# Patient Record
Sex: Male | Born: 1965
Health system: Southern US, Community
[De-identification: ages and names within clinical notes are randomized; demographics above are authoritative.]

## PROBLEM LIST (undated history)

## (undated) DIAGNOSIS — J302 Other seasonal allergic rhinitis: Secondary | ICD-10-CM

## (undated) DIAGNOSIS — K219 Gastro-esophageal reflux disease without esophagitis: Secondary | ICD-10-CM

## (undated) DIAGNOSIS — C801 Malignant (primary) neoplasm, unspecified: Secondary | ICD-10-CM

## (undated) DIAGNOSIS — I4510 Unspecified right bundle-branch block: Secondary | ICD-10-CM

## (undated) DIAGNOSIS — I10 Essential (primary) hypertension: Secondary | ICD-10-CM

## (undated) HISTORY — DX: Essential (primary) hypertension: I10

## (undated) HISTORY — DX: Unspecified right bundle-branch block: I45.10

## (undated) HISTORY — DX: Gastro-esophageal reflux disease without esophagitis: K21.9

## (undated) HISTORY — PX: KNEE ARTHROSCOPY: SUR90

## (undated) HISTORY — DX: Other seasonal allergic rhinitis: J30.2

---

## 2005-03-15 ENCOUNTER — Ambulatory Visit: Payer: Self-pay | Admitting: Family Medicine

## 2005-03-22 ENCOUNTER — Ambulatory Visit: Payer: Self-pay | Admitting: Family Medicine

## 2005-08-17 ENCOUNTER — Ambulatory Visit: Payer: Self-pay | Admitting: Family Medicine

## 2005-08-17 ENCOUNTER — Encounter: Admission: RE | Admit: 2005-08-17 | Discharge: 2005-08-17 | Payer: Self-pay | Admitting: Family Medicine

## 2005-12-28 ENCOUNTER — Ambulatory Visit: Payer: Self-pay | Admitting: Family Medicine

## 2006-12-09 ENCOUNTER — Ambulatory Visit: Payer: Self-pay | Admitting: Family Medicine

## 2006-12-09 LAB — CONVERTED CEMR LAB
ALT: 43 units/L — ABNORMAL HIGH (ref 0–40)
AST: 31 units/L (ref 0–37)
Albumin: 4.2 g/dL (ref 3.5–5.2)
Alkaline Phosphatase: 61 units/L (ref 39–117)
BUN: 14 mg/dL (ref 6–23)
Basophils Absolute: 0.1 10*3/uL (ref 0.0–0.1)
Basophils Relative: 0.8 % (ref 0.0–1.0)
Bilirubin, Direct: 0.1 mg/dL (ref 0.0–0.3)
CO2: 29 meq/L (ref 19–32)
Calcium: 9.1 mg/dL (ref 8.4–10.5)
Chloride: 108 meq/L (ref 96–112)
Cholesterol: 194 mg/dL (ref 0–200)
Creatinine, Ser: 1.2 mg/dL (ref 0.4–1.5)
Eosinophils Absolute: 0.4 10*3/uL (ref 0.0–0.6)
Eosinophils Relative: 4.5 % (ref 0.0–5.0)
GFR calc Af Amer: 85 mL/min
GFR calc non Af Amer: 71 mL/min
Glucose, Bld: 97 mg/dL (ref 70–99)
HCT: 43.8 % (ref 39.0–52.0)
HDL: 41.3 mg/dL (ref >39.0)
Hemoglobin: 15.2 g/dL (ref 13.0–17.0)
LDL Cholesterol: 132 mg/dL — ABNORMAL HIGH (ref 0–99)
Lymphocytes Relative: 26.1 % (ref 12.0–46.0)
MCHC: 34.8 g/dL (ref 30.0–36.0)
MCV: 82.3 fL (ref 78.0–100.0)
Monocytes Absolute: 0.7 10*3/uL (ref 0.2–0.7)
Monocytes Relative: 8.5 % (ref 3.0–11.0)
Neutro Abs: 4.9 10*3/uL (ref 1.4–7.7)
Neutrophils Relative %: 60.1 % (ref 43.0–77.0)
Platelets: 329 10*3/uL (ref 150–400)
Potassium: 4.1 meq/L (ref 3.5–5.1)
RBC: 5.32 M/uL (ref 4.22–5.81)
RDW: 12 % (ref 11.5–14.6)
Sodium: 142 meq/L (ref 135–145)
TSH: 2.38 microintl units/mL (ref 0.35–5.50)
Total Bilirubin: 0.8 mg/dL (ref 0.3–1.2)
Total CHOL/HDL Ratio: 4.7
Total Protein: 7.6 g/dL (ref 6.0–8.3)
Triglycerides: 104 mg/dL (ref 0–149)
VLDL: 21 mg/dL (ref 0–40)
WBC: 8.3 10*3/uL (ref 4.5–10.5)

## 2006-12-16 ENCOUNTER — Ambulatory Visit: Payer: Self-pay | Admitting: Family Medicine

## 2006-12-16 LAB — CONVERTED CEMR LAB: PSA: 1.21 ng/mL (ref 0.10–4.00)

## 2008-08-06 ENCOUNTER — Telehealth: Payer: Self-pay | Admitting: Family Medicine

## 2008-09-02 ENCOUNTER — Ambulatory Visit: Payer: Self-pay | Admitting: Family Medicine

## 2008-09-02 LAB — CONVERTED CEMR LAB
Bilirubin Urine: NEGATIVE
Glucose, Urine, Semiquant: NEGATIVE
Ketones, urine, test strip: NEGATIVE
Nitrite: NEGATIVE
Protein, U semiquant: NEGATIVE
Specific Gravity, Urine: 1.02
Urobilinogen, UA: 0.2
WBC Urine, dipstick: NEGATIVE
pH: 6

## 2008-09-07 ENCOUNTER — Ambulatory Visit: Payer: Self-pay | Admitting: Family Medicine

## 2008-09-07 DIAGNOSIS — I10 Essential (primary) hypertension: Secondary | ICD-10-CM | POA: Insufficient documentation

## 2008-09-07 DIAGNOSIS — R7309 Other abnormal glucose: Secondary | ICD-10-CM | POA: Insufficient documentation

## 2008-09-07 DIAGNOSIS — E785 Hyperlipidemia, unspecified: Secondary | ICD-10-CM | POA: Insufficient documentation

## 2008-09-07 DIAGNOSIS — L408 Other psoriasis: Secondary | ICD-10-CM | POA: Insufficient documentation

## 2008-09-07 LAB — CONVERTED CEMR LAB
ALT: 48 units/L (ref 0–53)
AST: 32 units/L (ref 0–37)
Albumin: 4.1 g/dL (ref 3.5–5.2)
Alkaline Phosphatase: 72 units/L (ref 39–117)
BUN: 16 mg/dL (ref 6–23)
Basophils Absolute: 0 10*3/uL (ref 0.0–0.1)
Basophils Relative: 0.3 % (ref 0.0–3.0)
Bilirubin, Direct: 0.1 mg/dL (ref 0.0–0.3)
CO2: 30 meq/L (ref 19–32)
Calcium: 9.1 mg/dL (ref 8.4–10.5)
Chloride: 105 meq/L (ref 96–112)
Cholesterol: 212 mg/dL (ref 0–200)
Creatinine, Ser: 1.1 mg/dL (ref 0.4–1.5)
Direct LDL: 141.2 mg/dL
Eosinophils Absolute: 0.4 10*3/uL (ref 0.0–0.7)
Eosinophils Relative: 5.9 % — ABNORMAL HIGH (ref 0.0–5.0)
GFR calc Af Amer: 94 mL/min
GFR calc non Af Amer: 77 mL/min
Glucose, Bld: 115 mg/dL — ABNORMAL HIGH (ref 70–99)
HCT: 41.7 % (ref 39.0–52.0)
HDL: 41.5 mg/dL (ref >39.0)
Hemoglobin: 14.8 g/dL (ref 13.0–17.0)
Lymphocytes Relative: 29.1 % (ref 12.0–46.0)
MCHC: 35.4 g/dL (ref 30.0–36.0)
MCV: 83.6 fL (ref 78.0–100.0)
Monocytes Absolute: 0.6 10*3/uL (ref 0.1–1.0)
Monocytes Relative: 8.9 % (ref 3.0–12.0)
Neutro Abs: 3.8 10*3/uL (ref 1.4–7.7)
Neutrophils Relative %: 55.8 % (ref 43.0–77.0)
Platelets: 296 10*3/uL (ref 150–400)
Potassium: 4.2 meq/L (ref 3.5–5.1)
RBC: 4.99 M/uL (ref 4.22–5.81)
RDW: 12.6 % (ref 11.5–14.6)
Sodium: 142 meq/L (ref 135–145)
TSH: 3.4 microintl units/mL (ref 0.35–5.50)
Total Bilirubin: 1 mg/dL (ref 0.3–1.2)
Total CHOL/HDL Ratio: 5.1
Total Protein: 7.8 g/dL (ref 6.0–8.3)
Triglycerides: 81 mg/dL (ref 0–149)
VLDL: 16 mg/dL (ref 0–40)
WBC: 6.8 10*3/uL (ref 4.5–10.5)

## 2009-07-07 ENCOUNTER — Ambulatory Visit: Payer: Self-pay | Admitting: Family Medicine

## 2009-07-07 DIAGNOSIS — L259 Unspecified contact dermatitis, unspecified cause: Secondary | ICD-10-CM

## 2009-07-07 DIAGNOSIS — K602 Anal fissure, unspecified: Secondary | ICD-10-CM

## 2009-10-17 ENCOUNTER — Telehealth: Payer: Self-pay | Admitting: Family Medicine

## 2009-10-18 ENCOUNTER — Ambulatory Visit: Payer: Self-pay | Admitting: Family Medicine

## 2009-10-18 DIAGNOSIS — R109 Unspecified abdominal pain: Secondary | ICD-10-CM

## 2009-10-18 LAB — CONVERTED CEMR LAB
Glucose, Urine, Semiquant: NEGATIVE
Nitrite: NEGATIVE
Protein, U semiquant: 30
Specific Gravity, Urine: 1.01
Urobilinogen, UA: 2
WBC Urine, dipstick: NEGATIVE
pH: 6.5

## 2009-10-25 LAB — CONVERTED CEMR LAB
ALT: 54 units/L — ABNORMAL HIGH (ref 0–53)
AST: 46 units/L — ABNORMAL HIGH (ref 0–37)
Albumin: 3.5 g/dL (ref 3.5–5.2)
Alkaline Phosphatase: 87 units/L (ref 39–117)
Amylase: 69 units/L (ref 27–131)
BUN: 7 mg/dL (ref 6–23)
Basophils Absolute: 0 10*3/uL (ref 0.0–0.1)
Basophils Relative: 0.2 % (ref 0.0–3.0)
Bilirubin, Direct: 0.3 mg/dL (ref 0.0–0.3)
CO2: 28 meq/L (ref 19–32)
Calcium: 8.9 mg/dL (ref 8.4–10.5)
Chloride: 103 meq/L (ref 96–112)
Cholesterol: 153 mg/dL (ref 0–200)
Creatinine, Ser: 0.9 mg/dL (ref 0.4–1.5)
Eosinophils Absolute: 0.2 10*3/uL (ref 0.0–0.7)
Eosinophils Relative: 1.3 % (ref 0.0–5.0)
GFR calc non Af Amer: 96.63 mL/min (ref >60)
Glucose, Bld: 93 mg/dL (ref 70–99)
HCT: 41 % (ref 39.0–52.0)
HDL: 45.7 mg/dL (ref >39.00)
Hemoglobin: 14 g/dL (ref 13.0–17.0)
LDL Cholesterol: 94 mg/dL (ref 0–99)
Lymphocytes Relative: 14.6 % (ref 12.0–46.0)
Lymphs Abs: 2 10*3/uL (ref 0.7–4.0)
MCHC: 34.1 g/dL (ref 30.0–36.0)
MCV: 86.3 fL (ref 78.0–100.0)
Monocytes Absolute: 1.6 10*3/uL — ABNORMAL HIGH (ref 0.1–1.0)
Monocytes Relative: 11.5 % (ref 3.0–12.0)
Neutro Abs: 9.7 10*3/uL — ABNORMAL HIGH (ref 1.4–7.7)
Neutrophils Relative %: 72.4 % (ref 43.0–77.0)
Platelets: 321 10*3/uL (ref 150.0–400.0)
Potassium: 3.9 meq/L (ref 3.5–5.1)
RBC: 4.76 M/uL (ref 4.22–5.81)
RDW: 11.9 % (ref 11.5–14.6)
Sodium: 139 meq/L (ref 135–145)
TSH: 2.13 microintl units/mL (ref 0.35–5.50)
Total Bilirubin: 1.3 mg/dL — ABNORMAL HIGH (ref 0.3–1.2)
Total CHOL/HDL Ratio: 3
Total Protein: 7.7 g/dL (ref 6.0–8.3)
Triglycerides: 66 mg/dL (ref 0.0–149.0)
VLDL: 13.2 mg/dL (ref 0.0–40.0)
WBC: 13.5 10*3/uL — ABNORMAL HIGH (ref 4.5–10.5)

## 2010-03-30 ENCOUNTER — Ambulatory Visit: Payer: Self-pay | Admitting: Family Medicine

## 2010-03-30 LAB — CONVERTED CEMR LAB
Bilirubin Urine: NEGATIVE
Glucose, Urine, Semiquant: NEGATIVE
Ketones, urine, test strip: NEGATIVE
Nitrite: NEGATIVE
Specific Gravity, Urine: 1.025
Urobilinogen, UA: 0.2
WBC Urine, dipstick: NEGATIVE
pH: 6

## 2010-03-31 LAB — CONVERTED CEMR LAB
ALT: 27 units/L (ref 0–53)
AST: 25 units/L (ref 0–37)
Albumin: 4 g/dL (ref 3.5–5.2)
Alkaline Phosphatase: 60 units/L (ref 39–117)
BUN: 17 mg/dL (ref 6–23)
Basophils Absolute: 0.1 10*3/uL (ref 0.0–0.1)
Basophils Relative: 0.6 % (ref 0.0–3.0)
Bilirubin, Direct: 0.2 mg/dL (ref 0.0–0.3)
CO2: 23 meq/L (ref 19–32)
Calcium: 9 mg/dL (ref 8.4–10.5)
Chloride: 104 meq/L (ref 96–112)
Cholesterol: 201 mg/dL — ABNORMAL HIGH (ref 0–200)
Creatinine, Ser: 0.9 mg/dL (ref 0.4–1.5)
Direct LDL: 142.8 mg/dL
Eosinophils Absolute: 0.4 10*3/uL (ref 0.0–0.7)
Eosinophils Relative: 4.6 % (ref 0.0–5.0)
GFR calc non Af Amer: 100.29 mL/min (ref >60)
Glucose, Bld: 105 mg/dL — ABNORMAL HIGH (ref 70–99)
HCT: 43.1 % (ref 39.0–52.0)
HDL: 48.4 mg/dL (ref >39.00)
Hemoglobin: 14.8 g/dL (ref 13.0–17.0)
Lymphocytes Relative: 29.7 % (ref 12.0–46.0)
Lymphs Abs: 2.5 10*3/uL (ref 0.7–4.0)
MCHC: 34.3 g/dL (ref 30.0–36.0)
MCV: 85.8 fL (ref 78.0–100.0)
Monocytes Absolute: 0.8 10*3/uL (ref 0.1–1.0)
Monocytes Relative: 9.2 % (ref 3.0–12.0)
Neutro Abs: 4.7 10*3/uL (ref 1.4–7.7)
Neutrophils Relative %: 55.9 % (ref 43.0–77.0)
PSA: 1.32 ng/mL (ref 0.10–4.00)
Platelets: 316 10*3/uL (ref 150.0–400.0)
Potassium: 4.2 meq/L (ref 3.5–5.1)
RBC: 5.02 M/uL (ref 4.22–5.81)
RDW: 13 % (ref 11.5–14.6)
Sodium: 138 meq/L (ref 135–145)
TSH: 2.09 microintl units/mL (ref 0.35–5.50)
Total Bilirubin: 0.8 mg/dL (ref 0.3–1.2)
Total CHOL/HDL Ratio: 4
Total Protein: 7.1 g/dL (ref 6.0–8.3)
Triglycerides: 88 mg/dL (ref 0.0–149.0)
VLDL: 17.6 mg/dL (ref 0.0–40.0)
WBC: 8.3 10*3/uL (ref 4.5–10.5)

## 2010-04-06 ENCOUNTER — Ambulatory Visit: Payer: Self-pay | Admitting: Family Medicine

## 2010-04-06 DIAGNOSIS — L909 Atrophic disorder of skin, unspecified: Secondary | ICD-10-CM

## 2010-04-06 DIAGNOSIS — L919 Hypertrophic disorder of the skin, unspecified: Secondary | ICD-10-CM

## 2010-04-06 DIAGNOSIS — L989 Disorder of the skin and subcutaneous tissue, unspecified: Secondary | ICD-10-CM | POA: Insufficient documentation

## 2010-12-05 NOTE — Assessment & Plan Note (Signed)
Summary: CPX/CCM   Vital Signs:  Patient Profile:   45 Years Old Male Height:     68 inches Weight:      237 pounds BMI:     36.17 Temp:     98.0 degrees F oral Pulse rate:   78 / minute BP sitting:   132 / 82  (left arm) Cuff size:   large  Vitals Entered By: Alfred Levins, CMA (September 07, 2008 10:33 AM)                 Chief Complaint:  cpx.  History of Present Illness: 45 yr old male for cpx. Feels good. Says he watches what he eats, but it is apparent that he has a very poor diet. He walks on a treadmill 2 days a week.     Current Allergies (reviewed today): ! PENICILLIN  Past Medical History:    Reviewed history and no changes required:       Hypertension  Past Surgical History:    Reviewed history and no changes required:       Arthoscopy rt knee   Family History:    Reviewed history and no changes required:       Family History of CAD Male 1st degree relative <50       Family History Diabetes 1st degree relative       Family History High cholesterol       Family History Hypertension       Family History Lung cancer       Family History of Prostate CA 1st degree relative <50       Family History of Stroke M 1st degree relative <50  Social History:    Reviewed history and no changes required:       Married       Never Smoked       Alcohol use-yes       Drug use-no       Chews tobacco   Risk Factors:  Tobacco use:  never Drug use:  no Alcohol use:  yes   Review of Systems  The patient denies anorexia, fever, weight loss, weight gain, vision loss, decreased hearing, hoarseness, chest pain, syncope, dyspnea on exertion, peripheral edema, prolonged cough, headaches, hemoptysis, abdominal pain, melena, hematochezia, severe indigestion/heartburn, hematuria, incontinence, genital sores, muscle weakness, suspicious skin lesions, transient blindness, difficulty walking, depression, unusual weight change, abnormal bleeding, enlarged lymph nodes,  angioedema, breast masses, and testicular masses.     Physical Exam  General:     overweight-appearing.   Head:     Normocephalic and atraumatic without obvious abnormalities. No apparent alopecia or balding. Eyes:     No corneal or conjunctival inflammation noted. EOMI. Perrla. Funduscopic exam benign, without hemorrhages, exudates or papilledema. Vision grossly normal. Ears:     External ear exam shows no significant lesions or deformities.  Otoscopic examination reveals clear canals, tympanic membranes are intact bilaterally without bulging, retraction, inflammation or discharge. Hearing is grossly normal bilaterally. Nose:     External nasal examination shows no deformity or inflammation. Nasal mucosa are pink and moist without lesions or exudates. Mouth:     Oral mucosa and oropharynx without lesions or exudates.  Teeth in good repair. Neck:     No deformities, masses, or tenderness noted. Chest Wall:     No deformities, masses, tenderness or gynecomastia noted. Lungs:     Normal respiratory effort, chest expands symmetrically. Lungs are clear to auscultation, no crackles or  wheezes. Heart:     Normal rate and regular rhythm. S1 and S2 normal without gallop, murmur, click, rub or other extra sounds. Abdomen:     Bowel sounds positive,abdomen soft and non-tender without masses, organomegaly or hernias noted. Rectal:     No external abnormalities noted. Normal sphincter tone. No rectal masses or tenderness. Genitalia:     Testes bilaterally descended without nodularity, tenderness or masses. No scrotal masses or lesions. No penis lesions or urethral discharge. Prostate:     Prostate gland firm and smooth, no enlargement, nodularity, tenderness, mass, asymmetry or induration. Msk:     No deformity or scoliosis noted of thoracic or lumbar spine.   Pulses:     R and L carotid,radial,femoral,dorsalis pedis and posterior tibial pulses are full and equal bilaterally Extremities:      No clubbing, cyanosis, edema, or deformity noted with normal full range of motion of all joints.   Neurologic:     No cranial nerve deficits noted. Station and gait are normal. Plantar reflexes are down-going bilaterally. DTRs are symmetrical throughout. Sensory, motor and coordinative functions appear intact. Skin:     Intact without suspicious lesions or rashes Cervical Nodes:     No lymphadenopathy noted Axillary Nodes:     No palpable lymphadenopathy Inguinal Nodes:     No significant adenopathy Psych:     Cognition and judgment appear intact. Alert and cooperative with normal attention span and concentration. No apparent delusions, illusions, hallucinations    Impression & Recommendations:  Problem # 1:  WELL ADULT EXAM (ICD-V70.0)  Problem # 2:  HYPERLIPIDEMIA (ICD-272.4)  Orders: Nutrition Referral (Nutrition)   Problem # 3:  HYPERGLYCEMIA (ICD-790.29)  Problem # 4:  PSORIASIS (ICD-696.1)  Complete Medication List: 1)  Norvasc 5 Mg Tabs (Amlodipine besylate) .Marland Kitchen.. 1 by mouth once daily 2)  Fluticasone Propionate 0.005 % Oint (Fluticasone propionate) .... Apply two times a day as needed rash  Other Orders: Venipuncture (60454) TLB-PSA (Prostate Specific Antigen) (84153-PSA)   Patient Instructions: 1)  It is important that you exercise regularly at least 20 minutes 5 times a week. If you develop chest pain, have severe difficulty breathing, or feel very tired , stop exercising immediately and seek medical attention. 2)  You need to lose weight. Consider a lower calorie diet and regular exercise.  3)  Will refer him to Nutrition for counseling.  4)  Please schedule a follow-up appointment in 6 months.   Prescriptions: FLUTICASONE PROPIONATE 0.005 % OINT (FLUTICASONE PROPIONATE) apply two times a day as needed rash  #60 grams x 3   Entered and Authorized by:   Nelwyn Salisbury MD   Signed by:   Nelwyn Salisbury MD on 09/07/2008   Method used:   Print then Give to  Patient   RxID:   [id number] NORVASC 5 MG TABS (AMLODIPINE BESYLATE) 1 by mouth once daily  #90 x 3   Entered and Authorized by:   Nelwyn Salisbury MD   Signed by:   Nelwyn Salisbury MD on 09/07/2008   Method used:   Print then Give to Patient   RxID:   [id number]  ]

## 2010-12-05 NOTE — Progress Notes (Signed)
Summary: FYI  Phone Note Call from Patient Call back at Home Phone [phone]   Caller: Spouse Call For: fry Summary of Call: pt is having pain between the navel and the pubic bone, fever, n/v, belching.  It is in the center of abd.  It has been off and on since May but has been steady for the past 2 wks.  Pts wife may take him to the ER but if she doesn't then an appt has been scheduled for 9:30 toworrow Initial call taken by: Alfred Levins, CMA,  October 17, 2009 4:25 PM  Follow-up for Phone Call        agreed Follow-up by: Nelwyn Salisbury MD,  October 17, 2009 5:22 PM

## 2010-12-05 NOTE — Progress Notes (Signed)
Summary: REFILL  Phone Note Call from Patient Call back at [phone]   Caller: Patient[facility] CALL Call For: DR FRY Reason for Call: Refill Medication Summary of Call: PT HAS CPX ON 09/07/2008. PLEASE REFILL NORVASC 5MG  1QD UNTIL APPT. FACX RX TO [facility] AT [phone]. PLEASE LET PT KNOW WHEN DONE. CHECK HARD CHART FOR MED. LIST. THANKS. Initial call taken by: Warnell Forester,  August 06, 2008 9:05 AM  Follow-up for Phone Call        Phone Call Completed Follow-up by: Alfred Levins, CMA,  August 06, 2008 11:07 AM    New/Updated Medications: NORVASC 5 MG TABS (AMLODIPINE BESYLATE) 1 by mouth once daily needs ov   Prescriptions: NORVASC 5 MG TABS (AMLODIPINE BESYLATE) 1 by mouth once daily needs ov  #90 x 0   Entered by:   Alfred Levins, CMA   Authorized by:   Nelwyn Salisbury MD   Signed by:   Alfred Levins, CMA on 08/06/2008   Method used:   Electronically to        [facility]* (mail-order)             ,          Ph: [phone]       Fax: [phone]   RxID:   [id number]

## 2010-12-05 NOTE — Assessment & Plan Note (Signed)
Summary: f/v/cdw   Vital Signs:  Patient profile:   45 year old male Weight:      224.5 pounds Temp:     98.1 degrees F oral Pulse rate:   99 / minute BP sitting:   124 / 80  (left arm) Cuff size:   large  Vitals Entered By: Alfred Levins, CMA (October 18, 2009 9:35 AM) CC: sharp abd pain by the umbilical area, n/v on saturday   History of Present Illness: Here for 4 days of lower abdominal pains and low grade fever. His temp was 99.9 degrees at one point. The pain waxes and wanes, it is sharp and sometimes severe, it is located mostly above the pubis but can involve both lower quadrants as well. No back pain. Sometimes he has increased frequency of urination but no burning or blood seen. His BMs are softer than normal but not watery, he is having 5 to 6 BMs a day. No black or bloody stools. His appetite is down somewhat. He had nause and vomitting one day at the beginning but none lately. Motrin 800 mg helps the pain for a few hours, as does sitting in a hot bathtub. Drinking lots of fluids.   Current Medications (verified): 1)  Norvasc 5 Mg Tabs (Amlodipine Besylate) .Marland Kitchen.. 1 By Mouth Once Daily  Allergies (verified): 1)  ! Penicillin  Past History:  Past Medical History: Reviewed history from 09/07/2008 and no changes required. Hypertension  Past Surgical History: Reviewed history from 09/07/2008 and no changes required. Arthoscopy rt knee  Review of Systems  The patient denies anorexia, weight loss, weight gain, vision loss, decreased hearing, hoarseness, chest pain, syncope, dyspnea on exertion, peripheral edema, prolonged cough, headaches, hemoptysis, melena, hematochezia, severe indigestion/heartburn, hematuria, incontinence, genital sores, muscle weakness, suspicious skin lesions, transient blindness, difficulty walking, depression, unusual weight change, abnormal bleeding, enlarged lymph nodes, angioedema, breast masses, and testicular masses.    Physical  Exam  General:  in mild distress Lungs:  Normal respiratory effort, chest expands symmetrically. Lungs are clear to auscultation, no crackles or wheezes. Heart:  Normal rate and regular rhythm. S1 and S2 normal without gallop, murmur, click, rub or other extra sounds. Abdomen:  soft, normal bowel sounds, no distention, no masses, no guarding, no rigidity, no rebound tenderness, no abdominal hernia, no inguinal hernia, no hepatomegaly, and no splenomegaly.  Moderately tender in both lower quadrants and above the pubis. Rectal:  No external abnormalities noted. Normal sphincter tone. No rectal masses or tenderness. Heme negative Genitalia:  Testes bilaterally descended without nodularity, tenderness or masses. No scrotal masses or lesions. No penis lesions or urethral discharge. Prostate:  no gland enlargement and no nodules.  Slightly tender, not boggy Inguinal Nodes:  No significant adenopathy   Impression & Recommendations:  Problem # 1:  ABDOMINAL PAIN, UNSPECIFIED SITE (ICD-789.00)  Orders: UA Dipstick w/o Micro (manual) (56213) Venipuncture (08657) TLB-BMP (Basic Metabolic Panel-BMET) (80048-METABOL) TLB-CBC Platelet - w/Differential (85025-CBCD) TLB-Hepatic/Liver Function Pnl (80076-HEPATIC) TLB-TSH (Thyroid Stimulating Hormone) (84443-TSH) TLB-Amylase (82150-AMYL)  Complete Medication List: 1)  Norvasc 5 Mg Tabs (Amlodipine besylate) .Marland Kitchen.. 1 by mouth once daily 2)  Cipro 500 Mg Tabs (Ciprofloxacin hcl) .... Two times a day  Other Orders: TLB-Lipid Panel (80061-LIPID)  Patient Instructions: 1)  This is consistent with prostatitis. Given a course of Cipro. Get labs.  Prescriptions: CIPRO 500 MG TABS (CIPROFLOXACIN HCL) two times a day  #28 x 0   Entered and Authorized by:   Nelwyn Salisbury MD  Signed by:   Nelwyn Salisbury MD on 10/18/2009   Method used:   Electronically to        [facility]  [place] [address] (retail)       [address]       [place], [place]  [address]       Ph:  [phone] or [phone]       Fax: [phone]   RxID:   [id number]   Laboratory Results   Urine Tests  Date/Time Received: October 18, 2009 10:16 AM Date/Time Reported: October 18, 2009 10:16 AM  Routine Urinalysis   Color: red Appearance: Clear Glucose: negative   (Normal Range: Negative) Bilirubin: small   (Normal Range: Negative) Ketone: large (80)   (Normal Range: Negative) Spec. Gravity: 1.010   (Normal Range: 1.003-1.035) Blood: moderate   (Normal Range: Negative) pH: 6.5   (Normal Range: 5.0-8.0) Protein: 30   (Normal Range: Negative) Urobilinogen: 2.0   (Normal Range: 0-1) Nitrite: negative   (Normal Range: Negative) Leukocyte Esterace: negative   (Normal Range: Negative)    Comments: Alfred Levins, CMA  October 18, 2009 10:17 AM

## 2010-12-05 NOTE — Assessment & Plan Note (Signed)
Summary: constipation/njr   Vital Signs:  Patient profile:   45 year old male Weight:      227 pounds BMI:     34.64 Temp:     98.0 degrees F oral Pulse rate:   88 / minute BP sitting:   132 / 88  (left arm) Cuff size:   large  Vitals Entered By: Alfred Levins, CMA (July 07, 2009 11:21 AM) CC: bottom is sore   History of Present Illness: Here for 4 days of anal pain which gets worse when he has a BM. No bleeding. Also he has had itchy poison ivy rashes on the lower legs for a week.   Allergies: 1)  ! Penicillin  Past History:  Past Medical History: Reviewed history from 09/07/2008 and no changes required. Hypertension  Review of Systems  The patient denies anorexia, fever, weight loss, weight gain, vision loss, decreased hearing, hoarseness, chest pain, syncope, dyspnea on exertion, peripheral edema, prolonged cough, headaches, hemoptysis, abdominal pain, melena, hematochezia, severe indigestion/heartburn, hematuria, incontinence, genital sores, muscle weakness, suspicious skin lesions, transient blindness, difficulty walking, depression, unusual weight change, abnormal bleeding, enlarged lymph nodes, angioedema, breast masses, and testicular masses.    Physical Exam  General:  Well-developed,well-nourished,in no acute distress; alert,appropriate and cooperative throughout examination Rectal:  has a small fissure on the anal verge Skin:  scattered red papuovessicular patches around the ankles   Impression & Recommendations:  Problem # 1:  CONTACT DERMATITIS (ICD-692.9)  His updated medication list for this problem includes:    Fluticasone Propionate 0.005 % Oint (Fluticasone propionate) .Marland Kitchen... Apply two times a day as needed rash    Pramosone 1-2.5 % Oint (Pramoxine-hc) .Marland Kitchen... Apply three times a day as needed  Orders: Depo- Medrol 80mg  (J1040) Admin of Therapeutic Inj  intramuscular or subcutaneous (03474)  Problem # 2:  ANAL FISSURE (ICD-565.0)  Complete  Medication List: 1)  Norvasc 5 Mg Tabs (Amlodipine besylate) .Marland Kitchen.. 1 by mouth once daily 2)  Fluticasone Propionate 0.005 % Oint (Fluticasone propionate) .... Apply two times a day as needed rash 3)  Pramosone 1-2.5 % Oint (Pramoxine-hc) .... Apply three times a day as needed  Patient Instructions: 1)  Please schedule a follow-up appointment as needed . try Miralax to soften the stools Prescriptions: PRAMOSONE 1-2.5 % OINT (PRAMOXINE-HC) apply three times a day as needed  #30 grams x 2   Entered and Authorized by:   Nelwyn Salisbury MD   Signed by:   Nelwyn Salisbury MD on 07/07/2009   Method used:   [facility]        [facility]  [place] [address]       [address]       [place], [place]  [address]       Ph: [phone] or [phone]       Fax: [phone]   RxID:   [id number]    Medication Administration  Injection # 1:    Medication: Depo- Medrol 80mg     Diagnosis: CONTACT DERMATITIS (ICD-692.9)    Route: IM    Site: RUOQ gluteus    Exp Date: 11/12    Lot #: [id number]    Mfr: [employer]    Comments: 120mg     Patient tolerated injection without complications    Given by: Alfred Levins, CMA (July 07, 2009 12:09 PM)  Orders Added: 1)  Est. Patient Level IV [31517] 2)  Depo- Medrol 80mg  [J1040] 3)  Admin of Therapeutic Inj  intramuscular or subcutaneous [47829]

## 2010-12-05 NOTE — Letter (Signed)
Summary: [facility]   [facility]   Imported By: Maryln Gottron 09/10/2008 14:17:56  _____________________________________________________________________  External Attachment:    Type:   Image     Comment:   External Document

## 2010-12-05 NOTE — Assessment & Plan Note (Signed)
Summary: CPX/NJR---PT [facility] // RS   Vital Signs:  Patient profile:   45 year old male Height:      67 inches Weight:      223 pounds BMI:     35.05 BP sitting:   160 / 100  (left arm) Cuff size:   regular  Vitals Entered By: Raechel Ache, RN (April 06, 2010 9:55 AM) CC: CPX, labs done.   History of Present Illness: 45 yr old male for a cpx. he feels fine and has onoy one issue to bring up today. For several years he has had a small tag on his neck which rubs on his clothes and gets sore. He did not take his Norvasc yet today, and I think that is why his BP is up. Also he admits to eating a lot of fast foods in the past 6 months, and this is probably why his glucose and chol. levels are up as noted below.   Allergies: 1)  ! Penicillin  Past History:  Past Medical History: Reviewed history from 09/07/2008 and no changes required. Hypertension  Past Surgical History: Arthoscopy right knee  Family History: Reviewed history from 09/07/2008 and no changes required. Family History of CAD Male 1st degree relative <50 Family History Diabetes 1st degree relative Family History High cholesterol Family History Hypertension Family History Lung cancer prostate cancer in his father and brother Family History of Stroke M 1st degree relative <50  Social History: Reviewed history from 09/07/2008 and no changes required. Married Never Smoked Alcohol use-yes Drug use-no Chews tobacco  Review of Systems  The patient denies anorexia, fever, weight loss, weight gain, vision loss, decreased hearing, hoarseness, chest pain, syncope, dyspnea on exertion, peripheral edema, prolonged cough, headaches, hemoptysis, abdominal pain, melena, hematochezia, severe indigestion/heartburn, hematuria, incontinence, genital sores, muscle weakness, suspicious skin lesions, transient blindness, difficulty walking, depression, unusual weight change, abnormal bleeding, enlarged lymph nodes, angioedema, breast  masses, and testicular masses.    Physical Exam  General:  overweight-appearing.   Head:  Normocephalic and atraumatic without obvious abnormalities. No apparent alopecia or balding. Eyes:  No corneal or conjunctival inflammation noted. EOMI. Perrla. Funduscopic exam benign, without hemorrhages, exudates or papilledema. Vision grossly normal. Ears:  External ear exam shows no significant lesions or deformities.  Otoscopic examination reveals clear canals, tympanic membranes are intact bilaterally without bulging, retraction, inflammation or discharge. Hearing is grossly normal bilaterally. Nose:  External nasal examination shows no deformity or inflammation. Nasal mucosa are pink and moist without lesions or exudates. Mouth:  Oral mucosa and oropharynx without lesions or exudates.  Teeth in good repair. Neck:  No deformities, masses, or tenderness noted. Chest Wall:  No deformities, masses, tenderness or gynecomastia noted. Lungs:  Normal respiratory effort, chest expands symmetrically. Lungs are clear to auscultation, no crackles or wheezes. Heart:  Normal rate and regular rhythm. S1 and S2 normal without gallop, murmur, click, rub or other extra sounds. Abdomen:  Bowel sounds positive,abdomen soft and non-tender without masses, organomegaly or hernias noted. Rectal:  No external abnormalities noted. Normal sphincter tone. No rectal masses or tenderness. Genitalia:  Testes bilaterally descended without nodularity, tenderness or masses. No scrotal masses or lesions. No penis lesions or urethral discharge. Prostate:  Prostate gland firm and smooth, no enlargement, nodularity, tenderness, mass, asymmetry or induration. Msk:  No deformity or scoliosis noted of thoracic or lumbar spine.   Pulses:  R and L carotid,radial,femoral,dorsalis pedis and posterior tibial pulses are full and equal bilaterally Extremities:  No  clubbing, cyanosis, edema, or deformity noted with normal full range of motion of  all joints.   Neurologic:  No cranial nerve deficits noted. Station and gait are normal. Plantar reflexes are down-going bilaterally. DTRs are symmetrical throughout. Sensory, motor and coordinative functions appear intact. Skin:  Intact without suspicious lesions or rashes. He has a single skin tag on the left neck Cervical Nodes:  No lymphadenopathy noted Axillary Nodes:  No palpable lymphadenopathy Inguinal Nodes:  No significant adenopathy Psych:  Cognition and judgment appear intact. Alert and cooperative with normal attention span and concentration. No apparent delusions, illusions, hallucinations   Impression & Recommendations:  Problem # 1:  WELL ADULT EXAM (ICD-V70.0)  Problem # 2:  SKIN TAG (ICD-701.9)  Orders: Removal of Skin Tags up to 15 Lesions (11200)  Complete Medication List: 1)  Norvasc 5 Mg Tabs (Amlodipine besylate) .Marland Kitchen.. 1 by mouth once daily  Patient Instructions: 1)  Please schedule a follow-up appointment in 6 months .  2)  It is important that you exercise reguarly at least 20 minutes 5 times a week. If you develop chest pain, have severe difficulty breathing, or feel very tired, stop exercising immediately and seek medical attention.  3)  You need to lose weight. Consider a lower calorie diet and regular exercise.  Prescriptions: NORVASC 5 MG TABS (AMLODIPINE BESYLATE) 1 by mouth once daily  #90 x 3   Entered and Authorized by:   Nelwyn Salisbury MD   Signed by:   Nelwyn Salisbury MD on 04/06/2010   Method used:   Print then Give to Patient   RxID:   [id number]

## 2011-01-06 ENCOUNTER — Other Ambulatory Visit: Payer: Self-pay | Admitting: Family Medicine

## 2011-03-23 NOTE — Assessment & Plan Note (Signed)
[facility] OFFICE NOTE   Micheal, Dominguez                       MRN:          [id number]  DATE:12/16/2006                            DOB:          Dec 20, 1965    This is a 45 year old gentleman here for a complete physical  examination.  In general, he is doing well and feels fine.  He has been  watching his diet and exercising.  I believe he has lost about 10 pounds  over the past year.  He is requesting a prostate examination, however.  We knew that his father had a history of prostate cancer.  Apparently,  his brother has recently been diagnosed with prostate cancer as well.  For other details of his past medical history, family history, social  history, habits, Micheal Dominguez, I refer you to our introductory note with  him dated Mar 22, 2005.   ALLERGIES:  PENICILLIN.   CURRENT MEDICATIONS:  Norvasc 5 mg per day.   OBJECTIVE:  Height 5 feet 8 inches, weight 230, BP 108/92, pulse 76 and  regular.  GENERAL:  He is a little overweight.  SKIN:  Clear.  EYES:  Clear.  Sclerae and oropharynx clear.  NECK:  Supple without lymphadenopathy or masses.  LUNGS:  Clear.  CARDIAC:  Rate and rhythm are regular without gallops, murmurs, or rubs.  Distal pulses full.  ABDOMEN:  Soft with normal bowel sounds.  Nontender.  No masses.  GENITALIA:  Normal male.  He is circumcised.  RECTAL:  No masses or tenderness.  Prostate is within normal limits.  EXTREMITIES:  No cyanosis, clubbing, or edema.  NEUROLOGIC:  Grossly intact.   He was here for fasting laboratories on February 4.  These were all  within normal limits except for a slightly abnormal lipid panel, HDL was  good at 41, but LDL was a bit high at 132.  A PSA was not performed.   ASSESSMENT AND PLAN:  Problem #1:  Complete physical.  I encouraged him  to continue with his efforts of diet and exercise.  Problem #2:  Hypertension, probably stable.  Will continue his  current  medications.  Problem #3:  Family history of prostate cancer.  Will get a PSA drawn  today.     Tera Mater. Clent Ridges, MD  Electronically Signed    SAF/[facility]  DD: 12/16/2006  DT: 12/16/2006  Job #: [id number]

## 2011-05-30 ENCOUNTER — Other Ambulatory Visit: Payer: Self-pay

## 2011-06-05 ENCOUNTER — Encounter: Payer: Self-pay | Admitting: Family Medicine

## 2011-06-06 ENCOUNTER — Ambulatory Visit: Payer: BC Managed Care – PPO

## 2011-06-06 DIAGNOSIS — Z0000 Encounter for general adult medical examination without abnormal findings: Secondary | ICD-10-CM

## 2011-06-06 LAB — CBC WITH DIFFERENTIAL/PLATELET
Basophils Absolute: 0.1 10*3/uL (ref 0.0–0.1)
Basophils Relative: 0.8 % (ref 0.0–3.0)
Eosinophils Absolute: 0.4 10*3/uL (ref 0.0–0.7)
Eosinophils Relative: 4.1 % (ref 0.0–5.0)
HCT: 42 % (ref 39.0–52.0)
Hemoglobin: 14 g/dL (ref 13.0–17.0)
Lymphocytes Relative: 26.8 % (ref 12.0–46.0)
Lymphs Abs: 2.4 10*3/uL (ref 0.7–4.0)
MCHC: 33.3 g/dL (ref 30.0–36.0)
MCV: 87.4 fl (ref 78.0–100.0)
Monocytes Absolute: 0.7 10*3/uL (ref 0.1–1.0)
Monocytes Relative: 8.4 % (ref 3.0–12.0)
Neutro Abs: 5.3 10*3/uL (ref 1.4–7.7)
Neutrophils Relative %: 59.9 % (ref 43.0–77.0)
Platelets: 324 10*3/uL (ref 150.0–400.0)
RBC: 4.8 Mil/uL (ref 4.22–5.81)
RDW: 12.8 % (ref 11.5–14.6)
WBC: 8.9 10*3/uL (ref 4.5–10.5)

## 2011-06-06 LAB — LIPID PANEL
Cholesterol: 184 mg/dL (ref 0–200)
HDL: 52.4 mg/dL (ref >39.00)
LDL Cholesterol: 111 mg/dL — ABNORMAL HIGH (ref 0–99)
Total CHOL/HDL Ratio: 4
Triglycerides: 104 mg/dL (ref 0.0–149.0)
VLDL: 20.8 mg/dL (ref 0.0–40.0)

## 2011-06-06 LAB — POCT URINALYSIS DIPSTICK
Bilirubin, UA: NEGATIVE
Blood, UA: NEGATIVE
Glucose, UA: NEGATIVE
Ketones, UA: NEGATIVE
Leukocytes, UA: NEGATIVE
Nitrite, UA: NEGATIVE
Protein, UA: NEGATIVE
Spec Grav, UA: 1.025
Urobilinogen, UA: 0.2
pH, UA: 5.5

## 2011-06-06 LAB — BASIC METABOLIC PANEL
BUN: 18 mg/dL (ref 6–23)
CO2: 26 mEq/L (ref 19–32)
Calcium: 8.7 mg/dL (ref 8.4–10.5)
Chloride: 105 mEq/L (ref 96–112)
Creatinine, Ser: 0.8 mg/dL (ref 0.4–1.5)
GFR: 105.34 mL/min (ref >60.00)
Glucose, Bld: 103 mg/dL — ABNORMAL HIGH (ref 70–99)
Potassium: 4.6 mEq/L (ref 3.5–5.1)
Sodium: 141 mEq/L (ref 135–145)

## 2011-06-06 LAB — TSH: TSH: 1.81 u[IU]/mL (ref 0.35–5.50)

## 2011-06-07 LAB — HEPATIC FUNCTION PANEL
ALT: 25 U/L (ref 0–53)
AST: 19 U/L (ref 0–37)
Albumin: 4.4 g/dL (ref 3.5–5.2)
Alkaline Phosphatase: 48 U/L (ref 39–117)
Bilirubin, Direct: 0 mg/dL (ref 0.0–0.3)
Total Bilirubin: 0.9 mg/dL (ref 0.3–1.2)
Total Protein: 7.5 g/dL (ref 6.0–8.3)

## 2011-06-12 NOTE — Progress Notes (Signed)
Left voice message with normal results. 

## 2011-06-13 ENCOUNTER — Ambulatory Visit (INDEPENDENT_AMBULATORY_CARE_PROVIDER_SITE_OTHER): Payer: BC Managed Care – PPO | Admitting: Family Medicine

## 2011-06-13 ENCOUNTER — Encounter: Payer: Self-pay | Admitting: Family Medicine

## 2011-06-13 VITALS — BP 126/84 | HR 75 | Temp 98.1°F | Ht 68.5 in | Wt 228.0 lb

## 2011-06-13 DIAGNOSIS — Z0000 Encounter for general adult medical examination without abnormal findings: Secondary | ICD-10-CM

## 2011-06-13 MED ORDER — AMLODIPINE BESYLATE 5 MG PO TABS: 5.0000 mg | ORAL_TABLET | Freq: Every day | ORAL | Status: DC

## 2011-06-13 NOTE — Progress Notes (Signed)
Subjective:    Patient ID: Micheal Dominguez, male    DOB: 06/07/1966, 45 y.o.   MRN: [id number]  HPI 45 yr old male for a cpx. He feels fine and has no concerns.    Review of Systems  Constitutional: Negative.   HENT: Negative.   Eyes: Negative.   Respiratory: Negative.   Cardiovascular: Negative.   Gastrointestinal: Negative.   Genitourinary: Negative.   Musculoskeletal: Negative.   Skin: Negative.   Neurological: Negative.   Hematological: Negative.   Psychiatric/Behavioral: Negative.        Objective:   Physical Exam  Constitutional: He is oriented to person, place, and time. He appears well-developed and well-nourished. No distress.  HENT:  Head: Normocephalic and atraumatic.  Right Ear: External ear normal.  Left Ear: External ear normal.  Nose: Nose normal.  Mouth/Throat: Oropharynx is clear and moist. No oropharyngeal exudate.  Eyes: Conjunctivae and EOM are normal. Pupils are equal, round, and reactive to light. Right eye exhibits no discharge. Left eye exhibits no discharge. No scleral icterus.  Neck: Neck supple. No JVD present. No tracheal deviation present. No thyromegaly present.  Cardiovascular: Normal rate, regular rhythm, normal heart sounds and intact distal pulses.  Exam reveals no gallop and no friction rub.   No murmur heard. Pulmonary/Chest: Effort normal and breath sounds normal. No respiratory distress. He has no wheezes. He has no rales. He exhibits no tenderness.  Abdominal: Soft. Bowel sounds are normal. He exhibits no distension and no mass. There is no tenderness. There is no rebound and no guarding.  Genitourinary: Rectum normal, prostate normal and penis normal. Guaiac negative stool. No penile tenderness.  Musculoskeletal: Normal range of motion. He exhibits no edema and no tenderness.  Lymphadenopathy:    He has no cervical adenopathy.  Neurological: He is alert and oriented to person, place, and time. He has normal reflexes. No cranial nerve  deficit. He exhibits normal muscle tone. Coordination normal.  Skin: Skin is warm and dry. No rash noted. He is not diaphoretic. No erythema. No pallor.  Psychiatric: He has a normal mood and affect. His behavior is normal. Judgment and thought content normal.          Assessment & Plan:  Get exercise and lose some weight

## 2011-07-19 ENCOUNTER — Other Ambulatory Visit: Payer: Self-pay | Admitting: Family Medicine

## 2011-10-17 ENCOUNTER — Other Ambulatory Visit: Payer: Self-pay | Admitting: Family Medicine

## 2012-01-12 ENCOUNTER — Other Ambulatory Visit: Payer: Self-pay | Admitting: Family Medicine

## 2012-02-04 ENCOUNTER — Telehealth: Payer: Self-pay | Admitting: Family Medicine

## 2012-02-04 NOTE — Telephone Encounter (Signed)
Pt had switched pharmacy's and is requesting a script for amLODipine (NORVASC) 5 MG tablet sent into [facility].  Pt is almost out of medication and is requesting a 30 day refill be called into [facility]

## 2012-02-05 MED ORDER — AMLODIPINE BESYLATE 5 MG PO TABS: 5.0000 mg | ORAL_TABLET | Freq: Every day | ORAL | Status: DC

## 2012-02-05 NOTE — Telephone Encounter (Signed)
I sent a 90 day supply to [facility] & a 30 day supply to [facility] and spoke with pt.

## 2012-03-10 ENCOUNTER — Encounter (HOSPITAL_COMMUNITY): Payer: Self-pay | Admitting: *Deleted

## 2012-03-10 ENCOUNTER — Emergency Department (HOSPITAL_COMMUNITY): Payer: BC Managed Care – PPO

## 2012-03-10 ENCOUNTER — Emergency Department (HOSPITAL_COMMUNITY)
Admission: EM | Admit: 2012-03-10 | Discharge: 2012-03-11 | Disposition: A | Discharge status: Home / Self Care | Payer: BC Managed Care – PPO | Attending: Emergency Medicine | Admitting: Emergency Medicine

## 2012-03-10 DIAGNOSIS — M545 Low back pain, unspecified: Secondary | ICD-10-CM | POA: Insufficient documentation

## 2012-03-10 DIAGNOSIS — I10 Essential (primary) hypertension: Secondary | ICD-10-CM | POA: Insufficient documentation

## 2012-03-10 DIAGNOSIS — M5430 Sciatica, unspecified side: Secondary | ICD-10-CM

## 2012-03-10 MED ORDER — HYDROMORPHONE HCL PF 1 MG/ML IJ SOLN
1.0000 mg | Freq: Once | INTRAMUSCULAR | Status: AC
  Administered 2012-03-10: 1 mg via INTRAMUSCULAR
  Filled 2012-03-10: qty 1

## 2012-03-10 MED ORDER — DEXAMETHASONE SODIUM PHOSPHATE 4 MG/ML IJ SOLN
8.0000 mg | Freq: Once | INTRAMUSCULAR | Status: AC
  Administered 2012-03-10: 8 mg via INTRAMUSCULAR
  Filled 2012-03-10: qty 2

## 2012-03-10 MED ORDER — ONDANSETRON HCL 4 MG PO TABS
4.0000 mg | ORAL_TABLET | Freq: Once | ORAL | Status: AC
  Administered 2012-03-10: 4 mg via ORAL
  Filled 2012-03-10: qty 1

## 2012-03-10 MED ORDER — DIAZEPAM 5 MG PO TABS
5.0000 mg | ORAL_TABLET | Freq: Once | ORAL | Status: AC
  Administered 2012-03-10: 5 mg via ORAL
  Filled 2012-03-10: qty 1

## 2012-03-10 NOTE — ED Provider Notes (Signed)
History     CSN: [id number]  Arrival date & time 03/10/12  2223   First MD Initiated Contact with Patient 03/10/12 2302      Chief Complaint  Patient presents with  . Fall    (Consider location/radiation/quality/duration/timing/severity/associated sxs/prior treatment) HPI Comments: Patient states he's been having some problems with his lower back. For some time but has been able to take care of it with Motrin and with movement and exercise. In the last few, days. He's had increasing pain. This morning (May 6) his right leg" gave away." The patient sustained a fall, and he's been having pain and spasm of the right lower back extending to the right hip and into the right leg. Since that time. The patient was seen by his primary care physician office. The patient was treated with ibuprofen and Flexeril. The patient states that he is getting no relief from this problem. He denies any loss of bowel or bladder function. He states that he cannot find a comfortable, place to lay for more than a minute. He has not had any previous procedures or operations on his back.  The history is provided by the patient and the spouse.    Past Medical History  Diagnosis Date  . Hypertension     Past Surgical History  Procedure Date  . Knee arthroscopy     right    Family History  Problem Relation Age of Onset  . Cancer Father     prostate  . Cancer Brother     prostate  . Heart disease Other   . Diabetes Other   . Hyperlipidemia Other   . Hypertension Other   . Cancer Other     lung,prostate  . Stroke Other     History  Substance Use Topics  . Smoking status: Never Smoker   . Smokeless tobacco: Current User    Types: Chew  . Alcohol Use: 1.8 oz/week    3 Cans of beer per week      Review of Systems  Constitutional: Negative for activity change.       All ROS Neg except as noted in HPI  HENT: Negative for nosebleeds and neck pain.   Eyes: Negative for photophobia and discharge.    Respiratory: Negative for cough, shortness of breath and wheezing.   Cardiovascular: Negative for chest pain and palpitations.  Gastrointestinal: Negative for abdominal pain and blood in stool.  Genitourinary: Negative for dysuria, frequency and hematuria.  Musculoskeletal: Positive for back pain. Negative for arthralgias.  Skin: Negative.   Neurological: Negative for dizziness, seizures and speech difficulty.  Psychiatric/Behavioral: Negative for hallucinations and confusion.    Allergies  Penicillins  Home Medications   Current Outpatient Rx  Name Route Sig Dispense Refill  . AMLODIPINE BESYLATE 5 MG PO TABS Oral Take 1 tablet (5 mg total) by mouth daily. 90 tablet 0  . CYCLOBENZAPRINE HCL 5 MG PO TABS Oral Take 5 mg by mouth 3 (three) times daily as needed.    . IBUPROFEN 200 MG PO TABS Oral Take 200 mg by mouth every 6 (six) hours as needed. For pain    . IBUPROFEN 800 MG PO TABS Oral Take 800 mg by mouth every 6 (six) hours as needed. For pain      BP 159/96  Pulse 82  Temp(Src) 97.7 F (36.5 C) (Oral)  Ht 5\' 7"  (1.702 m)  Wt 235 lb (106.595 kg)  BMI 36.81 kg/m2  SpO2 97%  Physical Exam  Nursing  note and vitals reviewed. Constitutional: He is oriented to person, place, and time. He appears well-developed and well-nourished.  Non-toxic appearance.  HENT:  Head: Normocephalic.  Right Ear: Tympanic membrane and external ear normal.  Left Ear: Tympanic membrane and external ear normal.  Eyes: EOM and lids are normal. Pupils are equal, round, and reactive to light.  Neck: Normal range of motion. Neck supple. Carotid bruit is not present.  Cardiovascular: Normal rate, regular rhythm, normal heart sounds, intact distal pulses and normal pulses.   Pulmonary/Chest: Breath sounds normal. No respiratory distress.  Abdominal: Soft. Bowel sounds are normal. There is no tenderness. There is no guarding.  Musculoskeletal: Normal range of motion.       Right lower lumbar area pain  with standing. Pain from back to the right hip and extending into the right thigh with right straight leg raise. Distal pulses are symmetrical. Sensory is symmetrical.  Lymphadenopathy:       Head (right side): No submandibular adenopathy present.       Head (left side): No submandibular adenopathy present.    He has no cervical adenopathy.  Neurological: He is alert and oriented to person, place, and time. He has normal strength. No cranial nerve deficit or sensory deficit. He exhibits normal muscle tone. Gait abnormal. Coordination normal.  Skin: Skin is warm and dry.  Psychiatric: He has a normal mood and affect. His speech is normal.    ED Course  Procedures (including critical care time)  Labs Reviewed - No data to display No results found.   No diagnosis found.    MDM  I have reviewed nursing notes, vital signs, and all appropriate lab and imaging results for this patient. The x-rays of the lumbar spine are consistent with degenerative disc disease, and loss of height of L5, S1. These findings discussed with the patient and patient's wife. Patient strongly advised to see Dr. Luiz Blare for additional evaluation and possible MRI. Prescription for Decadron, Percocet, given to the patient. Patient instructed to continue his Flexeril. Patient also advised to use a heating pad to the back.       Kathie Dike, [place] 03/11/12 [phone]

## 2012-03-10 NOTE — ED Notes (Signed)
This am rt leg "gave way" and pt fell.  Pain /spasms rt hip and leg. Seen a MD office and given meds. But cont to hurt.

## 2012-03-11 MED ORDER — OXYCODONE-ACETAMINOPHEN 5-325 MG PO TABS: 1.0000 | ORAL_TABLET | Freq: Four times a day (QID) | ORAL | Status: AC | PRN

## 2012-03-11 MED ORDER — DEXAMETHASONE 6 MG PO TABS: ORAL_TABLET | ORAL | Status: AC

## 2012-03-11 MED ORDER — OXYCODONE-ACETAMINOPHEN 5-325 MG PO TABS
1.0000 | ORAL_TABLET | Freq: Once | ORAL | Status: AC
  Administered 2012-03-11: 1 via ORAL
  Filled 2012-03-11: qty 1

## 2012-03-11 NOTE — Discharge Instructions (Signed)
Sciatica PLEASE [place] A HEATING PAD TO  THE LOWER BACK. CONTINUE YOUR FLEXERIL. ADD DECADRON AND PERCOCET. PERCOCET MAY CAUSE DROWSINESS AND CONSTIPATION. USE WITH CAUTION.    PLEASE CALL DR GRAVES FOR OFFICE APPOINTMENT OR SEE YOUR MD FOR POSSIBLE MRI  CONCERNING YOUR BACK                                                                      Sciatica is a weakness and/or changes in sensation (tingling, jolts, hot and cold, numbness) along the path the sciatic nerve travels. Irritation or damage to lumbar nerve roots is often also referred to as lumbar radiculopathy.  Lumbar radiculopathy (Sciatica) is the most common form of this problem. Radiculopathy can occur in any of the nerves coming out of the spinal cord. The problems caused depend on which nerves are involved. The sciatic nerve is the large nerve supplying the branches of nerves going from the hip to the toes. It often causes a numbness or weakness in the skin and/or muscles that the sciatic nerve serves. It also may cause symptoms (problems) of pain, burning, tingling, or electric shock-like feelings in the path of this nerve. This usually comes from injury to the fibers that make up the sciatic nerve. Some of these symptoms are low back pain and/or unpleasant feelings in the following areas:  From the mid-buttock down the back of the leg to the back of the knee.   And/or the outside of the calf and top of the foot.   And/or behind the inner ankle to the sole of the foot.  CAUSES   Herniated or slipped disc. Discs are the little cushions between the bones in the back.   Pressure by the piriformis muscle in the buttock on the sciatic nerve (Piriformis Syndrome).   Misalignment of the bones in the lower back and buttocks (Sacroiliac Joint Derangement).   Narrowing of the spinal canal that puts pressure on or pinches the fibers that make up the sciatic nerve.   A slipped vertebra that is out of line with those above or beneath it.    Abnormality of the nervous system itself so that nerve fibers do not transmit signals properly, especially to feet and calves (neuropathy).   Tumor (this is rare).  Your caregiver can usually determine the cause of your sciatica and begin the treatment most likely to help you. TREATMENT  Taking over-the-counter painkillers, physical therapy, rest, exercise, spinal manipulation, and injections of anesthetics and/or steroids may be used. Surgery, acupuncture, and Yoga can also be effective. Mind over matter techniques, mental imagery, and changing factors such as your bed, chair, desk height, posture, and activities are other treatments that may be helpful. You and your caregiver can help determine what is best for you. With proper diagnosis, the cause of most sciatica can be identified and removed. Communication and cooperation between your caregiver and you is essential. If you are not successful immediately, do not be discouraged. With time, a proper treatment can be found that will make you comfortable. HOME CARE INSTRUCTIONS   If the pain is coming from a problem in the back, applying ice to that area for 15 to 20 minutes, 3 to 4 times per day while awake, may be  helpful. Put the ice in a plastic bag. Place a towel between the bag of ice and your skin.   You may exercise or perform your usual activities if these do not aggravate your pain, or as suggested by your caregiver.   Only take over-the-counter or prescription medicines for pain, discomfort, or fever as directed by your caregiver.   If your caregiver has given you a follow-up appointment, it is very important to keep that appointment. Not keeping the appointment could result in a chronic or permanent injury, pain, and disability. If there is any problem keeping the appointment, you must call back to this facility for assistance.  SEEK IMMEDIATE MEDICAL CARE IF:   You experience loss of control of bowel or bladder.   You have  increasing weakness in the trunk, buttocks, or legs.   There is numbness in any areas from the hip down to the toes.   You have difficulty walking or keeping your balance.   You have any of the above, with fever or forceful vomiting.  Document Released: 10/16/2001 Document Revised: 10/11/2011 Document Reviewed: 06/04/2008 [facility] Patient Information 2012 [place], [place].

## 2012-03-11 NOTE — ED Provider Notes (Signed)
Medical screening examination/treatment/procedure(s) were performed by non-physician practitioner and as supervising physician I was immediately available for consultation/collaboration.  Sunnie Nielsen, MD 03/11/12 [phone]

## 2012-03-11 NOTE — ED Notes (Signed)
Patient states muscle spasms have decreased.

## 2012-06-05 ENCOUNTER — Telehealth: Payer: Self-pay | Admitting: Family Medicine

## 2012-06-05 NOTE — Telephone Encounter (Signed)
Patient called stating that he would like to have his amlodipine called into [facility] in [place]. Patient is scheduled for his cpx 07/11/12. Please assist.

## 2012-06-06 MED ORDER — AMLODIPINE BESYLATE 5 MG PO TABS: 5.0000 mg | ORAL_TABLET | Freq: Every day | ORAL | Status: DC

## 2012-06-06 NOTE — Telephone Encounter (Signed)
I sent script [facility]. 

## 2012-06-23 ENCOUNTER — Telehealth: Payer: Self-pay | Admitting: Family Medicine

## 2012-06-23 NOTE — Telephone Encounter (Signed)
Pt called and checked with the [facility] in [place] re: refill of Amlodipine 5mg , but was told by pharmacist that nothing has been called in. Pls call in asap today.

## 2012-06-23 NOTE — Telephone Encounter (Signed)
I phoned in script again and left message for pt.

## 2012-07-04 ENCOUNTER — Other Ambulatory Visit: Payer: BC Managed Care – PPO

## 2012-07-11 ENCOUNTER — Encounter: Payer: BC Managed Care – PPO | Admitting: Family Medicine

## 2012-07-18 ENCOUNTER — Other Ambulatory Visit (INDEPENDENT_AMBULATORY_CARE_PROVIDER_SITE_OTHER): Payer: BC Managed Care – PPO

## 2012-07-18 ENCOUNTER — Telehealth: Payer: Self-pay | Admitting: Family Medicine

## 2012-07-18 DIAGNOSIS — Z0000 Encounter for general adult medical examination without abnormal findings: Secondary | ICD-10-CM

## 2012-07-18 LAB — CBC WITH DIFFERENTIAL/PLATELET
Basophils Absolute: 0 10*3/uL (ref 0.0–0.1)
Basophils Relative: 0.5 % (ref 0.0–3.0)
Eosinophils Absolute: 0.3 10*3/uL (ref 0.0–0.7)
Eosinophils Relative: 3.8 % (ref 0.0–5.0)
HCT: 43.6 % (ref 39.0–52.0)
Hemoglobin: 14.6 g/dL (ref 13.0–17.0)
Lymphocytes Relative: 25.5 % (ref 12.0–46.0)
Lymphs Abs: 2.3 10*3/uL (ref 0.7–4.0)
MCHC: 33.6 g/dL (ref 30.0–36.0)
MCV: 87.6 fl (ref 78.0–100.0)
Monocytes Absolute: 0.8 10*3/uL (ref 0.1–1.0)
Monocytes Relative: 9.1 % (ref 3.0–12.0)
Neutro Abs: 5.6 10*3/uL (ref 1.4–7.7)
Neutrophils Relative %: 61.1 % (ref 43.0–77.0)
Platelets: 300 10*3/uL (ref 150.0–400.0)
RBC: 4.98 Mil/uL (ref 4.22–5.81)
RDW: 12.5 % (ref 11.5–14.6)
WBC: 9.2 10*3/uL (ref 4.5–10.5)

## 2012-07-18 LAB — BASIC METABOLIC PANEL
BUN: 17 mg/dL (ref 6–23)
CO2: 23 mEq/L (ref 19–32)
Calcium: 8.8 mg/dL (ref 8.4–10.5)
Chloride: 106 mEq/L (ref 96–112)
Creatinine, Ser: 0.8 mg/dL (ref 0.4–1.5)
GFR: 103.41 mL/min (ref >60.00)
Glucose, Bld: 104 mg/dL — ABNORMAL HIGH (ref 70–99)
Potassium: 3.9 mEq/L (ref 3.5–5.1)
Sodium: 136 mEq/L (ref 135–145)

## 2012-07-18 LAB — LIPID PANEL
Cholesterol: 200 mg/dL (ref 0–200)
HDL: 45 mg/dL (ref >39.00)
LDL Cholesterol: 138 mg/dL — ABNORMAL HIGH (ref 0–99)
Total CHOL/HDL Ratio: 4
Triglycerides: 86 mg/dL (ref 0.0–149.0)
VLDL: 17.2 mg/dL (ref 0.0–40.0)

## 2012-07-18 LAB — POCT URINALYSIS DIPSTICK
Bilirubin, UA: NEGATIVE
Blood, UA: NEGATIVE
Glucose, UA: NEGATIVE
Ketones, UA: NEGATIVE
Leukocytes, UA: NEGATIVE
Nitrite, UA: NEGATIVE
Protein, UA: NEGATIVE
Spec Grav, UA: 1.02
Urobilinogen, UA: 0.2
pH, UA: 5.5

## 2012-07-18 LAB — HEPATIC FUNCTION PANEL
ALT: 32 U/L (ref 0–53)
AST: 26 U/L (ref 0–37)
Albumin: 4.1 g/dL (ref 3.5–5.2)
Alkaline Phosphatase: 51 U/L (ref 39–117)
Bilirubin, Direct: 0 mg/dL (ref 0.0–0.3)
Total Bilirubin: 0.9 mg/dL (ref 0.3–1.2)
Total Protein: 7.6 g/dL (ref 6.0–8.3)

## 2012-07-18 LAB — TSH: TSH: 1.96 u[IU]/mL (ref 0.35–5.50)

## 2012-07-18 MED ORDER — AMLODIPINE BESYLATE 5 MG PO TABS: 5.0000 mg | ORAL_TABLET | Freq: Every day | ORAL | Status: DC

## 2012-07-18 NOTE — Telephone Encounter (Signed)
Patient is requesting a refill on Norvasc.  Pleas send to [facility] in [place].

## 2012-07-18 NOTE — Telephone Encounter (Signed)
Rx sent to pharmacy   

## 2012-07-22 NOTE — Progress Notes (Signed)
Quick Note:  I left voice message with results. ______ 

## 2012-08-06 ENCOUNTER — Encounter: Payer: Self-pay | Admitting: Family Medicine

## 2012-08-06 ENCOUNTER — Ambulatory Visit (INDEPENDENT_AMBULATORY_CARE_PROVIDER_SITE_OTHER): Payer: BC Managed Care – PPO | Admitting: Family Medicine

## 2012-08-06 VITALS — BP 126/78 | HR 89 | Temp 98.5°F | Ht 68.25 in | Wt 228.0 lb

## 2012-08-06 DIAGNOSIS — Z0000 Encounter for general adult medical examination without abnormal findings: Secondary | ICD-10-CM

## 2012-08-06 MED ORDER — AMLODIPINE BESYLATE 5 MG PO TABS: 5.0000 mg | ORAL_TABLET | Freq: Every day | ORAL | Status: DC

## 2012-08-06 NOTE — Progress Notes (Signed)
Subjective:    Patient ID: Micheal Dominguez, male    DOB: 17-Jul-1966, 46 y.o.   MRN: [id number]  HPI 46 yr old male for a cpx. He feels fine and has no concerns.    Review of Systems  Constitutional: Negative.   HENT: Negative.   Eyes: Negative.   Respiratory: Negative.   Cardiovascular: Negative.   Gastrointestinal: Negative.   Genitourinary: Negative.   Musculoskeletal: Negative.   Skin: Negative.   Neurological: Negative.   Hematological: Negative.   Psychiatric/Behavioral: Negative.        Objective:   Physical Exam  Constitutional: He is oriented to person, place, and time. He appears well-developed and well-nourished. No distress.  HENT:  Head: Normocephalic and atraumatic.  Right Ear: External ear normal.  Left Ear: External ear normal.  Nose: Nose normal.  Mouth/Throat: Oropharynx is clear and moist. No oropharyngeal exudate.  Eyes: Conjunctivae normal and EOM are normal. Pupils are equal, round, and reactive to light. Right eye exhibits no discharge. Left eye exhibits no discharge. No scleral icterus.  Neck: Neck supple. No JVD present. No tracheal deviation present. No thyromegaly present.  Cardiovascular: Normal rate, regular rhythm, normal heart sounds and intact distal pulses.  Exam reveals no gallop and no friction rub.   No murmur heard. Pulmonary/Chest: Effort normal and breath sounds normal. No respiratory distress. He has no wheezes. He has no rales. He exhibits no tenderness.  Abdominal: Soft. Bowel sounds are normal. He exhibits no distension and no mass. There is no tenderness. There is no rebound and no guarding.  Genitourinary: Rectum normal, prostate normal and penis normal. Guaiac negative stool. No penile tenderness.  Musculoskeletal: Normal range of motion. He exhibits no edema and no tenderness.  Lymphadenopathy:    He has no cervical adenopathy.  Neurological: He is alert and oriented to person, place, and time. He has normal reflexes. No cranial  nerve deficit. He exhibits normal muscle tone. Coordination normal.  Skin: Skin is warm and dry. No rash noted. He is not diaphoretic. No erythema. No pallor.  Psychiatric: He has a normal mood and affect. His behavior is normal. Judgment and thought content normal.          Assessment & Plan:  Well exam. We discussed his need to lose some weight and to reduce the fats and carbs in his diet.

## 2013-06-16 ENCOUNTER — Ambulatory Visit (INDEPENDENT_AMBULATORY_CARE_PROVIDER_SITE_OTHER): Payer: BC Managed Care – PPO | Admitting: Family Medicine

## 2013-06-16 ENCOUNTER — Encounter: Payer: Self-pay | Admitting: Family Medicine

## 2013-06-16 VITALS — BP 130/84 | HR 89 | Temp 98.4°F | Wt 239.0 lb

## 2013-06-16 DIAGNOSIS — J329 Chronic sinusitis, unspecified: Secondary | ICD-10-CM

## 2013-06-16 DIAGNOSIS — J309 Allergic rhinitis, unspecified: Secondary | ICD-10-CM

## 2013-06-16 MED ORDER — FLUTICASONE PROPIONATE 50 MCG/ACT NA SUSP: 2.0000 | Freq: Every day | NASAL | Status: DC

## 2013-06-16 MED ORDER — AZITHROMYCIN 250 MG PO TABS: ORAL_TABLET | ORAL | Status: DC

## 2013-06-16 NOTE — Progress Notes (Signed)
Chief Complaint  Patient presents with  . Cough    right inner ear pain,     HPI:  Micheal Dominguez, a 47 yo M pt of Dr. Clent Ridges, is here for an acute visit for ear pain: -started a few weeks ago -symptoms: drainage, cough, R ear feels full, R sinus pain and pressure, lots of sneezing -denies: fevers, sore throat -has tried: nothing -sick contacts: none known  ROS: See pertinent positives and negatives per HPI.  Past Medical History  Diagnosis Date  . Hypertension     Family History  Problem Relation Age of Onset  . Cancer Father     prostate  . Cancer Brother     prostate  . Heart disease Other   . Diabetes Other   . Hyperlipidemia Other   . Hypertension Other   . Cancer Other     lung,prostate  . Stroke Other     History   Social History  . Marital Status: Married    Spouse Name: N/A    Number of Children: N/A  . Years of Education: N/A   Social History Main Topics  . Smoking status: Never Smoker   . Smokeless tobacco: Current User    Types: Chew  . Alcohol Use: 1.8 oz/week    3 Cans of beer per week  . Drug Use: No  . Sexually Active: None   Other Topics Concern  . None   Social History Narrative  . None    Current outpatient prescriptions:amLODipine (NORVASC) 5 MG tablet, Take 1 tablet (5 mg total) by mouth daily., Disp: 90 tablet, Rfl: 3;  azithromycin (ZITHROMAX) 250 MG tablet, 2 tabs on first day, then one tab daily PO for 4 days, Disp: 6 tablet, Rfl: 0;  fluticasone (FLONASE) 50 MCG/ACT nasal spray, Place 2 sprays into the nose daily., Disp: 16 g, Rfl: 1  EXAM:  Filed Vitals:   06/16/13 0914  BP: 130/84  Pulse: 89  Temp: 98.4 F (36.9 C)    Body mass index is 36.06 kg/(m^2).  GENERAL: vitals reviewed and listed above, alert, oriented, appears well hydrated and in no acute distress  HEENT: atraumatic, conjunttiva clear, no obvious abnormalities on inspection of external nose and ears, normal appearance of ear canals and TMs, white nasal  congestion R>L, pale boggy turbinatesmild post oropharyngeal erythema with PND and cobblestoning, no tonsillar edema or exudate, max sinus TTP  NECK: no obvious masses on inspection  LUNGS: clear to auscultation bilaterally, no wheezes, rales or rhonchi, good air movement  CV: HRRR, no peripheral edema  MS: moves all extremities without noticeable abnormality  PSYCH: pleasant and cooperative, no obvious depression or anxiety  ASSESSMENT AND PLAN:  Discussed the following assessment and plan:  AR (allergic rhinitis) - Plan: fluticasone (FLONASE) 50 MCG/ACT nasal spray  Sinusitis - Plan: azithromycin (ZITHROMAX) 250 MG tablet  -I think he has some underlying AR and possibly has developed a bacterial sinusitis Recommendations per orders and instructions, risks and use of medications and return precautions discussed. -Patient advised to return or notify a doctor immediately if symptoms worsen or persist or new concerns arise.  Patient Instructions  -start flonase 2 sprays each nostril daily for 1 month, then can back down to 1 spray each nostril  -Claritin, allegra or zyrtec daily  --As we discussed, we have prescribed a new medication (azithromycin) for you at this appointment. We discussed the common and serious potential adverse effects of this medication and you can review these and  more with the pharmacist when you pick up your medication.  Please follow the instructions for use carefully and notify us immediately if you have any problems taking this medication.  -follow up in 1 month if needed         Chao Blazejewski, [facility] R.

## 2013-06-16 NOTE — Patient Instructions (Signed)
-  start flonase 2 sprays each nostril daily for 1 month, then can back down to 1 spray each nostril  -Claritin, allegra or zyrtec daily  --As we discussed, we have prescribed a new medication (azithromycin) for you at this appointment. We discussed the common and serious potential adverse effects of this medication and you can review these and more with the pharmacist when you pick up your medication.  Please follow the instructions for use carefully and notify us immediately if you have any problems taking this medication.  -follow up in 1 month if needed

## 2013-09-14 ENCOUNTER — Other Ambulatory Visit: Payer: Self-pay | Admitting: Family Medicine

## 2013-09-14 NOTE — Telephone Encounter (Signed)
Pt has been without bp med since 09-09-13

## 2013-12-15 ENCOUNTER — Other Ambulatory Visit: Payer: Self-pay | Admitting: Family Medicine

## 2014-01-07 ENCOUNTER — Telehealth: Payer: Self-pay | Admitting: Family Medicine

## 2014-01-07 NOTE — Telephone Encounter (Signed)
Pt would like to add psa test to his cpe labs on 3/20. Pt states cancer runs in his family and he turned 54 this year.

## 2014-01-08 NOTE — Telephone Encounter (Signed)
This is automatic since he is over 50

## 2014-01-08 NOTE — Telephone Encounter (Signed)
I left a voice message with the below information.

## 2014-01-22 ENCOUNTER — Other Ambulatory Visit (INDEPENDENT_AMBULATORY_CARE_PROVIDER_SITE_OTHER): Payer: BC Managed Care – PPO

## 2014-01-22 DIAGNOSIS — Z0000 Encounter for general adult medical examination without abnormal findings: Secondary | ICD-10-CM

## 2014-01-22 LAB — BASIC METABOLIC PANEL
BUN: 13 mg/dL (ref 6–23)
CO2: 28 mEq/L (ref 19–32)
Calcium: 9.7 mg/dL (ref 8.4–10.5)
Chloride: 105 mEq/L (ref 96–112)
Creatinine, Ser: 1 mg/dL (ref 0.4–1.5)
GFR: 87.04 mL/min (ref >60.00)
Glucose, Bld: 119 mg/dL — ABNORMAL HIGH (ref 70–99)
Potassium: 5.3 mEq/L — ABNORMAL HIGH (ref 3.5–5.1)
Sodium: 140 mEq/L (ref 135–145)

## 2014-01-22 LAB — HEPATIC FUNCTION PANEL
ALT: 40 U/L (ref 0–53)
AST: 29 U/L (ref 0–37)
Albumin: 4.4 g/dL (ref 3.5–5.2)
Alkaline Phosphatase: 62 U/L (ref 39–117)
Bilirubin, Direct: 0.1 mg/dL (ref 0.0–0.3)
Total Bilirubin: 0.5 mg/dL (ref 0.3–1.2)
Total Protein: 7.4 g/dL (ref 6.0–8.3)

## 2014-01-22 LAB — CBC WITH DIFFERENTIAL/PLATELET
Basophils Absolute: 0 10*3/uL (ref 0.0–0.1)
Basophils Relative: 0.6 % (ref 0.0–3.0)
Eosinophils Absolute: 0.4 10*3/uL (ref 0.0–0.7)
Eosinophils Relative: 4.8 % (ref 0.0–5.0)
HCT: 42.7 % (ref 39.0–52.0)
Hemoglobin: 14.4 g/dL (ref 13.0–17.0)
Lymphocytes Relative: 31.3 % (ref 12.0–46.0)
Lymphs Abs: 2.6 10*3/uL (ref 0.7–4.0)
MCHC: 33.8 g/dL (ref 30.0–36.0)
MCV: 86.5 fl (ref 78.0–100.0)
Monocytes Absolute: 0.7 10*3/uL (ref 0.1–1.0)
Monocytes Relative: 8.5 % (ref 3.0–12.0)
Neutro Abs: 4.6 10*3/uL (ref 1.4–7.7)
Neutrophils Relative %: 54.8 % (ref 43.0–77.0)
Platelets: 330 10*3/uL (ref 150.0–400.0)
RBC: 4.93 Mil/uL (ref 4.22–5.81)
RDW: 13.1 % (ref 11.5–14.6)
WBC: 8.4 10*3/uL (ref 4.5–10.5)

## 2014-01-22 LAB — POCT URINALYSIS DIPSTICK
Bilirubin, UA: NEGATIVE
Glucose, UA: NEGATIVE
Ketones, UA: NEGATIVE
Leukocytes, UA: NEGATIVE
Nitrite, UA: NEGATIVE
Protein, UA: NEGATIVE
Spec Grav, UA: 1.02
Urobilinogen, UA: 0.2
pH, UA: 6

## 2014-01-22 LAB — TSH: TSH: 2.96 u[IU]/mL (ref 0.35–5.50)

## 2014-01-22 LAB — LIPID PANEL
Cholesterol: 206 mg/dL — ABNORMAL HIGH (ref 0–200)
HDL: 52.6 mg/dL (ref >39.00)
LDL Cholesterol: 140 mg/dL — ABNORMAL HIGH (ref 0–99)
Total CHOL/HDL Ratio: 4
Triglycerides: 65 mg/dL (ref 0.0–149.0)
VLDL: 13 mg/dL (ref 0.0–40.0)

## 2014-01-22 LAB — PSA: PSA: 1.93 ng/mL (ref 0.10–4.00)

## 2014-01-28 ENCOUNTER — Encounter: Payer: BC Managed Care – PPO | Admitting: Family Medicine

## 2014-02-01 ENCOUNTER — Ambulatory Visit (INDEPENDENT_AMBULATORY_CARE_PROVIDER_SITE_OTHER): Payer: BC Managed Care – PPO | Admitting: Family Medicine

## 2014-02-01 ENCOUNTER — Encounter: Payer: Self-pay | Admitting: Family Medicine

## 2014-02-01 VITALS — BP 140/96 | HR 87 | Temp 99.5°F | Ht 67.75 in | Wt 232.0 lb

## 2014-02-01 DIAGNOSIS — J309 Allergic rhinitis, unspecified: Secondary | ICD-10-CM

## 2014-02-01 DIAGNOSIS — I4510 Unspecified right bundle-branch block: Secondary | ICD-10-CM

## 2014-02-01 DIAGNOSIS — Z0000 Encounter for general adult medical examination without abnormal findings: Secondary | ICD-10-CM

## 2014-02-01 MED ORDER — FLUTICASONE PROPIONATE 50 MCG/ACT NA SUSP: 2.0000 | Freq: Every day | NASAL | Status: DC

## 2014-02-01 MED ORDER — AMLODIPINE BESYLATE 10 MG PO TABS: 10.0000 mg | ORAL_TABLET | Freq: Every day | ORAL | Status: DC

## 2014-02-01 NOTE — Progress Notes (Signed)
Subjective:    Patient ID: Micheal Dominguez, male    DOB: 1966-02-19, 48 y.o.   MRN: [id number]  HPI 48 yr old male for a cpx. He feels well. He has put on 4 lbs of weight from last year.    Review of Systems  Constitutional: Negative.   HENT: Negative.   Eyes: Negative.   Respiratory: Negative.   Cardiovascular: Negative.   Gastrointestinal: Negative.   Genitourinary: Negative.   Musculoskeletal: Negative.   Skin: Negative.   Neurological: Negative.   Psychiatric/Behavioral: Negative.        Objective:   Physical Exam  Constitutional: He is oriented to person, place, and time. He appears well-developed and well-nourished. No distress.  HENT:  Head: Normocephalic and atraumatic.  Right Ear: External ear normal.  Left Ear: External ear normal.  Nose: Nose normal.  Mouth/Throat: Oropharynx is clear and moist. No oropharyngeal exudate.  Eyes: Conjunctivae and EOM are normal. Pupils are equal, round, and reactive to light. Right eye exhibits no discharge. Left eye exhibits no discharge. No scleral icterus.  Neck: Neck supple. No JVD present. No tracheal deviation present. No thyromegaly present.  Cardiovascular: Normal rate, regular rhythm, normal heart sounds and intact distal pulses.  Exam reveals no gallop and no friction rub.   No murmur heard. EKG shows RBBB (no comparisons available)   Pulmonary/Chest: Effort normal and breath sounds normal. No respiratory distress. He has no wheezes. He has no rales. He exhibits no tenderness.  Abdominal: Soft. Bowel sounds are normal. He exhibits no distension and no mass. There is no tenderness. There is no rebound and no guarding.  Genitourinary: Rectum normal, prostate normal and penis normal. Guaiac negative stool. No penile tenderness.  Musculoskeletal: Normal range of motion. He exhibits no edema and no tenderness.  Lymphadenopathy:    He has no cervical adenopathy.  Neurological: He is alert and oriented to person, place, and  time. He has normal reflexes. No cranial nerve deficit. He exhibits normal muscle tone. Coordination normal.  Skin: Skin is warm and dry. No rash noted. He is not diaphoretic. No erythema. No pallor.  Psychiatric: He has a normal mood and affect. His behavior is normal. Judgment and thought content normal.          Assessment & Plan:  Well exam. He needs to lose 20-30 lbs and we spoke about his diet and exercise. His BP has gone up, his LDL is mildly elevated, and his glucose is high. We will increase the Amlodipine to 10 mg daily. Get an ECHO. Set up a colonoscopy.

## 2014-02-01 NOTE — Progress Notes (Signed)
Pre visit review using our clinic review tool, if applicable. No additional management support is needed unless otherwise documented below in the visit note. 

## 2014-02-02 ENCOUNTER — Telehealth: Payer: Self-pay | Admitting: Family Medicine

## 2014-02-02 NOTE — Telephone Encounter (Signed)
Relevant patient education mailed to patient.  

## 2014-02-23 ENCOUNTER — Ambulatory Visit (HOSPITAL_COMMUNITY): Payer: BC Managed Care – PPO | Attending: Family Medicine | Admitting: Cardiology

## 2014-02-23 DIAGNOSIS — J309 Allergic rhinitis, unspecified: Secondary | ICD-10-CM

## 2014-02-23 DIAGNOSIS — Z0000 Encounter for general adult medical examination without abnormal findings: Secondary | ICD-10-CM

## 2014-02-23 DIAGNOSIS — I4510 Unspecified right bundle-branch block: Secondary | ICD-10-CM

## 2014-02-23 DIAGNOSIS — I10 Essential (primary) hypertension: Secondary | ICD-10-CM | POA: Insufficient documentation

## 2014-02-23 DIAGNOSIS — Z8249 Family history of ischemic heart disease and other diseases of the circulatory system: Secondary | ICD-10-CM | POA: Insufficient documentation

## 2014-02-23 NOTE — Progress Notes (Signed)
Echo performed. 

## 2014-02-25 ENCOUNTER — Other Ambulatory Visit: Payer: Self-pay | Admitting: Family Medicine

## 2014-03-04 ENCOUNTER — Encounter: Payer: Self-pay | Admitting: Family Medicine

## 2014-09-17 ENCOUNTER — Telehealth: Payer: Self-pay | Admitting: Family Medicine

## 2014-09-17 DIAGNOSIS — Z0000 Encounter for general adult medical examination without abnormal findings: Secondary | ICD-10-CM

## 2014-09-17 NOTE — Telephone Encounter (Signed)
Pt said he was here in March for a cpe and was told he would be scheduled for colonoscopy. He is calling to say that he has never been contacted and would like to set up for a colonoscopy.

## 2014-09-17 NOTE — Telephone Encounter (Signed)
I put in another referral

## 2014-09-17 NOTE — Telephone Encounter (Signed)
I spoke with pt  

## 2014-10-25 ENCOUNTER — Encounter: Payer: Self-pay | Admitting: Family Medicine

## 2014-10-25 ENCOUNTER — Ambulatory Visit (INDEPENDENT_AMBULATORY_CARE_PROVIDER_SITE_OTHER): Payer: BC Managed Care – PPO | Admitting: Family Medicine

## 2014-10-25 VITALS — BP 136/90 | HR 67 | Temp 98.4°F | Wt 222.2 lb

## 2014-10-25 DIAGNOSIS — E291 Testicular hypofunction: Secondary | ICD-10-CM

## 2014-10-25 DIAGNOSIS — J0100 Acute maxillary sinusitis, unspecified: Secondary | ICD-10-CM

## 2014-10-25 MED ORDER — AZITHROMYCIN 250 MG PO TABS: ORAL_TABLET | ORAL | Status: DC

## 2014-10-25 MED ORDER — ALBUTEROL SULFATE HFA 108 (90 BASE) MCG/ACT IN AERS: 2.0000 | INHALATION_SPRAY | RESPIRATORY_TRACT | Status: DC | PRN

## 2014-10-25 MED ORDER — HYDROCODONE-HOMATROPINE 5-1.5 MG/5ML PO SYRP: 5.0000 mL | ORAL_SOLUTION | ORAL | Status: DC | PRN

## 2014-10-25 NOTE — Progress Notes (Signed)
Pre visit review using our clinic review tool, if applicable. No additional management support is needed unless otherwise documented below in the visit note. 

## 2014-10-25 NOTE — Progress Notes (Signed)
Subjective:    Patient ID: Micheal Dominguez, male    DOB: 1966-04-17, 48 y.o.   MRN: [id number]  HPI Here for one week of sinus pressure, PND, and a dry cough. On Mucinex and Nyquil.    Review of Systems  Constitutional: Negative.   HENT: Positive for congestion, postnasal drip and sinus pressure.   Eyes: Negative.   Respiratory: Positive for cough.        Objective:   Physical Exam  Constitutional: He appears well-developed and well-nourished.  HENT:  Right Ear: External ear normal.  Left Ear: External ear normal.  Nose: Nose normal.  Mouth/Throat: Oropharynx is clear and moist.  Eyes: Conjunctivae are normal.  Pulmonary/Chest: Effort normal and breath sounds normal. No respiratory distress. He has no wheezes. He has no rales.  Voice is hoarse   Lymphadenopathy:    He has no cervical adenopathy.          Assessment & Plan:  Drink fluids and add Mucinex

## 2015-03-08 ENCOUNTER — Other Ambulatory Visit: Payer: Self-pay | Admitting: Family Medicine

## 2015-03-08 ENCOUNTER — Telehealth: Payer: Self-pay | Admitting: Family Medicine

## 2015-03-08 NOTE — Telephone Encounter (Signed)
Pt request refill amLODipine (NORVASC) 10 MG tablet Pt made cpe appt for  5/26, however will be out of meds this week Can you refill 30 days? Also pt states dr wanted to do psa and testosterone at next cpx labs. Pt has lab appt on  Friday. OK to add these labs?

## 2015-03-08 NOTE — Telephone Encounter (Signed)
Call in #30 of Norvasc. Also add PSA and testosterone to the cpx labs please

## 2015-03-08 NOTE — Telephone Encounter (Signed)
Pt has CPE scheduled for May 2016, will send in refill.

## 2015-03-09 ENCOUNTER — Other Ambulatory Visit: Payer: Self-pay | Admitting: Family Medicine

## 2015-03-09 DIAGNOSIS — E291 Testicular hypofunction: Secondary | ICD-10-CM

## 2015-03-09 DIAGNOSIS — Z0000 Encounter for general adult medical examination without abnormal findings: Secondary | ICD-10-CM

## 2015-03-09 MED ORDER — AMLODIPINE BESYLATE 10 MG PO TABS: 10.0000 mg | ORAL_TABLET | Freq: Every day | ORAL | Status: DC

## 2015-03-09 NOTE — Telephone Encounter (Signed)
I sent script e-scribe and added labs in computer.

## 2015-03-11 ENCOUNTER — Other Ambulatory Visit (INDEPENDENT_AMBULATORY_CARE_PROVIDER_SITE_OTHER): Payer: BLUE CROSS/BLUE SHIELD

## 2015-03-11 DIAGNOSIS — E291 Testicular hypofunction: Secondary | ICD-10-CM | POA: Diagnosis not present

## 2015-03-11 DIAGNOSIS — Z0000 Encounter for general adult medical examination without abnormal findings: Secondary | ICD-10-CM | POA: Diagnosis not present

## 2015-03-11 LAB — BASIC METABOLIC PANEL
BUN: 15 mg/dL (ref 6–23)
CO2: 28 mEq/L (ref 19–32)
Calcium: 9.4 mg/dL (ref 8.4–10.5)
Chloride: 102 mEq/L (ref 96–112)
Creatinine, Ser: 0.89 mg/dL (ref 0.40–1.50)
GFR: 95.7 mL/min (ref >60.00)
Glucose, Bld: 105 mg/dL — ABNORMAL HIGH (ref 70–99)
Potassium: 4.4 mEq/L (ref 3.5–5.1)
Sodium: 136 mEq/L (ref 135–145)

## 2015-03-11 LAB — CBC WITH DIFFERENTIAL/PLATELET
Basophils Absolute: 0.1 10*3/uL (ref 0.0–0.1)
Basophils Relative: 0.6 % (ref 0.0–3.0)
Eosinophils Absolute: 0.4 10*3/uL (ref 0.0–0.7)
Eosinophils Relative: 4.7 % (ref 0.0–5.0)
HCT: 45.5 % (ref 39.0–52.0)
Hemoglobin: 15.6 g/dL (ref 13.0–17.0)
Lymphocytes Relative: 29.5 % (ref 12.0–46.0)
Lymphs Abs: 2.6 10*3/uL (ref 0.7–4.0)
MCHC: 34.3 g/dL (ref 30.0–36.0)
MCV: 85.3 fl (ref 78.0–100.0)
Monocytes Absolute: 0.9 10*3/uL (ref 0.1–1.0)
Monocytes Relative: 9.7 % (ref 3.0–12.0)
Neutro Abs: 5 10*3/uL (ref 1.4–7.7)
Neutrophils Relative %: 55.5 % (ref 43.0–77.0)
Platelets: 337 10*3/uL (ref 150.0–400.0)
RBC: 5.32 Mil/uL (ref 4.22–5.81)
RDW: 13.2 % (ref 11.5–15.5)
WBC: 8.9 10*3/uL (ref 4.0–10.5)

## 2015-03-11 LAB — HEPATIC FUNCTION PANEL
ALT: 30 U/L (ref 0–53)
AST: 22 U/L (ref 0–37)
Albumin: 4.2 g/dL (ref 3.5–5.2)
Alkaline Phosphatase: 67 U/L (ref 39–117)
Bilirubin, Direct: 0.1 mg/dL (ref 0.0–0.3)
Total Bilirubin: 0.6 mg/dL (ref 0.2–1.2)
Total Protein: 7.5 g/dL (ref 6.0–8.3)

## 2015-03-11 LAB — LIPID PANEL
Cholesterol: 202 mg/dL — ABNORMAL HIGH (ref 0–200)
HDL: 56.9 mg/dL (ref >39.00)
LDL Cholesterol: 126 mg/dL — ABNORMAL HIGH (ref 0–99)
NonHDL: 145.1
Total CHOL/HDL Ratio: 4
Triglycerides: 98 mg/dL (ref 0.0–149.0)
VLDL: 19.6 mg/dL (ref 0.0–40.0)

## 2015-03-11 LAB — POCT URINALYSIS DIPSTICK
Bilirubin, UA: NEGATIVE
Blood, UA: NEGATIVE
Glucose, UA: NEGATIVE
Ketones, UA: NEGATIVE
Leukocytes, UA: NEGATIVE
Nitrite, UA: NEGATIVE
Protein, UA: NEGATIVE
Spec Grav, UA: 1.01
Urobilinogen, UA: 0.2
pH, UA: 6

## 2015-03-11 LAB — PSA: PSA: 2.36 ng/mL (ref 0.10–4.00)

## 2015-03-11 LAB — TSH: TSH: 2.16 u[IU]/mL (ref 0.35–4.50)

## 2015-03-11 LAB — TESTOSTERONE: Testosterone: 337.66 ng/dL (ref 300.00–890.00)

## 2015-03-31 ENCOUNTER — Ambulatory Visit (INDEPENDENT_AMBULATORY_CARE_PROVIDER_SITE_OTHER): Payer: BLUE CROSS/BLUE SHIELD | Admitting: Family Medicine

## 2015-03-31 ENCOUNTER — Encounter: Payer: Self-pay | Admitting: Family Medicine

## 2015-03-31 VITALS — BP 139/85 | HR 74 | Temp 98.6°F | Ht 67.75 in | Wt 233.0 lb

## 2015-03-31 DIAGNOSIS — Z0000 Encounter for general adult medical examination without abnormal findings: Secondary | ICD-10-CM | POA: Diagnosis not present

## 2015-03-31 DIAGNOSIS — J301 Allergic rhinitis due to pollen: Secondary | ICD-10-CM | POA: Diagnosis not present

## 2015-03-31 MED ORDER — ALBUTEROL SULFATE HFA 108 (90 BASE) MCG/ACT IN AERS: 2.0000 | INHALATION_SPRAY | RESPIRATORY_TRACT | Status: DC | PRN

## 2015-03-31 MED ORDER — FLUTICASONE PROPIONATE 50 MCG/ACT NA SUSP: 2.0000 | Freq: Every day | NASAL | Status: DC

## 2015-03-31 MED ORDER — AMLODIPINE BESYLATE 10 MG PO TABS: 10.0000 mg | ORAL_TABLET | Freq: Every day | ORAL | Status: DC

## 2015-03-31 NOTE — Progress Notes (Signed)
Subjective:    Patient ID: Micheal Dominguez, male    DOB: May 05, 1966, 49 y.o.   MRN: [id number]  HPI 49 yr old male for a cpx. He feels well. His BP is stable.    Review of Systems  Constitutional: Negative.   HENT: Negative.   Eyes: Negative.   Respiratory: Negative.   Cardiovascular: Negative.   Gastrointestinal: Negative.   Genitourinary: Negative.   Musculoskeletal: Negative.   Skin: Negative.   Neurological: Negative.   Psychiatric/Behavioral: Negative.        Objective:   Physical Exam  Constitutional: He is oriented to person, place, and time. He appears well-developed and well-nourished. No distress.  HENT:  Head: Normocephalic and atraumatic.  Right Ear: External ear normal.  Left Ear: External ear normal.  Nose: Nose normal.  Mouth/Throat: Oropharynx is clear and moist. No oropharyngeal exudate.  Eyes: Conjunctivae and EOM are normal. Pupils are equal, round, and reactive to light. Right eye exhibits no discharge. Left eye exhibits no discharge. No scleral icterus.  Neck: Neck supple. No JVD present. No tracheal deviation present. No thyromegaly present.  Cardiovascular: Normal rate, regular rhythm, normal heart sounds and intact distal pulses.  Exam reveals no gallop and no friction rub.   No murmur heard. EKG shows stable RBBB  Pulmonary/Chest: Effort normal and breath sounds normal. No respiratory distress. He has no wheezes. He has no rales. He exhibits no tenderness.  Abdominal: Soft. Bowel sounds are normal. He exhibits no distension and no mass. There is no tenderness. There is no rebound and no guarding.  Genitourinary: Rectum normal, prostate normal and penis normal. Guaiac negative stool. No penile tenderness.  Musculoskeletal: Normal range of motion. He exhibits no edema or tenderness.  Lymphadenopathy:    He has no cervical adenopathy.  Neurological: He is alert and oriented to person, place, and time. He has normal reflexes. No cranial nerve deficit.  He exhibits normal muscle tone. Coordination normal.  Skin: Skin is warm and dry. No rash noted. He is not diaphoretic. No erythema. No pallor.  Psychiatric: He has a normal mood and affect. His behavior is normal. Judgment and thought content normal.          Assessment & Plan:  Well exam. Refilled meds. He needs to lose 30 lbs and he agrees.

## 2015-03-31 NOTE — Progress Notes (Signed)
Pre visit review using our clinic review tool, if applicable. No additional management support is needed unless otherwise documented below in the visit note. 

## 2015-09-01 ENCOUNTER — Ambulatory Visit (INDEPENDENT_AMBULATORY_CARE_PROVIDER_SITE_OTHER): Payer: BLUE CROSS/BLUE SHIELD | Admitting: *Deleted

## 2015-09-01 DIAGNOSIS — Z23 Encounter for immunization: Secondary | ICD-10-CM | POA: Diagnosis not present

## 2016-03-28 ENCOUNTER — Telehealth: Payer: Self-pay | Admitting: Family Medicine

## 2016-03-28 NOTE — Telephone Encounter (Signed)
Pt would like to see if he should be getting a PSA due to [facility] if, so would like to have it during cpx 04/10/16.

## 2016-03-28 NOTE — Telephone Encounter (Signed)
I left a voice message with below information. 

## 2016-03-28 NOTE — Telephone Encounter (Signed)
He will get a PSA automatically since he is over the age of 56

## 2016-03-30 ENCOUNTER — Other Ambulatory Visit (INDEPENDENT_AMBULATORY_CARE_PROVIDER_SITE_OTHER): Payer: BLUE CROSS/BLUE SHIELD

## 2016-03-30 DIAGNOSIS — Z0000 Encounter for general adult medical examination without abnormal findings: Secondary | ICD-10-CM

## 2016-03-30 LAB — POC URINALSYSI DIPSTICK (AUTOMATED)
Bilirubin, UA: NEGATIVE
Glucose, UA: NEGATIVE
Ketones, UA: NEGATIVE
Leukocytes, UA: NEGATIVE
Nitrite, UA: NEGATIVE
Protein, UA: NEGATIVE
Spec Grav, UA: 1.02
Urobilinogen, UA: 0.2
pH, UA: 5.5

## 2016-03-30 LAB — LIPID PANEL
Cholesterol: 195 mg/dL (ref 0–200)
HDL: 46.4 mg/dL (ref >39.00)
LDL Cholesterol: 133 mg/dL — ABNORMAL HIGH (ref 0–99)
NonHDL: 149
Total CHOL/HDL Ratio: 4
Triglycerides: 81 mg/dL (ref 0.0–149.0)
VLDL: 16.2 mg/dL (ref 0.0–40.0)

## 2016-03-30 LAB — CBC WITH DIFFERENTIAL/PLATELET
Basophils Absolute: 0.1 10*3/uL (ref 0.0–0.1)
Basophils Relative: 0.9 % (ref 0.0–3.0)
Eosinophils Absolute: 0.5 10*3/uL (ref 0.0–0.7)
Eosinophils Relative: 5.5 % — ABNORMAL HIGH (ref 0.0–5.0)
HCT: 43.6 % (ref 39.0–52.0)
Hemoglobin: 14.9 g/dL (ref 13.0–17.0)
Lymphocytes Relative: 26.5 % (ref 12.0–46.0)
Lymphs Abs: 2.5 10*3/uL (ref 0.7–4.0)
MCHC: 34.1 g/dL (ref 30.0–36.0)
MCV: 85.4 fl (ref 78.0–100.0)
Monocytes Absolute: 0.9 10*3/uL (ref 0.1–1.0)
Monocytes Relative: 9.6 % (ref 3.0–12.0)
Neutro Abs: 5.5 10*3/uL (ref 1.4–7.7)
Neutrophils Relative %: 57.5 % (ref 43.0–77.0)
Platelets: 355 10*3/uL (ref 150.0–400.0)
RBC: 5.11 Mil/uL (ref 4.22–5.81)
RDW: 12.9 % (ref 11.5–15.5)
WBC: 9.5 10*3/uL (ref 4.0–10.5)

## 2016-03-30 LAB — TSH: TSH: 1.95 u[IU]/mL (ref 0.35–4.50)

## 2016-03-30 LAB — HEPATIC FUNCTION PANEL
ALT: 43 U/L (ref 0–53)
AST: 26 U/L (ref 0–37)
Albumin: 4.4 g/dL (ref 3.5–5.2)
Alkaline Phosphatase: 62 U/L (ref 39–117)
Bilirubin, Direct: 0.2 mg/dL (ref 0.0–0.3)
Total Bilirubin: 0.7 mg/dL (ref 0.2–1.2)
Total Protein: 7.2 g/dL (ref 6.0–8.3)

## 2016-03-30 LAB — BASIC METABOLIC PANEL
BUN: 17 mg/dL (ref 6–23)
CO2: 27 mEq/L (ref 19–32)
Calcium: 9.3 mg/dL (ref 8.4–10.5)
Chloride: 105 mEq/L (ref 96–112)
Creatinine, Ser: 0.95 mg/dL (ref 0.40–1.50)
GFR: 88.39 mL/min (ref >60.00)
Glucose, Bld: 111 mg/dL — ABNORMAL HIGH (ref 70–99)
Potassium: 4.8 mEq/L (ref 3.5–5.1)
Sodium: 139 mEq/L (ref 135–145)

## 2016-03-30 LAB — PSA: PSA: 1.56 ng/mL (ref 0.10–4.00)

## 2016-04-10 ENCOUNTER — Other Ambulatory Visit: Payer: BLUE CROSS/BLUE SHIELD

## 2016-04-16 ENCOUNTER — Encounter: Payer: BLUE CROSS/BLUE SHIELD | Admitting: Family Medicine

## 2016-04-18 ENCOUNTER — Ambulatory Visit (INDEPENDENT_AMBULATORY_CARE_PROVIDER_SITE_OTHER): Payer: BLUE CROSS/BLUE SHIELD | Admitting: Family Medicine

## 2016-04-18 ENCOUNTER — Encounter: Payer: Self-pay | Admitting: Family Medicine

## 2016-04-18 VITALS — BP 138/88 | HR 71 | Temp 98.0°F | Ht 68.0 in | Wt 238.1 lb

## 2016-04-18 DIAGNOSIS — Z0000 Encounter for general adult medical examination without abnormal findings: Secondary | ICD-10-CM

## 2016-04-18 MED ORDER — TRIAMCINOLONE ACETONIDE 0.1 % EX CREA: 1.0000 "application " | TOPICAL_CREAM | Freq: Two times a day (BID) | CUTANEOUS | Status: DC

## 2016-04-18 MED ORDER — AMLODIPINE BESYLATE 10 MG PO TABS: 10.0000 mg | ORAL_TABLET | Freq: Every day | ORAL | Status: DC

## 2016-04-18 NOTE — Progress Notes (Signed)
Pre visit review using our clinic review tool, if applicable. No additional management support is needed unless otherwise documented below in the visit note. 

## 2016-04-18 NOTE — Progress Notes (Signed)
Subjective:    Patient ID: POLK PHONG, male    DOB: 25-Aug-1966, 50 y.o.   MRN: [id number]  HPI 50 yr old male for a well exam. He feels fine. He admits to not eating healthily and he has several beers every day.    Review of Systems  Constitutional: Negative.   HENT: Negative.   Eyes: Negative.   Respiratory: Negative.   Cardiovascular: Negative.   Gastrointestinal: Negative.   Genitourinary: Negative.   Musculoskeletal: Negative.   Skin: Negative.   Neurological: Negative.   Psychiatric/Behavioral: Negative.        Objective:   Physical Exam  Constitutional: He is oriented to person, place, and time. He appears well-developed and well-nourished. No distress.  HENT:  Head: Normocephalic and atraumatic.  Right Ear: External ear normal.  Left Ear: External ear normal.  Nose: Nose normal.  Mouth/Throat: Oropharynx is clear and moist. No oropharyngeal exudate.  Eyes: Conjunctivae and EOM are normal. Pupils are equal, round, and reactive to light. Right eye exhibits no discharge. Left eye exhibits no discharge. No scleral icterus.  Neck: Neck supple. No JVD present. No tracheal deviation present. No thyromegaly present.  Cardiovascular: Normal rate, regular rhythm, normal heart sounds and intact distal pulses.  Exam reveals no gallop and no friction rub.   No murmur heard. EKG shows stable RBBB   Pulmonary/Chest: Effort normal and breath sounds normal. No respiratory distress. He has no wheezes. He has no rales. He exhibits no tenderness.  Abdominal: Soft. Bowel sounds are normal. He exhibits no distension and no mass. There is no tenderness. There is no rebound and no guarding.  Genitourinary: Rectum normal, prostate normal and penis normal. Guaiac negative stool. No penile tenderness.  Musculoskeletal: Normal range of motion. He exhibits no edema or tenderness.  Lymphadenopathy:    He has no cervical adenopathy.  Neurological: He is alert and oriented to person, place,  and time. He has normal reflexes. No cranial nerve deficit. He exhibits normal muscle tone. Coordination normal.  Skin: Skin is warm and dry. No rash noted. He is not diaphoretic. No erythema. No pallor.  Psychiatric: He has a normal mood and affect. His behavior is normal. Judgment and thought content normal.          Assessment & Plan:  Well exam. We discussed diet and exercise. He will reduce his beer intake.  Nelwyn Salisbury, MD

## 2016-04-25 ENCOUNTER — Encounter: Payer: Self-pay | Admitting: Internal Medicine

## 2016-05-25 ENCOUNTER — Encounter: Payer: Self-pay | Admitting: Internal Medicine

## 2016-05-25 ENCOUNTER — Ambulatory Visit (AMBULATORY_SURGERY_CENTER): Payer: Self-pay | Admitting: *Deleted

## 2016-05-25 VITALS — Ht 68.0 in | Wt 232.4 lb

## 2016-05-25 DIAGNOSIS — Z1211 Encounter for screening for malignant neoplasm of colon: Secondary | ICD-10-CM

## 2016-05-25 MED ORDER — SUPREP BOWEL PREP KIT 17.5-3.13-1.6 GM/177ML PO SOLN: 1.0000 | Freq: Once | ORAL | Status: DC

## 2016-05-25 NOTE — Progress Notes (Signed)
Patient denies any allergies to egg or soy products. Patient denies complications with anesthesia/sedation.  Patient denies oxygen use at home and denies diet medications. Emmi instructions for colonoscopy explained but patient denied.  Pamphlet given.  

## 2016-06-08 ENCOUNTER — Encounter: Payer: Self-pay | Admitting: Internal Medicine

## 2016-06-08 ENCOUNTER — Ambulatory Visit (AMBULATORY_SURGERY_CENTER): Payer: BLUE CROSS/BLUE SHIELD | Admitting: Internal Medicine

## 2016-06-08 VITALS — BP 125/72 | HR 66 | Temp 98.9°F | Resp 13 | Ht 68.0 in | Wt 232.0 lb

## 2016-06-08 DIAGNOSIS — Z1211 Encounter for screening for malignant neoplasm of colon: Secondary | ICD-10-CM

## 2016-06-08 HISTORY — PX: COLONOSCOPY: SHX174

## 2016-06-08 MED ORDER — SODIUM CHLORIDE 0.9 % IV SOLN: 500.0000 mL | INTRAVENOUS | Status: DC

## 2016-06-08 NOTE — Progress Notes (Signed)
To recovery, report to [place], RN, VSS 

## 2016-06-08 NOTE — Op Note (Signed)
[facility] Patient Name: Micheal Dominguez Procedure Date: 06/08/2016 12:44 PM MRN: [id number] Endoscopist: Wilhemina Bonito. Marina Goodell , MD Age: 50 Referring MD:  Date of Birth: 1966-05-08 Gender: Male Account #: [id number] Procedure:                Colonoscopy Indications:              Screening for colorectal malignant neoplasm Medicines:                Monitored Anesthesia Care Procedure:                Pre-Anesthesia Assessment:                           - Prior to the procedure, a History and Physical                            was performed, and patient medications and                            allergies were reviewed. The patient's tolerance of                            previous anesthesia was also reviewed. The risks                            and benefits of the procedure and the sedation                            options and risks were discussed with the patient.                            All questions were answered, and informed consent                            was obtained. Prior Anticoagulants: The patient has                            taken no previous anticoagulant or antiplatelet                            agents. ASA Grade Assessment: II - A patient with                            mild systemic disease. After reviewing the risks                            and benefits, the patient was deemed in                            satisfactory condition to undergo the procedure.                           After obtaining informed consent, the colonoscope  was passed under direct vision. Throughout the                            procedure, the patient's blood pressure, pulse, and                            oxygen saturations were monitored continuously. The                            Model CF-HQ190L [id number]) scope was introduced                            through the anus and advanced to the the cecum,                            identified by  appendiceal orifice and ileocecal                            valve. The ileocecal valve, appendiceal orifice,                            and rectum were photographed. The quality of the                            bowel preparation was excellent. The colonoscopy                            was performed without difficulty. The patient                            tolerated the procedure well. The bowel preparation                            used was SUPREP. Scope In: 1:07:52 PM Scope Out: 1:18:19 PM Scope Withdrawal Time: 0 hours 9 minutes 23 seconds  Total Procedure Duration: 0 hours 10 minutes 27 seconds  Findings:                 Multiple medium-mouthed diverticula were found in                            the entire colon.                           Internal hemorrhoids were found during                            retroflexion. The hemorrhoids were small.                           The exam was otherwise without abnormality on                            direct and retroflexion views. Complications:            No immediate complications. Estimated blood  loss:                            None. Estimated Blood Loss:     Estimated blood loss: none. Impression:               - Diverticulosis in the entire examined colon.                           - Internal hemorrhoids.                           - The examination was otherwise normal on direct                            and retroflexion views.                           - No specimens collected. Recommendation:           - Repeat colonoscopy in 10 years for screening                            purposes.                           - Patient has a contact number available for                            emergencies. The signs and symptoms of potential                            delayed complications were discussed with the                            patient. Return to normal activities tomorrow.                            Written discharge  instructions were provided to the                            patient.                           - Resume previous diet.                           - Continue present medications. Wilhemina Bonito. Marina Goodell, MD 06/08/2016 1:24:35 PM This report has been signed electronically.

## 2016-06-08 NOTE — Patient Instructions (Signed)
YOU HAD AN ENDOSCOPIC PROCEDURE TODAY AT [facility]:   Refer to the procedure report that was given to you for any specific questions about what was found during the examination.  If the procedure report does not answer your questions, please call your gastroenterologist to clarify.  If you requested that your care partner not be given the details of your procedure findings, then the procedure report has been included in a sealed envelope for you to review at your convenience later.  YOU SHOULD EXPECT: Some feelings of bloating in the abdomen. Passage of more gas than usual.  Walking can help get rid of the air that was put into your GI tract during the procedure and reduce the bloating. If you had a lower endoscopy (such as a colonoscopy or flexible sigmoidoscopy) you may notice spotting of blood in your stool or on the toilet paper. If you underwent a bowel prep for your procedure, you may not have a normal bowel movement for a few days.  Please Note:  You might notice some irritation and congestion in your nose or some drainage.  This is from the oxygen used during your procedure.  There is no need for concern and it should clear up in a day or so.  SYMPTOMS TO REPORT IMMEDIATELY:   Following lower endoscopy (colonoscopy or flexible sigmoidoscopy):  Excessive amounts of blood in the stool  Significant tenderness or worsening of abdominal pains  Swelling of the abdomen that is new, acute  Fever of 100F or higher    For urgent or emergent issues, a gastroenterologist can be reached at any hour by calling [phone].   DIET: Your first meal following the procedure should be a small meal and then it is ok to progress to your normal diet. Heavy or fried foods are harder to digest and may make you feel nauseous or bloated.  Likewise, meals heavy in dairy and vegetables can increase bloating.  Drink plenty of fluids but you should avoid alcoholic beverages for 24  hours.  ACTIVITY:  You should plan to take it easy for the rest of today and you should NOT DRIVE or use heavy machinery until tomorrow (because of the sedation medicines used during the test).    FOLLOW UP: Our staff will call the number listed on your records the next business day following your procedure to check on you and address any questions or concerns that you may have regarding the information given to you following your procedure. If we do not reach you, we will leave a message.  However, if you are feeling well and you are not experiencing any problems, there is no need to return our call.  We will assume that you have returned to your regular daily activities without incident.  If any biopsies were taken you will be contacted by phone or by letter within the next 1-3 weeks.  Please call us at [phone] if you have not heard about the biopsies in 3 weeks.    SIGNATURES/CONFIDENTIALITY: You and/or your care partner have signed paperwork which will be entered into your electronic medical record.  These signatures attest to the fact that that the information above on your After Visit Summary has been reviewed and is understood.  Full responsibility of the confidentiality of this discharge information lies with you and/or your care-partner.   Information on diverticulosis ,hemorrhoids ,and high fiber diet given to you today

## 2016-06-11 ENCOUNTER — Telehealth: Payer: Self-pay

## 2016-06-11 NOTE — Telephone Encounter (Signed)
  Follow up Call-  Call back number 06/08/2016  Post procedure Call Back phone  # [phone]  Permission to leave phone message Yes  Some recent data might be hidden     Patient questions:  Do you have a fever, pain , or abdominal swelling? No. Pain Score  0 *  Have you tolerated food without any problems? Yes.    Have you been able to return to your normal activities? Yes.    Do you have any questions about your discharge instructions: Diet   No. Medications  No. Follow up visit  No.  Do you have questions or concerns about your Care? No.  Actions: * If pain score is 4 or above: No action needed, pain <4.

## 2016-08-30 ENCOUNTER — Ambulatory Visit (INDEPENDENT_AMBULATORY_CARE_PROVIDER_SITE_OTHER): Payer: BLUE CROSS/BLUE SHIELD | Admitting: *Deleted

## 2016-08-30 DIAGNOSIS — Z23 Encounter for immunization: Secondary | ICD-10-CM | POA: Diagnosis not present

## 2017-03-04 DIAGNOSIS — M9902 Segmental and somatic dysfunction of thoracic region: Secondary | ICD-10-CM | POA: Diagnosis not present

## 2017-03-04 DIAGNOSIS — M9903 Segmental and somatic dysfunction of lumbar region: Secondary | ICD-10-CM | POA: Diagnosis not present

## 2017-03-04 DIAGNOSIS — M5430 Sciatica, unspecified side: Secondary | ICD-10-CM | POA: Diagnosis not present

## 2017-03-04 DIAGNOSIS — M9904 Segmental and somatic dysfunction of sacral region: Secondary | ICD-10-CM | POA: Diagnosis not present

## 2017-03-05 DIAGNOSIS — M9903 Segmental and somatic dysfunction of lumbar region: Secondary | ICD-10-CM | POA: Diagnosis not present

## 2017-03-05 DIAGNOSIS — M9902 Segmental and somatic dysfunction of thoracic region: Secondary | ICD-10-CM | POA: Diagnosis not present

## 2017-03-05 DIAGNOSIS — M5430 Sciatica, unspecified side: Secondary | ICD-10-CM | POA: Diagnosis not present

## 2017-03-05 DIAGNOSIS — M9901 Segmental and somatic dysfunction of cervical region: Secondary | ICD-10-CM | POA: Diagnosis not present

## 2017-03-06 DIAGNOSIS — M9901 Segmental and somatic dysfunction of cervical region: Secondary | ICD-10-CM | POA: Diagnosis not present

## 2017-03-06 DIAGNOSIS — M9902 Segmental and somatic dysfunction of thoracic region: Secondary | ICD-10-CM | POA: Diagnosis not present

## 2017-03-06 DIAGNOSIS — M5430 Sciatica, unspecified side: Secondary | ICD-10-CM | POA: Diagnosis not present

## 2017-03-06 DIAGNOSIS — M9903 Segmental and somatic dysfunction of lumbar region: Secondary | ICD-10-CM | POA: Diagnosis not present

## 2017-03-11 DIAGNOSIS — M9901 Segmental and somatic dysfunction of cervical region: Secondary | ICD-10-CM | POA: Diagnosis not present

## 2017-03-11 DIAGNOSIS — M9903 Segmental and somatic dysfunction of lumbar region: Secondary | ICD-10-CM | POA: Diagnosis not present

## 2017-03-11 DIAGNOSIS — M9902 Segmental and somatic dysfunction of thoracic region: Secondary | ICD-10-CM | POA: Diagnosis not present

## 2017-03-11 DIAGNOSIS — M5430 Sciatica, unspecified side: Secondary | ICD-10-CM | POA: Diagnosis not present

## 2017-03-12 DIAGNOSIS — M9902 Segmental and somatic dysfunction of thoracic region: Secondary | ICD-10-CM | POA: Diagnosis not present

## 2017-03-12 DIAGNOSIS — M9901 Segmental and somatic dysfunction of cervical region: Secondary | ICD-10-CM | POA: Diagnosis not present

## 2017-03-12 DIAGNOSIS — M5430 Sciatica, unspecified side: Secondary | ICD-10-CM | POA: Diagnosis not present

## 2017-03-12 DIAGNOSIS — M9903 Segmental and somatic dysfunction of lumbar region: Secondary | ICD-10-CM | POA: Diagnosis not present

## 2017-03-13 DIAGNOSIS — M9903 Segmental and somatic dysfunction of lumbar region: Secondary | ICD-10-CM | POA: Diagnosis not present

## 2017-03-13 DIAGNOSIS — M5430 Sciatica, unspecified side: Secondary | ICD-10-CM | POA: Diagnosis not present

## 2017-03-13 DIAGNOSIS — M9902 Segmental and somatic dysfunction of thoracic region: Secondary | ICD-10-CM | POA: Diagnosis not present

## 2017-03-13 DIAGNOSIS — M9901 Segmental and somatic dysfunction of cervical region: Secondary | ICD-10-CM | POA: Diagnosis not present

## 2017-07-21 ENCOUNTER — Other Ambulatory Visit: Payer: Self-pay | Admitting: Family Medicine

## 2017-07-22 ENCOUNTER — Telehealth: Payer: Self-pay | Admitting: Family Medicine

## 2017-07-22 NOTE — Telephone Encounter (Signed)
Pt request refill  amLODipine (NORVASC) 10 MG tablet  Pt has made CPE for 10/09 but has been out of this med for 3 days.  Would like a refill until he can be seen.  [place], [place] [address]

## 2017-07-24 NOTE — Telephone Encounter (Signed)
Script was sent in on 07/22/2017 to [place].

## 2017-08-13 ENCOUNTER — Encounter: Payer: Self-pay | Admitting: Family Medicine

## 2017-08-13 ENCOUNTER — Ambulatory Visit (INDEPENDENT_AMBULATORY_CARE_PROVIDER_SITE_OTHER): Payer: BLUE CROSS/BLUE SHIELD | Admitting: Family Medicine

## 2017-08-13 VITALS — BP 128/88 | Temp 98.4°F | Ht 68.0 in | Wt 243.0 lb

## 2017-08-13 DIAGNOSIS — Z0000 Encounter for general adult medical examination without abnormal findings: Secondary | ICD-10-CM

## 2017-08-13 DIAGNOSIS — Z125 Encounter for screening for malignant neoplasm of prostate: Secondary | ICD-10-CM

## 2017-08-13 LAB — CBC WITH DIFFERENTIAL/PLATELET
Basophils Absolute: 0.1 10*3/uL (ref 0.0–0.1)
Basophils Relative: 1.3 % (ref 0.0–3.0)
Eosinophils Absolute: 0.4 10*3/uL (ref 0.0–0.7)
Eosinophils Relative: 5.7 % — ABNORMAL HIGH (ref 0.0–5.0)
HCT: 43.4 % (ref 39.0–52.0)
Hemoglobin: 14.7 g/dL (ref 13.0–17.0)
Lymphocytes Relative: 23.6 % (ref 12.0–46.0)
Lymphs Abs: 1.9 10*3/uL (ref 0.7–4.0)
MCHC: 34 g/dL (ref 30.0–36.0)
MCV: 87 fl (ref 78.0–100.0)
Monocytes Absolute: 0.8 10*3/uL (ref 0.1–1.0)
Monocytes Relative: 9.6 % (ref 3.0–12.0)
Neutro Abs: 4.7 10*3/uL (ref 1.4–7.7)
Neutrophils Relative %: 59.8 % (ref 43.0–77.0)
Platelets: 355 10*3/uL (ref 150.0–400.0)
RBC: 4.99 Mil/uL (ref 4.22–5.81)
RDW: 12.5 % (ref 11.5–15.5)
WBC: 7.9 10*3/uL (ref 4.0–10.5)

## 2017-08-13 LAB — POC URINALSYSI DIPSTICK (AUTOMATED)
Bilirubin, UA: NEGATIVE
Glucose, UA: NEGATIVE
Ketones, UA: NEGATIVE
Leukocytes, UA: NEGATIVE
Nitrite, UA: NEGATIVE
Protein, UA: NEGATIVE
Spec Grav, UA: 1.025 (ref 1.010–1.025)
Urobilinogen, UA: 0.2 E.U./dL
pH, UA: 6 (ref 5.0–8.0)

## 2017-08-13 LAB — HEPATIC FUNCTION PANEL
ALT: 28 U/L (ref 0–53)
AST: 17 U/L (ref 0–37)
Albumin: 4.4 g/dL (ref 3.5–5.2)
Alkaline Phosphatase: 63 U/L (ref 39–117)
Bilirubin, Direct: 0.1 mg/dL (ref 0.0–0.3)
Total Bilirubin: 0.7 mg/dL (ref 0.2–1.2)
Total Protein: 7.2 g/dL (ref 6.0–8.3)

## 2017-08-13 LAB — TSH: TSH: 2.19 u[IU]/mL (ref 0.35–4.50)

## 2017-08-13 LAB — BASIC METABOLIC PANEL
BUN: 16 mg/dL (ref 6–23)
CO2: 28 mEq/L (ref 19–32)
Calcium: 9.4 mg/dL (ref 8.4–10.5)
Chloride: 101 mEq/L (ref 96–112)
Creatinine, Ser: 0.96 mg/dL (ref 0.40–1.50)
GFR: 86.87 mL/min (ref >60.00)
Glucose, Bld: 119 mg/dL — ABNORMAL HIGH (ref 70–99)
Potassium: 4.8 mEq/L (ref 3.5–5.1)
Sodium: 136 mEq/L (ref 135–145)

## 2017-08-13 LAB — LIPID PANEL
Cholesterol: 206 mg/dL — ABNORMAL HIGH (ref 0–200)
HDL: 52.9 mg/dL (ref >39.00)
LDL Cholesterol: 135 mg/dL — ABNORMAL HIGH (ref 0–99)
NonHDL: 153.56
Total CHOL/HDL Ratio: 4
Triglycerides: 92 mg/dL (ref 0.0–149.0)
VLDL: 18.4 mg/dL (ref 0.0–40.0)

## 2017-08-13 LAB — PSA: PSA: 1.96 ng/mL (ref 0.10–4.00)

## 2017-08-13 MED ORDER — FLUTICASONE PROPIONATE 50 MCG/ACT NA SUSP: 2.0000 | Freq: Every day | NASAL | 3 refills | Status: DC

## 2017-08-13 MED ORDER — ALBUTEROL SULFATE HFA 108 (90 BASE) MCG/ACT IN AERS: 2.0000 | INHALATION_SPRAY | RESPIRATORY_TRACT | 5 refills | Status: DC | PRN

## 2017-08-13 MED ORDER — TRIAMCINOLONE ACETONIDE 0.1 % EX CREA: 1.0000 "application " | TOPICAL_CREAM | Freq: Two times a day (BID) | CUTANEOUS | 2 refills | Status: DC

## 2017-08-13 MED ORDER — AMLODIPINE BESYLATE 10 MG PO TABS: 10.0000 mg | ORAL_TABLET | Freq: Every day | ORAL | 3 refills | Status: DC

## 2017-08-13 NOTE — Patient Instructions (Signed)
WE NOW OFFER   [facility] FAST TRACK!!!  SAME DAY Appointments for ACUTE CARE  Such as: Sprains, Injuries, cuts, abrasions, rashes, muscle pain, joint pain, back pain Colds, flu, sore throats, headache, allergies, cough, fever  Ear pain, sinus and eye infections Abdominal pain, nausea, vomiting, diarrhea, upset stomach Animal/insect bites  3 Easy Ways to Schedule: Walk-In Scheduling Call in scheduling Mychart Sign-up: [email or web address]         

## 2017-08-13 NOTE — Progress Notes (Signed)
Subjective:    Patient ID: Micheal Dominguez, male    DOB: 1966/05/10, 51 y.o.   MRN: [id number]  HPI Here for a well exam. He feels great. His BP is stable.    Review of Systems  Constitutional: Negative.   HENT: Negative.   Eyes: Negative.   Respiratory: Negative.   Cardiovascular: Negative.   Gastrointestinal: Negative.   Genitourinary: Negative.   Musculoskeletal: Negative.   Skin: Negative.   Neurological: Negative.   Psychiatric/Behavioral: Negative.        Objective:   Physical Exam  Constitutional: He is oriented to person, place, and time. He appears well-developed and well-nourished. No distress.  HENT:  Head: Normocephalic and atraumatic.  Right Ear: External ear normal.  Left Ear: External ear normal.  Nose: Nose normal.  Mouth/Throat: Oropharynx is clear and moist. No oropharyngeal exudate.  Eyes: Pupils are equal, round, and reactive to light. Conjunctivae and EOM are normal. Right eye exhibits no discharge. Left eye exhibits no discharge. No scleral icterus.  Neck: Neck supple. No JVD present. No tracheal deviation present. No thyromegaly present.  Cardiovascular: Normal rate, regular rhythm, normal heart sounds and intact distal pulses.  Exam reveals no gallop and no friction rub.   No murmur heard. Pulmonary/Chest: Effort normal and breath sounds normal. No respiratory distress. He has no wheezes. He has no rales. He exhibits no tenderness.  Abdominal: Soft. Bowel sounds are normal. He exhibits no distension and no mass. There is no tenderness. There is no rebound and no guarding.  Genitourinary: Rectum normal, prostate normal and penis normal. Rectal exam shows guaiac negative stool. No penile tenderness.  Musculoskeletal: Normal range of motion. He exhibits no edema or tenderness.  Lymphadenopathy:    He has no cervical adenopathy.  Neurological: He is alert and oriented to person, place, and time. He has normal reflexes. No cranial nerve deficit. He  exhibits normal muscle tone. Coordination normal.  Skin: Skin is warm and dry. No rash noted. He is not diaphoretic. No erythema. No pallor.  Psychiatric: He has a normal mood and affect. His behavior is normal. Judgment and thought content normal.          Assessment & Plan:  Well exam. We discussed diet and exercise. Get fasting labs.  Gershon Crane, MD

## 2017-08-15 ENCOUNTER — Telehealth: Payer: Self-pay | Admitting: Family Medicine

## 2017-08-15 MED ORDER — HYDROCODONE-HOMATROPINE 5-1.5 MG/5ML PO SYRP: 5.0000 mL | ORAL_SOLUTION | ORAL | 0 refills | Status: DC | PRN

## 2017-08-15 NOTE — Telephone Encounter (Signed)
Done

## 2017-08-15 NOTE — Telephone Encounter (Signed)
Script is ready for pick up here at front office and I spoke with pt.  

## 2017-08-15 NOTE — Telephone Encounter (Signed)
Pt requesting a script for cough syrup, was just recently seen and forgot to ask for it, he has a dry hacking cough.

## 2017-08-22 DIAGNOSIS — Z23 Encounter for immunization: Secondary | ICD-10-CM | POA: Diagnosis not present

## 2017-09-17 ENCOUNTER — Telehealth: Payer: Self-pay | Admitting: Family Medicine

## 2017-09-17 MED ORDER — HYDROCODONE-HOMATROPINE 5-1.5 MG/5ML PO SYRP: 5.0000 mL | ORAL_SOLUTION | ORAL | 0 refills | Status: DC | PRN

## 2017-09-17 NOTE — Telephone Encounter (Signed)
Script was printed and given to mom.

## 2017-09-17 NOTE — Telephone Encounter (Signed)
Patient walked in wanting to know if his Rx for cough syrup has been written.   Please Advise

## 2017-09-17 NOTE — Telephone Encounter (Signed)
Request for hydromet, takes for allergies.

## 2017-09-17 NOTE — Telephone Encounter (Signed)
Done. The rx is given to his mother, Aric Jost to take to him

## 2018-01-27 DIAGNOSIS — M9901 Segmental and somatic dysfunction of cervical region: Secondary | ICD-10-CM | POA: Diagnosis not present

## 2018-01-27 DIAGNOSIS — M9903 Segmental and somatic dysfunction of lumbar region: Secondary | ICD-10-CM | POA: Diagnosis not present

## 2018-01-27 DIAGNOSIS — M9902 Segmental and somatic dysfunction of thoracic region: Secondary | ICD-10-CM | POA: Diagnosis not present

## 2018-01-27 DIAGNOSIS — M5412 Radiculopathy, cervical region: Secondary | ICD-10-CM | POA: Diagnosis not present

## 2018-01-28 ENCOUNTER — Ambulatory Visit: Payer: BLUE CROSS/BLUE SHIELD | Admitting: Family Medicine

## 2018-01-28 ENCOUNTER — Encounter: Payer: Self-pay | Admitting: Family Medicine

## 2018-01-28 VITALS — BP 148/92 | HR 93 | Temp 98.3°F | Wt 244.0 lb

## 2018-01-28 DIAGNOSIS — R0789 Other chest pain: Secondary | ICD-10-CM

## 2018-01-28 DIAGNOSIS — I10 Essential (primary) hypertension: Secondary | ICD-10-CM

## 2018-01-28 MED ORDER — METOPROLOL SUCCINATE ER 50 MG PO TB24: 50.0000 mg | ORAL_TABLET | Freq: Every day | ORAL | 3 refills | Status: DC

## 2018-01-28 NOTE — Progress Notes (Signed)
Subjective:    Patient ID: Micheal Dominguez, male    DOB: [birth year], 52 y.o.   MRN: [id number]  HPI Here for symptoms that have bothered him for several weeks. Over the past 2 months he has noticed that his BP has been creeping up. His weight is stable. He has also noticed some mild chest pains and SOB on exertion, mild headaches, and mild tingling in the arms. He says his pulses seem to be stronger than usual. He has a cell phone watch that can record a single lead EKG and he brings in a print out of this. This shows sinus rhythm with occasional PACs and PVCs. He has had RBBB for some time.    Review of Systems  Constitutional: Negative.   Respiratory: Positive for shortness of breath. Negative for cough, chest tightness and wheezing.   Cardiovascular: Positive for chest pain. Negative for palpitations and leg swelling.  Neurological: Positive for headaches. Negative for dizziness, syncope, speech difficulty and light-headedness.       Objective:   Physical Exam  Constitutional: He is oriented to person, place, and time. He appears well-developed and well-nourished.  Neck: No thyromegaly present.  Cardiovascular: Normal rate, regular rhythm, normal heart sounds and intact distal pulses.  Occasional ectopy   Pulmonary/Chest: Effort normal and breath sounds normal. No respiratory distress. He has no wheezes. He has no rales. He exhibits no tenderness.  Lymphadenopathy:    He has no cervical adenopathy.  Neurological: He is alert and oriented to person, place, and time.          Assessment & Plan:  His HTN has worsened a bit, and I think this has caused most of his recent symptoms. We will add Metoprolol succinate 50 mg daily to the Amlodipine. We will set up a perfusion stress test soon. Recheck in 2 weeks.  Gershon Crane, MD

## 2018-01-29 DIAGNOSIS — M9903 Segmental and somatic dysfunction of lumbar region: Secondary | ICD-10-CM | POA: Diagnosis not present

## 2018-01-29 DIAGNOSIS — M9902 Segmental and somatic dysfunction of thoracic region: Secondary | ICD-10-CM | POA: Diagnosis not present

## 2018-01-29 DIAGNOSIS — M5412 Radiculopathy, cervical region: Secondary | ICD-10-CM | POA: Diagnosis not present

## 2018-01-29 DIAGNOSIS — M9901 Segmental and somatic dysfunction of cervical region: Secondary | ICD-10-CM | POA: Diagnosis not present

## 2018-01-30 DIAGNOSIS — M9902 Segmental and somatic dysfunction of thoracic region: Secondary | ICD-10-CM | POA: Diagnosis not present

## 2018-01-30 DIAGNOSIS — M9903 Segmental and somatic dysfunction of lumbar region: Secondary | ICD-10-CM | POA: Diagnosis not present

## 2018-01-30 DIAGNOSIS — M5412 Radiculopathy, cervical region: Secondary | ICD-10-CM | POA: Diagnosis not present

## 2018-01-30 DIAGNOSIS — M9901 Segmental and somatic dysfunction of cervical region: Secondary | ICD-10-CM | POA: Diagnosis not present

## 2018-02-03 DIAGNOSIS — M9901 Segmental and somatic dysfunction of cervical region: Secondary | ICD-10-CM | POA: Diagnosis not present

## 2018-02-03 DIAGNOSIS — M5412 Radiculopathy, cervical region: Secondary | ICD-10-CM | POA: Diagnosis not present

## 2018-02-03 DIAGNOSIS — M9902 Segmental and somatic dysfunction of thoracic region: Secondary | ICD-10-CM | POA: Diagnosis not present

## 2018-02-03 DIAGNOSIS — M9903 Segmental and somatic dysfunction of lumbar region: Secondary | ICD-10-CM | POA: Diagnosis not present

## 2018-02-05 DIAGNOSIS — M9903 Segmental and somatic dysfunction of lumbar region: Secondary | ICD-10-CM | POA: Diagnosis not present

## 2018-02-05 DIAGNOSIS — M9902 Segmental and somatic dysfunction of thoracic region: Secondary | ICD-10-CM | POA: Diagnosis not present

## 2018-02-05 DIAGNOSIS — M5412 Radiculopathy, cervical region: Secondary | ICD-10-CM | POA: Diagnosis not present

## 2018-02-05 DIAGNOSIS — M9901 Segmental and somatic dysfunction of cervical region: Secondary | ICD-10-CM | POA: Diagnosis not present

## 2018-02-10 DIAGNOSIS — M5412 Radiculopathy, cervical region: Secondary | ICD-10-CM | POA: Diagnosis not present

## 2018-02-10 DIAGNOSIS — M9901 Segmental and somatic dysfunction of cervical region: Secondary | ICD-10-CM | POA: Diagnosis not present

## 2018-02-10 DIAGNOSIS — M9903 Segmental and somatic dysfunction of lumbar region: Secondary | ICD-10-CM | POA: Diagnosis not present

## 2018-02-10 DIAGNOSIS — M9902 Segmental and somatic dysfunction of thoracic region: Secondary | ICD-10-CM | POA: Diagnosis not present

## 2018-02-12 DIAGNOSIS — M9903 Segmental and somatic dysfunction of lumbar region: Secondary | ICD-10-CM | POA: Diagnosis not present

## 2018-02-12 DIAGNOSIS — M9902 Segmental and somatic dysfunction of thoracic region: Secondary | ICD-10-CM | POA: Diagnosis not present

## 2018-02-12 DIAGNOSIS — M9901 Segmental and somatic dysfunction of cervical region: Secondary | ICD-10-CM | POA: Diagnosis not present

## 2018-02-12 DIAGNOSIS — M5412 Radiculopathy, cervical region: Secondary | ICD-10-CM | POA: Diagnosis not present

## 2018-02-13 DIAGNOSIS — M9902 Segmental and somatic dysfunction of thoracic region: Secondary | ICD-10-CM | POA: Diagnosis not present

## 2018-02-13 DIAGNOSIS — M5412 Radiculopathy, cervical region: Secondary | ICD-10-CM | POA: Diagnosis not present

## 2018-02-13 DIAGNOSIS — M9901 Segmental and somatic dysfunction of cervical region: Secondary | ICD-10-CM | POA: Diagnosis not present

## 2018-02-13 DIAGNOSIS — M9903 Segmental and somatic dysfunction of lumbar region: Secondary | ICD-10-CM | POA: Diagnosis not present

## 2018-02-17 DIAGNOSIS — M9903 Segmental and somatic dysfunction of lumbar region: Secondary | ICD-10-CM | POA: Diagnosis not present

## 2018-02-17 DIAGNOSIS — M9901 Segmental and somatic dysfunction of cervical region: Secondary | ICD-10-CM | POA: Diagnosis not present

## 2018-02-17 DIAGNOSIS — M5412 Radiculopathy, cervical region: Secondary | ICD-10-CM | POA: Diagnosis not present

## 2018-02-17 DIAGNOSIS — M9902 Segmental and somatic dysfunction of thoracic region: Secondary | ICD-10-CM | POA: Diagnosis not present

## 2018-02-24 DIAGNOSIS — M9902 Segmental and somatic dysfunction of thoracic region: Secondary | ICD-10-CM | POA: Diagnosis not present

## 2018-02-24 DIAGNOSIS — M5412 Radiculopathy, cervical region: Secondary | ICD-10-CM | POA: Diagnosis not present

## 2018-02-24 DIAGNOSIS — M9901 Segmental and somatic dysfunction of cervical region: Secondary | ICD-10-CM | POA: Diagnosis not present

## 2018-02-24 DIAGNOSIS — M9903 Segmental and somatic dysfunction of lumbar region: Secondary | ICD-10-CM | POA: Diagnosis not present

## 2018-03-05 DIAGNOSIS — M5412 Radiculopathy, cervical region: Secondary | ICD-10-CM | POA: Diagnosis not present

## 2018-03-05 DIAGNOSIS — M9903 Segmental and somatic dysfunction of lumbar region: Secondary | ICD-10-CM | POA: Diagnosis not present

## 2018-03-05 DIAGNOSIS — M9901 Segmental and somatic dysfunction of cervical region: Secondary | ICD-10-CM | POA: Diagnosis not present

## 2018-03-05 DIAGNOSIS — M9902 Segmental and somatic dysfunction of thoracic region: Secondary | ICD-10-CM | POA: Diagnosis not present

## 2018-05-07 DIAGNOSIS — M9902 Segmental and somatic dysfunction of thoracic region: Secondary | ICD-10-CM | POA: Diagnosis not present

## 2018-05-07 DIAGNOSIS — M5412 Radiculopathy, cervical region: Secondary | ICD-10-CM | POA: Diagnosis not present

## 2018-05-07 DIAGNOSIS — M9901 Segmental and somatic dysfunction of cervical region: Secondary | ICD-10-CM | POA: Diagnosis not present

## 2018-05-07 DIAGNOSIS — M9903 Segmental and somatic dysfunction of lumbar region: Secondary | ICD-10-CM | POA: Diagnosis not present

## 2018-07-04 ENCOUNTER — Other Ambulatory Visit: Payer: Self-pay | Admitting: Family Medicine

## 2018-07-04 NOTE — Telephone Encounter (Signed)
Last OV 01/28/2017   Last refilled 01/28/2018 disp 30 with 3 refills   Pt is due for an OV for more refills

## 2018-07-08 NOTE — Telephone Encounter (Signed)
Called pt and left a VM to call back to schedule for their appointment.

## 2018-08-04 ENCOUNTER — Ambulatory Visit: Payer: BLUE CROSS/BLUE SHIELD | Admitting: Family Medicine

## 2018-08-04 ENCOUNTER — Encounter: Payer: Self-pay | Admitting: Family Medicine

## 2018-08-04 VITALS — BP 154/96 | HR 64 | Temp 98.5°F | Wt 244.4 lb

## 2018-08-04 DIAGNOSIS — G4730 Sleep apnea, unspecified: Secondary | ICD-10-CM

## 2018-08-04 DIAGNOSIS — I10 Essential (primary) hypertension: Secondary | ICD-10-CM | POA: Diagnosis not present

## 2018-08-04 DIAGNOSIS — R0602 Shortness of breath: Secondary | ICD-10-CM

## 2018-08-04 MED ORDER — AMLODIPINE BESYLATE 10 MG PO TABS: 10.0000 mg | ORAL_TABLET | Freq: Every day | ORAL | 3 refills | Status: DC

## 2018-08-04 MED ORDER — METOPROLOL SUCCINATE ER 50 MG PO TB24: 50.0000 mg | ORAL_TABLET | Freq: Every day | ORAL | 3 refills | Status: DC

## 2018-08-04 NOTE — Progress Notes (Signed)
Subjective:    Patient ID: Micheal Dominguez, male    DOB: 1966/01/17, 52 y.o.   MRN: [id number]  HPI Here to follow up on BP and for other concerns. When we last saw him in March we started him on Metoprolol in addition to his Amlodipine. He took this for 3 months and did well. He says his BP was stable. However he got busy on his job and he did not get the Metoprolol refilled. He also comnplains of feeling SOB when he lies in bed at night. No chest pain. His wife says he snores a lot. He feels tired and sleepy all through the day. His sister is treated for sleep apnea. We had ordered a cardiac stress test last spring but he never set this up.   Review of Systems  Constitutional: Positive for fatigue.  Respiratory: Positive for shortness of breath.   Cardiovascular: Negative.   Gastrointestinal: Negative.   Genitourinary: Negative.   Neurological: Negative.        Objective:   Physical Exam  Constitutional: He is oriented to person, place, and time. He appears well-developed and well-nourished.  Obese   Cardiovascular: Normal rate, regular rhythm, normal heart sounds and intact distal pulses.  Pulmonary/Chest: Effort normal and breath sounds normal. No stridor. No respiratory distress. He has no wheezes. He has no rales.  Neurological: He is alert and oriented to person, place, and time.          Assessment & Plan:  His HTN is not well controlled. We will start him back on Metoprolol succinate 50 mg daily. Set up a myocardial perfusion study. He likely has sleep apnea so we will refer him to Pulmonary to evaluate this. He will return next month for a well exam , and we will re-evaluate this issues then.  Gershon Crane, MD

## 2018-08-11 ENCOUNTER — Telehealth (HOSPITAL_COMMUNITY): Payer: Self-pay | Admitting: *Deleted

## 2018-08-11 NOTE — Telephone Encounter (Signed)
Patient given detailed instructions per Myocardial Perfusion Study Information Sheet for the test on 08/12/18. Patient notified to arrive 15 minutes early and that it is imperative to arrive on time for appointment to keep from having the test rescheduled.  If you need to cancel or reschedule your appointment, please call the office within 24 hours of your appointment. . Patient verbalized understanding. Kirstie Peri

## 2018-08-12 ENCOUNTER — Ambulatory Visit (HOSPITAL_COMMUNITY): Payer: BLUE CROSS/BLUE SHIELD | Attending: Cardiovascular Disease

## 2018-08-12 DIAGNOSIS — R0602 Shortness of breath: Secondary | ICD-10-CM | POA: Insufficient documentation

## 2018-08-12 LAB — MYOCARDIAL PERFUSION IMAGING
Estimated workload: 11.7 METS
Exercise duration (min): 10 min
Exercise duration (sec): 0 s
LV dias vol: 111 mL (ref 62–150)
LV sys vol: 50 mL
MPHR: 168 {beats}/min
Peak HR: 169 {beats}/min
Percent HR: 101 %
Rest HR: 58 {beats}/min
SDS: 1
SRS: 0
SSS: 1
TID: 0.99

## 2018-08-12 MED ORDER — TECHNETIUM TC 99M TETROFOSMIN IV KIT
31.4000 | PACK | Freq: Once | INTRAVENOUS | Status: AC | PRN
  Administered 2018-08-12: 31.4 via INTRAVENOUS
  Filled 2018-08-12: qty 32

## 2018-08-12 MED ORDER — TECHNETIUM TC 99M TETROFOSMIN IV KIT
10.8000 | PACK | Freq: Once | INTRAVENOUS | Status: AC | PRN
  Administered 2018-08-12: 10.8 via INTRAVENOUS
  Filled 2018-08-12: qty 11

## 2018-08-19 DIAGNOSIS — K1329 Other disturbances of oral epithelium, including tongue: Secondary | ICD-10-CM | POA: Diagnosis not present

## 2018-08-20 DIAGNOSIS — C069 Malignant neoplasm of mouth, unspecified: Secondary | ICD-10-CM | POA: Diagnosis not present

## 2018-08-27 DIAGNOSIS — Z23 Encounter for immunization: Secondary | ICD-10-CM | POA: Diagnosis not present

## 2018-08-28 DIAGNOSIS — Z87891 Personal history of nicotine dependence: Secondary | ICD-10-CM | POA: Diagnosis not present

## 2018-08-28 DIAGNOSIS — C069 Malignant neoplasm of mouth, unspecified: Secondary | ICD-10-CM | POA: Diagnosis not present

## 2018-08-28 DIAGNOSIS — K1321 Leukoplakia of oral mucosa, including tongue: Secondary | ICD-10-CM | POA: Diagnosis not present

## 2018-08-29 ENCOUNTER — Other Ambulatory Visit: Payer: Self-pay | Admitting: Otolaryngology

## 2018-08-29 ENCOUNTER — Encounter: Payer: BLUE CROSS/BLUE SHIELD | Admitting: Family Medicine

## 2018-08-29 DIAGNOSIS — C069 Malignant neoplasm of mouth, unspecified: Secondary | ICD-10-CM

## 2018-09-01 ENCOUNTER — Ambulatory Visit (INDEPENDENT_AMBULATORY_CARE_PROVIDER_SITE_OTHER): Payer: BLUE CROSS/BLUE SHIELD | Admitting: Family Medicine

## 2018-09-01 ENCOUNTER — Encounter: Payer: Self-pay | Admitting: Family Medicine

## 2018-09-01 ENCOUNTER — Inpatient Hospital Stay
Admission: RE | Admit: 2018-09-01 | Discharge: 2018-09-01 | Disposition: A | Discharge status: Home / Self Care | Payer: BLUE CROSS/BLUE SHIELD | Source: Ambulatory Visit | Attending: Otolaryngology | Admitting: Otolaryngology

## 2018-09-01 ENCOUNTER — Ambulatory Visit
Admission: RE | Admit: 2018-09-01 | Discharge: 2018-09-01 | Disposition: A | Discharge status: Home / Self Care | Payer: BLUE CROSS/BLUE SHIELD | Source: Ambulatory Visit | Attending: Otolaryngology | Admitting: Otolaryngology

## 2018-09-01 VITALS — BP 126/82 | HR 55 | Temp 97.9°F | Ht 68.0 in | Wt 242.4 lb

## 2018-09-01 DIAGNOSIS — Z0000 Encounter for general adult medical examination without abnormal findings: Secondary | ICD-10-CM | POA: Diagnosis not present

## 2018-09-01 DIAGNOSIS — C069 Malignant neoplasm of mouth, unspecified: Secondary | ICD-10-CM

## 2018-09-01 LAB — CBC WITH DIFFERENTIAL/PLATELET
Basophils Absolute: 0.1 10*3/uL (ref 0.0–0.1)
Basophils Relative: 0.7 % (ref 0.0–3.0)
Eosinophils Absolute: 0.5 10*3/uL (ref 0.0–0.7)
Eosinophils Relative: 5.1 % — ABNORMAL HIGH (ref 0.0–5.0)
HCT: 43.7 % (ref 39.0–52.0)
Hemoglobin: 14.8 g/dL (ref 13.0–17.0)
Lymphocytes Relative: 23.7 % (ref 12.0–46.0)
Lymphs Abs: 2.3 10*3/uL (ref 0.7–4.0)
MCHC: 33.9 g/dL (ref 30.0–36.0)
MCV: 86.9 fl (ref 78.0–100.0)
Monocytes Absolute: 1 10*3/uL (ref 0.1–1.0)
Monocytes Relative: 10.2 % (ref 3.0–12.0)
Neutro Abs: 5.8 10*3/uL (ref 1.4–7.7)
Neutrophils Relative %: 60.3 % (ref 43.0–77.0)
Platelets: 342 10*3/uL (ref 150.0–400.0)
RBC: 5.02 Mil/uL (ref 4.22–5.81)
RDW: 12.5 % (ref 11.5–15.5)
WBC: 9.6 10*3/uL (ref 4.0–10.5)

## 2018-09-01 LAB — BASIC METABOLIC PANEL
BUN: 13 mg/dL (ref 6–23)
CO2: 29 mEq/L (ref 19–32)
Calcium: 9.5 mg/dL (ref 8.4–10.5)
Chloride: 101 mEq/L (ref 96–112)
Creatinine, Ser: 0.88 mg/dL (ref 0.40–1.50)
GFR: 95.67 mL/min (ref >60.00)
Glucose, Bld: 116 mg/dL — ABNORMAL HIGH (ref 70–99)
Potassium: 4.7 mEq/L (ref 3.5–5.1)
Sodium: 138 mEq/L (ref 135–145)

## 2018-09-01 LAB — LIPID PANEL
Cholesterol: 208 mg/dL — ABNORMAL HIGH (ref 0–200)
HDL: 59.5 mg/dL (ref >39.00)
LDL Cholesterol: 128 mg/dL — ABNORMAL HIGH (ref 0–99)
NonHDL: 148.94
Total CHOL/HDL Ratio: 4
Triglycerides: 107 mg/dL (ref 0.0–149.0)
VLDL: 21.4 mg/dL (ref 0.0–40.0)

## 2018-09-01 LAB — POC URINALSYSI DIPSTICK (AUTOMATED)
Bilirubin, UA: NEGATIVE
Blood, UA: NEGATIVE
Glucose, UA: NEGATIVE
Ketones, UA: NEGATIVE
Leukocytes, UA: NEGATIVE
Nitrite, UA: NEGATIVE
Protein, UA: NEGATIVE
Spec Grav, UA: 1.02 (ref 1.010–1.025)
Urobilinogen, UA: 0.2 E.U./dL
pH, UA: 6.5 (ref 5.0–8.0)

## 2018-09-01 LAB — HEPATIC FUNCTION PANEL
ALT: 24 U/L (ref 0–53)
AST: 16 U/L (ref 0–37)
Albumin: 4.5 g/dL (ref 3.5–5.2)
Alkaline Phosphatase: 70 U/L (ref 39–117)
Bilirubin, Direct: 0.2 mg/dL (ref 0.0–0.3)
Total Bilirubin: 0.9 mg/dL (ref 0.2–1.2)
Total Protein: 7.5 g/dL (ref 6.0–8.3)

## 2018-09-01 LAB — PSA: PSA: 2.58 ng/mL (ref 0.10–4.00)

## 2018-09-01 LAB — TSH: TSH: 1.91 u[IU]/mL (ref 0.35–4.50)

## 2018-09-01 MED ORDER — ONDANSETRON HCL 4 MG/2ML IJ SOLN
4.0000 mg | Freq: Once | INTRAMUSCULAR | Status: AC
  Administered 2018-09-01: 4 mg via INTRAVENOUS

## 2018-09-01 MED ORDER — IOPAMIDOL (ISOVUE-300) INJECTION 61%
75.0000 mL | Freq: Once | INTRAVENOUS | Status: AC | PRN
  Administered 2018-09-01: 75 mL via INTRAVENOUS

## 2018-09-01 NOTE — Progress Notes (Signed)
During CT of neck, pt became very nauseous, vomiting multiple times. Per Dr. Jola Baptist (at bedside) verbal order, 4mg  of Zofran given IV. Pt tolerated well and was feeling less nauseous.

## 2018-09-01 NOTE — Progress Notes (Signed)
Subjective:    Patient ID: Micheal Dominguez, male    DOB: April 16, 1966, 52 y.o.   MRN: [id number]  HPI Here for a wel exam. He feels fine. 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 This revealed squamous cell cancer and he was sent to ENT. He saw Dr. Suzanna Obey last week and he has ordered a CT for staging. He will need surgery for this of course. He did chew tobacco for 30 years prior to quitting a few weeks ago.    Review of Systems  Constitutional: Negative.   HENT: Negative.   Eyes: Negative.   Respiratory: Negative.   Cardiovascular: Negative.   Gastrointestinal: Negative.   Genitourinary: Negative.   Musculoskeletal: Negative.   Skin: Negative.   Neurological: Negative.   Psychiatric/Behavioral: Negative.        Objective:   Physical Exam  Constitutional: He is oriented to person, place, and time. He appears well-developed and well-nourished. No distress.  HENT:  Head: Normocephalic and atraumatic.  Right Ear: External ear normal.  Left Ear: External ear normal.  Nose: Nose normal.  Mouth/Throat: No oropharyngeal exudate.  He has leukoplakia on both buccal surfaces   Eyes: Pupils are equal, round, and reactive to light. Conjunctivae and EOM are normal. Right eye exhibits no discharge. Left eye exhibits no discharge. No scleral icterus.  Neck: Neck supple. No JVD present. No tracheal deviation present. No thyromegaly present.  Cardiovascular: Normal rate, regular rhythm, normal heart sounds and intact distal pulses. Exam reveals no gallop and no friction rub.  No murmur heard. Pulmonary/Chest: Effort normal and breath sounds normal. No respiratory distress. He has no wheezes. He has no rales. He exhibits no tenderness.  Abdominal: Soft. Bowel sounds are normal. He exhibits no distension and no mass. There is no tenderness. There is no rebound and no guarding.  Genitourinary: Rectum normal, prostate  normal and penis normal. Rectal exam shows guaiac negative stool. No penile tenderness.  Musculoskeletal: Normal range of motion. He exhibits no edema or tenderness.  Lymphadenopathy:    He has no cervical adenopathy.  Neurological: He is alert and oriented to person, place, and time. He has normal reflexes. He displays normal reflexes. No cranial nerve deficit. He exhibits normal muscle tone. Coordination normal.  Skin: Skin is warm and dry. No rash noted. He is not diaphoretic. No erythema. No pallor.  Psychiatric: He has a normal mood and affect. His behavior is normal. Judgment and thought content normal.          Assessment & Plan:  Well exam. We discussed diet and exercise. Get fasting labs.  Gershon Crane, MD

## 2018-09-02 DIAGNOSIS — K1329 Other disturbances of oral epithelium, including tongue: Secondary | ICD-10-CM | POA: Diagnosis not present

## 2018-09-04 ENCOUNTER — Telehealth: Payer: Self-pay | Admitting: *Deleted

## 2018-09-04 NOTE — Telephone Encounter (Signed)
Copied from [place] [id number]. Topic: General - Other >> Sep 04, 2018  4:26 PM Janace Aris A wrote: Reason for CRM: Pt called in to speak with lee, says she called him in regards to his lab results. He would like a call back regarding  this.   Please advise  Pt says he don't mind a e-mail regarding this also. [email or web address]

## 2018-09-05 DIAGNOSIS — M25562 Pain in left knee: Secondary | ICD-10-CM | POA: Diagnosis not present

## 2018-09-05 NOTE — Telephone Encounter (Signed)
[id number] x 2  

## 2018-09-10 DIAGNOSIS — C760 Malignant neoplasm of head, face and neck: Secondary | ICD-10-CM | POA: Diagnosis not present

## 2018-09-10 DIAGNOSIS — R911 Solitary pulmonary nodule: Secondary | ICD-10-CM | POA: Diagnosis not present

## 2018-09-10 DIAGNOSIS — C060 Malignant neoplasm of cheek mucosa: Secondary | ICD-10-CM | POA: Diagnosis not present

## 2018-09-10 DIAGNOSIS — C069 Malignant neoplasm of mouth, unspecified: Secondary | ICD-10-CM | POA: Diagnosis not present

## 2018-09-10 DIAGNOSIS — K1379 Other lesions of oral mucosa: Secondary | ICD-10-CM | POA: Diagnosis not present

## 2018-09-10 DIAGNOSIS — C0689 Malignant neoplasm of overlapping sites of other parts of mouth: Secondary | ICD-10-CM | POA: Diagnosis not present

## 2018-09-10 DIAGNOSIS — Z87891 Personal history of nicotine dependence: Secondary | ICD-10-CM | POA: Diagnosis not present

## 2018-09-11 DIAGNOSIS — C060 Malignant neoplasm of cheek mucosa: Secondary | ICD-10-CM | POA: Insufficient documentation

## 2018-09-17 DIAGNOSIS — M25562 Pain in left knee: Secondary | ICD-10-CM | POA: Diagnosis not present

## 2018-09-22 DIAGNOSIS — I4510 Unspecified right bundle-branch block: Secondary | ICD-10-CM | POA: Insufficient documentation

## 2018-09-22 DIAGNOSIS — M25562 Pain in left knee: Secondary | ICD-10-CM | POA: Diagnosis not present

## 2018-09-22 DIAGNOSIS — K148 Other diseases of tongue: Secondary | ICD-10-CM | POA: Diagnosis not present

## 2018-09-22 DIAGNOSIS — K118 Other diseases of salivary glands: Secondary | ICD-10-CM | POA: Diagnosis not present

## 2018-09-22 DIAGNOSIS — C060 Malignant neoplasm of cheek mucosa: Secondary | ICD-10-CM | POA: Diagnosis not present

## 2018-09-22 DIAGNOSIS — Z87891 Personal history of nicotine dependence: Secondary | ICD-10-CM | POA: Diagnosis not present

## 2018-09-22 DIAGNOSIS — E669 Obesity, unspecified: Secondary | ICD-10-CM | POA: Insufficient documentation

## 2018-09-22 DIAGNOSIS — R9431 Abnormal electrocardiogram [ECG] [EKG]: Secondary | ICD-10-CM | POA: Diagnosis not present

## 2018-09-22 DIAGNOSIS — Z8249 Family history of ischemic heart disease and other diseases of the circulatory system: Secondary | ICD-10-CM | POA: Diagnosis not present

## 2018-09-22 DIAGNOSIS — C039 Malignant neoplasm of gum, unspecified: Secondary | ICD-10-CM | POA: Diagnosis not present

## 2018-09-22 DIAGNOSIS — C411 Malignant neoplasm of mandible: Secondary | ICD-10-CM | POA: Diagnosis not present

## 2018-09-22 DIAGNOSIS — Z6838 Body mass index (BMI) 38.0-38.9, adult: Secondary | ICD-10-CM | POA: Diagnosis not present

## 2018-09-22 DIAGNOSIS — Z880 Allergy status to penicillin: Secondary | ICD-10-CM | POA: Diagnosis not present

## 2018-09-22 DIAGNOSIS — C069 Malignant neoplasm of mouth, unspecified: Secondary | ICD-10-CM | POA: Diagnosis not present

## 2018-09-22 DIAGNOSIS — C030 Malignant neoplasm of upper gum: Secondary | ICD-10-CM | POA: Diagnosis not present

## 2018-09-22 DIAGNOSIS — Z7982 Long term (current) use of aspirin: Secondary | ICD-10-CM | POA: Diagnosis not present

## 2018-09-22 DIAGNOSIS — K1239 Other oral mucositis (ulcerative): Secondary | ICD-10-CM | POA: Diagnosis not present

## 2018-09-22 DIAGNOSIS — Z4659 Encounter for fitting and adjustment of other gastrointestinal appliance and device: Secondary | ICD-10-CM | POA: Diagnosis not present

## 2018-09-22 DIAGNOSIS — K121 Other forms of stomatitis: Secondary | ICD-10-CM | POA: Diagnosis not present

## 2018-09-22 DIAGNOSIS — J309 Allergic rhinitis, unspecified: Secondary | ICD-10-CM | POA: Diagnosis not present

## 2018-09-22 DIAGNOSIS — K1321 Leukoplakia of oral mucosa, including tongue: Secondary | ICD-10-CM | POA: Diagnosis not present

## 2018-09-22 DIAGNOSIS — K1329 Other disturbances of oral epithelium, including tongue: Secondary | ICD-10-CM | POA: Diagnosis not present

## 2018-09-22 DIAGNOSIS — I10 Essential (primary) hypertension: Secondary | ICD-10-CM | POA: Diagnosis not present

## 2018-09-22 DIAGNOSIS — Z91041 Radiographic dye allergy status: Secondary | ICD-10-CM | POA: Diagnosis not present

## 2018-09-23 DIAGNOSIS — M25562 Pain in left knee: Secondary | ICD-10-CM | POA: Diagnosis not present

## 2018-09-25 DIAGNOSIS — C039 Malignant neoplasm of gum, unspecified: Secondary | ICD-10-CM | POA: Insufficient documentation

## 2018-09-25 DIAGNOSIS — C411 Malignant neoplasm of mandible: Secondary | ICD-10-CM | POA: Diagnosis not present

## 2018-09-25 DIAGNOSIS — C069 Malignant neoplasm of mouth, unspecified: Secondary | ICD-10-CM | POA: Diagnosis not present

## 2018-09-25 DIAGNOSIS — C060 Malignant neoplasm of cheek mucosa: Secondary | ICD-10-CM | POA: Diagnosis not present

## 2018-09-25 DIAGNOSIS — K148 Other diseases of tongue: Secondary | ICD-10-CM | POA: Diagnosis not present

## 2018-09-25 DIAGNOSIS — K1329 Other disturbances of oral epithelium, including tongue: Secondary | ICD-10-CM | POA: Diagnosis not present

## 2018-09-25 DIAGNOSIS — K1321 Leukoplakia of oral mucosa, including tongue: Secondary | ICD-10-CM | POA: Diagnosis not present

## 2018-09-25 DIAGNOSIS — K1239 Other oral mucositis (ulcerative): Secondary | ICD-10-CM | POA: Diagnosis not present

## 2018-09-25 DIAGNOSIS — K118 Other diseases of salivary glands: Secondary | ICD-10-CM | POA: Diagnosis not present

## 2018-09-25 DIAGNOSIS — Z4659 Encounter for fitting and adjustment of other gastrointestinal appliance and device: Secondary | ICD-10-CM | POA: Diagnosis not present

## 2018-09-25 HISTORY — PX: MANDIBLE SURGERY: SHX707

## 2018-09-29 NOTE — Telephone Encounter (Signed)
Have been unable to contact the pt.  Have called again and lmom to call back

## 2018-10-18 DIAGNOSIS — M25561 Pain in right knee: Secondary | ICD-10-CM | POA: Diagnosis not present

## 2018-10-18 DIAGNOSIS — M25562 Pain in left knee: Secondary | ICD-10-CM | POA: Diagnosis not present

## 2018-12-05 DIAGNOSIS — M25562 Pain in left knee: Secondary | ICD-10-CM | POA: Diagnosis not present

## 2019-01-02 DIAGNOSIS — M170 Bilateral primary osteoarthritis of knee: Secondary | ICD-10-CM | POA: Diagnosis not present

## 2019-01-02 DIAGNOSIS — M25562 Pain in left knee: Secondary | ICD-10-CM | POA: Diagnosis not present

## 2019-01-20 ENCOUNTER — Encounter (HOSPITAL_COMMUNITY): Payer: Self-pay | Admitting: *Deleted

## 2019-01-20 ENCOUNTER — Other Ambulatory Visit: Payer: Self-pay

## 2019-01-21 ENCOUNTER — Encounter (HOSPITAL_COMMUNITY): Payer: Self-pay | Admitting: General Practice

## 2019-01-21 ENCOUNTER — Encounter (HOSPITAL_COMMUNITY)
Admission: RE | Disposition: A | Discharge status: Home / Self Care | Payer: Self-pay | Source: Home / Self Care | Attending: Orthopedic Surgery

## 2019-01-21 ENCOUNTER — Ambulatory Visit (HOSPITAL_COMMUNITY): Payer: BLUE CROSS/BLUE SHIELD

## 2019-01-21 ENCOUNTER — Ambulatory Visit (HOSPITAL_COMMUNITY)
Admission: RE | Admit: 2019-01-21 | Discharge: 2019-01-21 | Disposition: A | Discharge status: Home / Self Care | Payer: BLUE CROSS/BLUE SHIELD | Attending: Orthopedic Surgery | Admitting: Orthopedic Surgery

## 2019-01-21 ENCOUNTER — Ambulatory Visit (HOSPITAL_COMMUNITY): Payer: BLUE CROSS/BLUE SHIELD | Admitting: Physician Assistant

## 2019-01-21 ENCOUNTER — Telehealth (HOSPITAL_COMMUNITY): Payer: Self-pay | Admitting: *Deleted

## 2019-01-21 DIAGNOSIS — Z87891 Personal history of nicotine dependence: Secondary | ICD-10-CM | POA: Diagnosis not present

## 2019-01-21 DIAGNOSIS — Z79899 Other long term (current) drug therapy: Secondary | ICD-10-CM | POA: Diagnosis not present

## 2019-01-21 DIAGNOSIS — Z7951 Long term (current) use of inhaled steroids: Secondary | ICD-10-CM | POA: Insufficient documentation

## 2019-01-21 DIAGNOSIS — Z7982 Long term (current) use of aspirin: Secondary | ICD-10-CM | POA: Insufficient documentation

## 2019-01-21 DIAGNOSIS — I10 Essential (primary) hypertension: Secondary | ICD-10-CM | POA: Diagnosis not present

## 2019-01-21 DIAGNOSIS — M94262 Chondromalacia, left knee: Secondary | ICD-10-CM | POA: Insufficient documentation

## 2019-01-21 DIAGNOSIS — X58XXXA Exposure to other specified factors, initial encounter: Secondary | ICD-10-CM | POA: Insufficient documentation

## 2019-01-21 DIAGNOSIS — S83242A Other tear of medial meniscus, current injury, left knee, initial encounter: Secondary | ICD-10-CM | POA: Diagnosis not present

## 2019-01-21 DIAGNOSIS — Z01818 Encounter for other preprocedural examination: Secondary | ICD-10-CM

## 2019-01-21 HISTORY — DX: Malignant (primary) neoplasm, unspecified: C80.1

## 2019-01-21 HISTORY — PX: KNEE ARTHROSCOPY WITH MEDIAL MENISECTOMY: SHX5651

## 2019-01-21 LAB — CBC WITH DIFFERENTIAL/PLATELET
Abs Immature Granulocytes: 0.06 10*3/uL (ref 0.00–0.07)
Basophils Absolute: 0.1 10*3/uL (ref 0.0–0.1)
Basophils Relative: 1 %
Eosinophils Absolute: 0.4 10*3/uL (ref 0.0–0.5)
Eosinophils Relative: 4 %
HCT: 46 % (ref 39.0–52.0)
Hemoglobin: 14.6 g/dL (ref 13.0–17.0)
Immature Granulocytes: 1 %
Lymphocytes Relative: 24 %
Lymphs Abs: 2.6 10*3/uL (ref 0.7–4.0)
MCH: 28 pg (ref 26.0–34.0)
MCHC: 31.7 g/dL (ref 30.0–36.0)
MCV: 88.1 fL (ref 80.0–100.0)
Monocytes Absolute: 1.1 10*3/uL — ABNORMAL HIGH (ref 0.1–1.0)
Monocytes Relative: 11 %
Neutro Abs: 6.5 10*3/uL (ref 1.7–7.7)
Neutrophils Relative %: 59 %
Platelets: 325 10*3/uL (ref 150–400)
RBC: 5.22 MIL/uL (ref 4.22–5.81)
RDW: 13.5 % (ref 11.5–15.5)
WBC: 10.8 10*3/uL — ABNORMAL HIGH (ref 4.0–10.5)
nRBC: 0 % (ref 0.0–0.2)

## 2019-01-21 LAB — COMPREHENSIVE METABOLIC PANEL
ALT: 35 U/L (ref 0–44)
AST: 28 U/L (ref 15–41)
Albumin: 4.6 g/dL (ref 3.5–5.0)
Alkaline Phosphatase: 65 U/L (ref 38–126)
Anion gap: 8 (ref 5–15)
BUN: 17 mg/dL (ref 6–20)
CO2: 21 mmol/L — ABNORMAL LOW (ref 22–32)
Calcium: 8.8 mg/dL — ABNORMAL LOW (ref 8.9–10.3)
Chloride: 108 mmol/L (ref 98–111)
Creatinine, Ser: 0.82 mg/dL (ref 0.61–1.24)
GFR calc Af Amer: >60 mL/min (ref >60)
GFR calc non Af Amer: >60 mL/min (ref >60)
Glucose, Bld: 126 mg/dL — ABNORMAL HIGH (ref 70–99)
Potassium: 4.2 mmol/L (ref 3.5–5.1)
Sodium: 137 mmol/L (ref 135–145)
Total Bilirubin: 1.2 mg/dL (ref 0.3–1.2)
Total Protein: 8.5 g/dL — ABNORMAL HIGH (ref 6.5–8.1)

## 2019-01-21 LAB — PROTIME-INR
INR: 0.9 (ref 0.8–1.2)
Prothrombin Time: 12.1 seconds (ref 11.4–15.2)

## 2019-01-21 SURGERY — ARTHROSCOPY, KNEE, WITH MEDIAL MENISCECTOMY
Anesthesia: General | Laterality: Left

## 2019-01-21 MED ORDER — PROPOFOL 10 MG/ML IV BOLUS
INTRAVENOUS | Status: DC | PRN
  Administered 2019-01-21: 200 mg via INTRAVENOUS

## 2019-01-21 MED ORDER — CHLORHEXIDINE GLUCONATE 4 % EX LIQD: 60.0000 mL | Freq: Once | CUTANEOUS | Status: DC

## 2019-01-21 MED ORDER — HYDROMORPHONE HCL 1 MG/ML IJ SOLN
INTRAMUSCULAR | Status: AC
  Administered 2019-01-21: 0.5 mg via INTRAVENOUS
  Filled 2019-01-21: qty 1

## 2019-01-21 MED ORDER — MIDAZOLAM HCL 2 MG/2ML IJ SOLN
INTRAMUSCULAR | Status: AC
  Filled 2019-01-21: qty 2

## 2019-01-21 MED ORDER — BACITRACIN ZINC 500 UNIT/GM EX OINT
TOPICAL_OINTMENT | CUTANEOUS | Status: AC
  Filled 2019-01-21: qty 28.35

## 2019-01-21 MED ORDER — MEPERIDINE HCL 50 MG/ML IJ SOLN: 6.2500 mg | INTRAMUSCULAR | Status: DC | PRN

## 2019-01-21 MED ORDER — HYDROMORPHONE HCL 1 MG/ML IJ SOLN
INTRAMUSCULAR | Status: AC
  Filled 2019-01-21: qty 1

## 2019-01-21 MED ORDER — HYDROCODONE-ACETAMINOPHEN 5-325 MG PO TABS: 1.0000 | ORAL_TABLET | Freq: Four times a day (QID) | ORAL | 0 refills | Status: AC | PRN

## 2019-01-21 MED ORDER — PROPOFOL 10 MG/ML IV BOLUS
INTRAVENOUS | Status: AC
  Filled 2019-01-21: qty 20

## 2019-01-21 MED ORDER — MIDAZOLAM HCL 5 MG/5ML IJ SOLN
INTRAMUSCULAR | Status: DC | PRN
  Administered 2019-01-21: 2 mg via INTRAVENOUS

## 2019-01-21 MED ORDER — OXYCODONE HCL 5 MG/5ML PO SOLN: 5.0000 mg | Freq: Once | ORAL | Status: AC | PRN

## 2019-01-21 MED ORDER — FENTANYL CITRATE (PF) 100 MCG/2ML IJ SOLN
INTRAMUSCULAR | Status: DC | PRN
  Administered 2019-01-21 (×3): 50 ug via INTRAVENOUS

## 2019-01-21 MED ORDER — OXYCODONE HCL 5 MG PO TABS
ORAL_TABLET | ORAL | Status: AC
  Filled 2019-01-21: qty 1

## 2019-01-21 MED ORDER — PROMETHAZINE HCL 25 MG/ML IJ SOLN: 6.2500 mg | INTRAMUSCULAR | Status: DC | PRN

## 2019-01-21 MED ORDER — LIDOCAINE 2% (20 MG/ML) 5 ML SYRINGE
INTRAMUSCULAR | Status: DC | PRN
  Administered 2019-01-21: 80 mg via INTRAVENOUS

## 2019-01-21 MED ORDER — LACTATED RINGERS IV SOLN
INTRAVENOUS | Status: DC
  Administered 2019-01-21: 10:00:00 via INTRAVENOUS

## 2019-01-21 MED ORDER — BUPIVACAINE-EPINEPHRINE (PF) 0.25% -1:200000 IJ SOLN
INTRAMUSCULAR | Status: AC
  Filled 2019-01-21: qty 30

## 2019-01-21 MED ORDER — LACTATED RINGERS IR SOLN
Status: DC | PRN
  Administered 2019-01-21 (×2): 6000 mL

## 2019-01-21 MED ORDER — VANCOMYCIN HCL 10 G IV SOLR
1500.0000 mg | INTRAVENOUS | Status: AC
  Administered 2019-01-21: 1500 mg via INTRAVENOUS
  Filled 2019-01-21: qty 1500

## 2019-01-21 MED ORDER — FENTANYL CITRATE (PF) 100 MCG/2ML IJ SOLN
INTRAMUSCULAR | Status: AC
  Filled 2019-01-21: qty 2

## 2019-01-21 MED ORDER — LIDOCAINE 2% (20 MG/ML) 5 ML SYRINGE
INTRAMUSCULAR | Status: AC
  Filled 2019-01-21: qty 5

## 2019-01-21 MED ORDER — OXYCODONE HCL 5 MG PO TABS
5.0000 mg | ORAL_TABLET | Freq: Once | ORAL | Status: AC | PRN
  Administered 2019-01-21: 5 mg via ORAL

## 2019-01-21 MED ORDER — ASPIRIN EC 325 MG PO TBEC: 325.0000 mg | DELAYED_RELEASE_TABLET | Freq: Two times a day (BID) | ORAL | 0 refills | Status: DC

## 2019-01-21 MED ORDER — HYDROMORPHONE HCL 1 MG/ML IJ SOLN
0.2500 mg | INTRAMUSCULAR | Status: DC | PRN
  Administered 2019-01-21 (×2): 0.5 mg via INTRAVENOUS

## 2019-01-21 MED ORDER — METHOCARBAMOL 500 MG PO TABS: 500.0000 mg | ORAL_TABLET | Freq: Four times a day (QID) | ORAL | 0 refills | Status: DC

## 2019-01-21 MED ORDER — BUPIVACAINE-EPINEPHRINE 0.25% -1:200000 IJ SOLN
INTRAMUSCULAR | Status: DC | PRN
  Administered 2019-01-21: 20 mL

## 2019-01-21 MED ORDER — METHOCARBAMOL 500 MG IVPB - SIMPLE MED
INTRAVENOUS | Status: AC
  Administered 2019-01-21: 500 mg
  Filled 2019-01-21: qty 50

## 2019-01-21 SURGICAL SUPPLY — 34 items
APL PRP STRL LF DISP 70% ISPRP (MISCELLANEOUS) ×1
BANDAGE ACE 4X5 VEL STRL LF (GAUZE/BANDAGES/DRESSINGS) ×6 IMPLANT
BANDAGE ACE 6X5 VEL STRL LF (GAUZE/BANDAGES/DRESSINGS) ×2 IMPLANT
BLADE SURG SZ11 CARB STEEL (BLADE) IMPLANT
BNDG COHESIVE 6X5 TAN STRL LF (GAUZE/BANDAGES/DRESSINGS) ×2 IMPLANT
BNDG GAUZE ELAST 4 BULKY (GAUZE/BANDAGES/DRESSINGS) ×2 IMPLANT
CHLORAPREP W/TINT 26 (MISCELLANEOUS) ×2 IMPLANT
CLOTH BEACON ORANGE TIMEOUT ST (SAFETY) ×2 IMPLANT
COVER SURGICAL LIGHT HANDLE (MISCELLANEOUS) ×2 IMPLANT
COVER WAND RF STERILE (DRAPES) IMPLANT
DRAPE ARTHROSCOPY W/POUCH 114 (DRAPES) ×2 IMPLANT
DRAPE SHEET LG 3/4 BI-LAMINATE (DRAPES) ×2 IMPLANT
DRSG ADAPTIC 3X8 NADH LF (GAUZE/BANDAGES/DRESSINGS) ×2 IMPLANT
DRSG PAD ABDOMINAL 8X10 ST (GAUZE/BANDAGES/DRESSINGS) ×6 IMPLANT
ELECT REM PT RETURN 15FT ADLT (MISCELLANEOUS) ×2 IMPLANT
GAUZE SPONGE 4X4 12PLY STRL (GAUZE/BANDAGES/DRESSINGS) ×2 IMPLANT
GAUZE SPONGE 4X4 16PLY XRAY LF (GAUZE/BANDAGES/DRESSINGS) ×2 IMPLANT
GLOVE BIOGEL PI IND STRL 8.5 (GLOVE) ×1 IMPLANT
GLOVE BIOGEL PI INDICATOR 8.5 (GLOVE) ×1
GLOVE ECLIPSE 8.0 STRL XLNG CF (GLOVE) ×2 IMPLANT
GOWN STRL REUS W/TWL XL LVL3 (GOWN DISPOSABLE) ×2 IMPLANT
KIT TURNOVER KIT A (KITS) IMPLANT
MANIFOLD NEPTUNE II (INSTRUMENTS) ×2 IMPLANT
PACK ARTHROSCOPY WL (CUSTOM PROCEDURE TRAY) ×2 IMPLANT
PACK ICE MAXI GEL EZY WRAP (MISCELLANEOUS) ×2 IMPLANT
PAD MASON LEG HOLDER (PIN) ×2 IMPLANT
PENCIL ELECTRO RS 15 CORD (MISCELLANEOUS) IMPLANT
PORT APPOLLO RF 90DEGREE MULTI (SURGICAL WAND) ×2 IMPLANT
SUT ETHILON 3 0 PS 1 (SUTURE) ×2 IMPLANT
TOWEL OR 17X26 10 PK STRL BLUE (TOWEL DISPOSABLE) ×2 IMPLANT
TUBING ARTHRO INFLOW-ONLY STRL (TUBING) ×2 IMPLANT
TUBING ARTHROSCOPY IRRIG 16FT (MISCELLANEOUS) ×2 IMPLANT
TUBING CONNECTING 10 (TUBING) ×2 IMPLANT
WRAP KNEE MAXI GEL POST OP (GAUZE/BANDAGES/DRESSINGS) ×2 IMPLANT

## 2019-01-21 NOTE — Op Note (Signed)
NAME: Micheal Dominguez, RADNEY MEDICAL RECORD [id number] ACCOUNT [id number] DATE OF BIRTH:September 26, 1966 FACILITY: [facility] LOCATION: [facility] PHYSICIAN:Hendrix Console Fransico Setters, MD  OPERATIVE REPORT  DATE OF PROCEDURE:  01/21/2019  SURGEON:  [name], MD  ASSISTANT:  OR nurse.  PREOPERATIVE DIAGNOSES: 1.  Severe chondromalacia of the left knee. 2.  Tear of the posterior horn of the medial meniscus, left knee.  POSTOPERATIVE DIAGNOSES:  1.  Severe chondromalacia of the left knee. 2.  Tear of the posterior horn of the medial meniscus, left knee.  PROCEDURES:   1.  Diagnostic arthroscopy of the left knee. 2.  Partial medial meniscectomy of the posterior horn of the medial meniscus, left knee. 3.  Abrasion chondroplasty of the medial femoral condyle. 4.  Microfracture of the medial femoral condyle.  ANESTHESIA:  General.  DESCRIPTION OF PROCEDURE:  Under general anesthesia, routine orthopedic prep and draping of the left lower extremity was carried out.  At this time, the appropriate timeout was carried out.  I also marked the appropriate left leg in the holding area.   The left leg was placed in a knee holder.  Sterile prepping and draping was carried out.  He had 1500 mg of vancomycin IV since he was allergic to PENICILLIN.  Then operation was medial meniscectomy as I mentioned and abrasion chondroplasty and  microfracture medial femoral condyle.  Under general anesthesia, routine orthopedic prep and draping of the left lower extremity was carried out.  The left leg was in a knee holder.  A small punctate incision made in the suprapatellar pouch.  Inflow  cannula was inserted.  Knee was distended with saline.  Another small punctate incision was made in the lateral joint.  The arthroscope was entered from a lateral approach.  I then made a small incision medially and inserted the Arthrocare.  He had  significant chondromalacia of the medial femoral condyle.  A photograph was taken.  I did an  abrasion chondroplasty, not down to bleeding bone.  I then utilized a microfracture technique.  Once this was done, I went back posteriorly and identified the  tear of the posterior horn of the medial meniscus did a partial medial meniscectomy.  I tried to preserve as much of the meniscus as possible at this time.  There were no other pathologic lesions noted.  I thoroughly irrigated out the area we removed the  fluid, closed all 3 punctate incisions with 3-0 nylon suture.  I injected 20 mL of 0.25% Marcaine with epinephrine in the knee joint.  Sterile Neosporin dressing was applied    SPECIMENS:  None.  ESTIMATED BLOOD LOSS:  The blood loss was about 15 mL.    He will be on aspirin 325 mg 1 twice a day.  He will be on the aspirin for 2 weeks twice a day, hydrocodone for pain 5/325 one every 4 hours p.r.n. for pain.  He will be on crutches, partial weightbearing.  See me in the office in about a week and a half  or so for suture removal or prior to if there is a problem.  AN/NUANCE  D:01/21/2019 T:01/21/2019 JOB:[id number]

## 2019-01-21 NOTE — Anesthesia Postprocedure Evaluation (Signed)
Anesthesia Post Note  Patient: Micheal Dominguez  Procedure(s) Performed: Left knee arthroscopy with medial menisectomy, debridement, chondroplasty (Left )     Patient location during evaluation: PACU Anesthesia Type: General Level of consciousness: awake and alert Pain management: pain level controlled Vital Signs Assessment: post-procedure vital signs reviewed and stable Respiratory status: spontaneous breathing, nonlabored ventilation and respiratory function stable Cardiovascular status: blood pressure returned to baseline and stable Postop Assessment: no apparent nausea or vomiting Anesthetic complications: no    Last Vitals:  Vitals:   01/21/19 1315 01/21/19 1330  BP: (!) 162/91 (!) 181/96  Pulse: 83 82  Resp: 16 17  Temp: 36.8 C 36.8 C  SpO2: 97% 98%    Last Pain:  Vitals:   01/21/19 1330  TempSrc:   PainSc: [place]

## 2019-01-21 NOTE — Discharge Instructions (Addendum)
Dr. [name] [facility] [address], [place], [place] [address] [phone]   Arthroscopic Procedure, Knee An arthroscopic procedure can find what is wrong with your knee. PROCEDURE Arthroscopy is a surgical technique that allows your orthopedic surgeon to diagnose and treat your knee injury with accuracy. They will look into your knee through a small instrument. This is almost like a small (pencil sized) telescope. Because arthroscopy affects your knee less than open knee surgery, you can anticipate a more rapid recovery. Taking an active role by following your caregiver's instructions will help with rapid and complete recovery. Use crutches, rest, elevation, ice, and knee exercises as instructed. The length of recovery depends on various factors including type of injury, age, physical condition, medical conditions, and your rehabilitation. Your knee is the joint between the large bones (femur and tibia) in your leg. Cartilage covers these bone ends which are smooth and slippery and allow your knee to bend and move smoothly. Two menisci, thick, semi-lunar shaped pads of cartilage which form a rim inside the joint, help absorb shock and stabilize your knee. Ligaments bind the bones together and support your knee joint. Muscles move the joint, help support your knee, and take stress off the joint itself. Because of this all programs and physical therapy to rehabilitate an injured or repaired knee require rebuilding and strengthening your muscles. AFTER THE PROCEDURE  After the procedure, you will be moved to a recovery area until most of the effects of the medication have worn off. Your caregiver will discuss the test results with you.   Only take over-the-counter or prescription medicines for pain, discomfort, or fever as directed by your caregiver.  SEEK MEDICAL CARE IF:   You have increased bleeding from your wounds.   You see redness, swelling, or have increasing  pain in your wounds.   You have pus coming from your wound.   You have an oral temperature above 102 F (38.9 C).   You notice a bad smell coming from the wound or dressing.   You have severe pain with any motion of your knee.  SEEK IMMEDIATE MEDICAL CARE IF:   You develop a rash.   You have difficulty breathing.   You have any allergic problems.  FURTHER INSTRUCTIONS:   ICE to the affected knee every three hours for 30 minutes at a time and then as needed for pain and swelling.  Continue to use ice on the knee for pain and swelling from surgery. You may notice swelling that will progress down to the foot and ankle.  This is normal after surgery.  Elevate the leg when you are not up walking on it.    DIET You may resume your previous home diet once your are discharged from the hospital.  DRESSING / WOUND CARE / SHOWERING You may shower 3 days after surgery, but keep the wounds dry during showering.  You may use an occlusive plastic wrap (Press'n Seal for example), NO SOAKING/SUBMERGING IN THE BATHTUB.  If the bandage gets wet, change with a clean dry gauze.  If the incision gets wet, pat the wound dry with a clean towel. You may start showering two days after being discharged home but do not submerge the incisions under water.  Change dressing 48 hours after the procedure and then cover the small incisions with band aids until your follow up visit. Change the surgical dressings daily and reapply a dry dressing each time.   ACTIVITY Walk with your  [place] as instructed. Use Clarida as long as suggested by your caregivers. Avoid periods of inactivity such as sitting longer than an hour when not asleep. This helps prevent blood clots.  You may return to work once you are cleared by your doctor.   Do not drive while taking narcotics.  WEIGHT BEARING Weight bearing as tolerated with assist device (Taitano, cane, etc) as directed, use it as long as suggested by your surgeon or therapist,  typically at least 4-6 weeks.  POSTOPERATIVE CONSTIPATION PROTOCOL Constipation - defined medically as fewer than three stools per week and severe constipation as less than one stool per week.  One of the most common issues patients have following surgery is constipation.  Even if you have a regular bowel pattern at home, your normal regimen is likely to be disrupted due to multiple reasons following surgery.  Combination of anesthesia, postoperative narcotics, change in appetite and fluid intake all can affect your bowels.  In order to avoid complications following surgery, here are some recommendations in order to help you during your recovery period.  Colace (docusate) - Pick up an over-the-counter form of Colace or another stool softener and take twice a day as long as you are requiring postoperative pain medications.  Take with a full glass of water daily.  If you experience loose stools or diarrhea, hold the colace until you stool forms back up.  If your symptoms do not get better within 1 week or if they get worse, check with your doctor.  Dulcolax (bisacodyl) - Pick up over-the-counter and take as directed by the product packaging as needed to assist with the movement of your bowels.  Take with a full glass of water.  Use this product as needed if not relieved by Colace only.   MiraLax (polyethylene glycol) - Pick up over-the-counter to have on hand.  MiraLax is a solution that will increase the amount of water in your bowels to assist with bowel movements.  Take as directed and can mix with a glass of water, juice, soda, coffee, or tea.  Take if you go more than two days without a movement. Do not use MiraLax more than once per day. Call your doctor if you are still constipated or irregular after using this medication for 7 days in a row.  If you continue to have problems with postoperative constipation, please contact the office for further assistance and recommendations.  If you experience  "the worst abdominal pain ever" or develop nausea or vomiting, please contact the office immediatly for further recommendations for treatment.  ITCHING  If you experience itching with your medications, try taking only a single pain pill, or even half a pain pill at a time.  You can also use Benadryl over the counter for itching or also to help with sleep.   TED HOSE STOCKINGS Wear the elastic stockings on both legs for three weeks following surgery during the day but you may remove then at night for sleeping.  MEDICATIONS See your medication summary on the After Visit Summary that the nursing staff will review with you prior to discharge.  You may have some home medications which will be placed on hold until you complete the course of blood thinner medication.  It is important for you to complete the blood thinner medication as prescribed by your surgeon.  Continue your approved medications as instructed at time of discharge. Do not drive while taking narcotics.   PRECAUTIONS If you experience chest pain or shortness  of breath - call 911 immediately for transfer to the hospital emergency department.  If you develop a fever greater that 101 F, purulent drainage from wound, increased redness or drainage from wound, foul odor from the wound/dressing, or calf pain - CONTACT YOUR SURGEON.                                                   FOLLOW-UP APPOINTMENTS Make sure you keep all of your appointments after your operation with your surgeon and caregivers. You should call the office at [phone]  and make an appointment for approximately one week after the date of your surgery or on the date instructed by your surgeon outlined in the "After Visit Summary".  RANGE OF MOTION AND STRENGTHENING EXERCISES  Rehabilitation of the knee is important following a knee injury or an operation. After just a few days of immobilization, the muscles of the thigh which control the knee become weakened and  shrink (atrophy). Knee exercises are designed to build up the tone and strength of the thigh muscles and to improve knee motion. Often times heat used for twenty to thirty minutes before working out will loosen up your tissues and help with improving the range of motion but do not use heat for the first two weeks following surgery. These exercises can be done on a training (exercise) mat, on the floor, on a table or on a bed. Use what ever works the best and is most comfortable for you Knee exercises include:  QUAD STRENGTHENING EXERCISES Strengthening Quadriceps Sets  Tighten muscles on top of thigh by pushing knees down into floor or table. Hold for 20 seconds. Repeat 10 times. Do 2 sessions per day.     Strengthening Terminal Knee Extension  With knee bent over bolster, straighten knee by tightening muscle on top of thigh. Be sure to keep bottom of knee on bolster. Hold for 20 seconds. Repeat 10 times. Do 2 sessions per day.   Straight Leg with Bent Knee  Lie on back with opposite leg bent. Keep involved knee slightly bent at knee and raise leg 4-6". Hold for 10 seconds. Repeat 20 times per set. Do 2 sets per session. Do 2 sessions per day.   General Anesthesia, Adult, Care After This sheet gives you information about how to care for yourself after your procedure. Your health care provider may also give you more specific instructions. If you have problems or questions, contact your health care provider. What can I expect after the procedure? After the procedure, the following side effects are common:  Pain or discomfort at the IV site.  Nausea.  Vomiting.  Sore throat.  Trouble concentrating.  Feeling cold or chills.  Weak or tired.  Sleepiness and fatigue.  Soreness and body aches. These side effects can affect parts of the body that were not involved in surgery. Follow these instructions at home:  For at least 24 hours after the procedure:  Have a responsible  adult stay with you. It is important to have someone help care for you until you are awake and alert.  Rest as needed.  Do not: ? Participate in activities in which you could fall or become injured. ? Drive. ? Use heavy machinery. ? Drink alcohol. ? Take sleeping pills or medicines that cause drowsiness. ? Make important decisions or sign  legal documents. ? Take care of children on your own. Eating and drinking  Follow any instructions from your health care provider about eating or drinking restrictions.  When you feel hungry, start by eating small amounts of foods that are soft and easy to digest (bland), such as toast. Gradually return to your regular diet.  Drink enough fluid to keep your urine pale yellow.  If you vomit, rehydrate by drinking water, juice, or clear broth. General instructions  If you have sleep apnea, surgery and certain medicines can increase your risk for breathing problems. Follow instructions from your health care provider about wearing your sleep device: ? Anytime you are sleeping, including during daytime naps. ? While taking prescription pain medicines, sleeping medicines, or medicines that make you drowsy.  Return to your normal activities as told by your health care provider. Ask your health care provider what activities are safe for you.  Take over-the-counter and prescription medicines only as told by your health care provider.  If you smoke, do not smoke without supervision.  Keep all follow-up visits as told by your health care provider. This is important. Contact a health care provider if:  You have nausea or vomiting that does not get better with medicine.  You cannot eat or drink without vomiting.  You have pain that does not get better with medicine.  You are unable to pass urine.  You develop a skin rash.  You have a fever.  You have redness around your IV site that gets worse. Get help right away if:  You have difficulty  breathing.  You have chest pain.  You have blood in your urine or stool, or you vomit blood. Summary  After the procedure, it is common to have a sore throat or nausea. It is also common to feel tired.  Have a responsible adult stay with you for the first 24 hours after general anesthesia. It is important to have someone help care for you until you are awake and alert.  When you feel hungry, start by eating small amounts of foods that are soft and easy to digest (bland), such as toast. Gradually return to your regular diet.  Drink enough fluid to keep your urine pale yellow.  Return to your normal activities as told by your health care provider. Ask your health care provider what activities are safe for you. This information is not intended to replace advice given to you by your health care provider. Make sure you discuss any questions you have with your health care provider. Document Released: 01/28/2001 Document Revised: 06/07/2017 Document Reviewed: 06/07/2017 Elsevier Interactive Patient Education  2019 [employer].

## 2019-01-21 NOTE — H&P (Signed)
Micheal Dominguez is an 53 y.o. male.   Chief Complaint: Pain and swelling in Left Knee HPI: Continues to have pain and catching in his Left Knee.  Past Medical History:  Diagnosis Date  . 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 Hypertension   . RBBB    Hx - no problems    Past Surgical History:  Procedure Laterality Date  . COLONOSCOPY  06/08/2016   per Dr. Henrene Pastor, no polyps, has diverticulosis and int. hemorrhoids, repeat in 10 yrs   . KNEE ARTHROSCOPY     right  . MANDIBLE SURGERY  09/25/2018   cancer- done at [facility]    Family History  Problem Relation Age of Onset  . Cancer Father        prostate  . Cancer Brother        prostate  . Heart disease Other   . Diabetes Other   . Hyperlipidemia Other   . Hypertension Other   . Cancer Other        lung,prostate  . Stroke Other   . Colon cancer Neg Hx   . Esophageal cancer Neg Hx   . Rectal cancer Neg Hx   . Stomach cancer Neg Hx    Social History:  reports that he has never smoked. He quit smokeless tobacco use about 5 months ago.  His smokeless tobacco use included chew. He reports current alcohol use of about 28.0 standard drinks of alcohol per week. He reports that he does not use drugs.  Allergies:  Allergies  Allergen Reactions  . Contrast Media [Iodinated Diagnostic Agents] Other (See Comments) and Cough    Uncontrollable gagging/coughing  . Lisinopril Cough  . Penicillins Other (See Comments)    Did it involve swelling of the face/tongue/throat, SOB, or low BP? Unknown Did it involve sudden or severe rash/hives, skin peeling, or any reaction on the inside of your mouth or nose? Unknown Did you need to seek medical attention at a hospital or doctor's office? Yes When did it last happen?Infantile Reaction If all above answers are "NO", may proceed with cephalosporin use. Childhood reaction    Facility-Administered Medications Prior to Admission  Medication Dose Route Frequency Provider Last Rate  Last Dose  . 0.9 %  sodium chloride infusion  500 mL Intravenous Continuous Irene Shipper, MD       Medications Prior to Admission  Medication Sig Dispense Refill  . amLODipine (NORVASC) 10 MG tablet Take 1 tablet (10 mg total) by mouth daily. 90 tablet 3  . aspirin EC 81 MG tablet Take 81 mg by mouth daily.    . Cyanocobalamin (VITAMIN B-12 PO) Take 1 tablet by mouth daily.    . metoprolol succinate (TOPROL-XL) 50 MG 24 hr tablet Take 1 tablet (50 mg total) by mouth daily. Take with or immediately following a meal. (Patient taking differently: Take 50 mg by mouth at bedtime. Take with or immediately following a meal.) 90 tablet 3  . SUPER B COMPLEX/C PO Take 1 capsule by mouth daily.    Marland Kitchen albuterol (PROVENTIL HFA;VENTOLIN HFA) 108 (90 Base) MCG/ACT inhaler Inhale 2 puffs into the lungs every 4 (four) hours as needed for wheezing or shortness of breath. 1 Inhaler 5  . Ascorbic Acid (VITAMIN C PO) Take 1 tablet by mouth 3 (three) times a week.    . fluticasone (FLONASE) 50 MCG/ACT nasal spray Place 2 sprays into both nostrils daily. (Patient taking differently: Place 2 sprays into both  nostrils daily as needed (allergies.). ) 48 g 3  . GARLIC PO Take 1 tablet by mouth daily.    Marland Kitchen KRILL OIL PO Take 1 capsule by mouth daily.    Marland Kitchen MAGNESIUM PO Take 1 tablet by mouth daily.    Marland Kitchen triamcinolone cream (KENALOG) 0.1 % Apply 1 application topically 2 (two) times daily. (Patient taking differently: Apply 1 application topically 2 (two) times daily as needed (skin rash/irritation.). ) 45 g 2    Results for orders placed or performed during the hospital encounter of 01/21/19 (from the past 48 hour(s))  CBC WITH DIFFERENTIAL     Status: Abnormal   Collection Time: 01/21/19 10:03 AM  Result Value Ref Range   WBC 10.8 (H) 4.0 - 10.5 K/uL   RBC 5.22 4.22 - 5.81 MIL/uL   Hemoglobin 14.6 13.0 - 17.0 g/dL   HCT 46.0 39.0 - 52.0 %   MCV 88.1 80.0 - 100.0 fL   MCH 28.0 26.0 - 34.0 pg   MCHC 31.7 30.0 - 36.0  g/dL   RDW 13.5 11.5 - 15.5 %   Platelets 325 150 - 400 K/uL   nRBC 0.0 0.0 - 0.2 %   Neutrophils Relative % 59 %   Neutro Abs 6.5 1.7 - 7.7 K/uL   Lymphocytes Relative 24 %   Lymphs Abs 2.6 0.7 - 4.0 K/uL   Monocytes Relative 11 %   Monocytes Absolute 1.1 (H) 0.1 - 1.0 K/uL   Eosinophils Relative 4 %   Eosinophils Absolute 0.4 0.0 - 0.5 K/uL   Basophils Relative 1 %   Basophils Absolute 0.1 0.0 - 0.1 K/uL   Immature Granulocytes 1 %   Abs Immature Granulocytes 0.06 0.00 - 0.07 K/uL    Comment: Performed at [facility], [place] [address] [place], [place] [address]  Comprehensive metabolic panel     Status: Abnormal   Collection Time: 01/21/19 10:03 AM  Result Value Ref Range   Sodium 137 135 - 145 mmol/L   Potassium 4.2 3.5 - 5.1 mmol/L   Chloride 108 98 - 111 mmol/L   CO2 21 (L) 22 - 32 mmol/L   Glucose, Bld 126 (H) 70 - 99 mg/dL   BUN 17 6 - 20 mg/dL   Creatinine, Ser 0.82 0.61 - 1.24 mg/dL   Calcium 8.8 (L) 8.9 - 10.3 mg/dL   Total Protein 8.5 (H) 6.5 - 8.1 g/dL   Albumin 4.6 3.5 - 5.0 g/dL   AST 28 15 - 41 U/L   ALT 35 0 - 44 U/L   Alkaline Phosphatase 65 38 - 126 U/L   Total Bilirubin 1.2 0.3 - 1.2 mg/dL   GFR calc non Af Amer >60 >60 mL/min   GFR calc Af Amer >60 >60 mL/min   Anion gap 8 5 - 15    Comment: Performed at [facility], [place] [address] [place], [place] [address]  Protime-INR     Status: None   Collection Time: 01/21/19 10:03 AM  Result Value Ref Range   Prothrombin Time 12.1 11.4 - 15.2 seconds   INR 0.9 0.8 - 1.2    Comment: (NOTE) INR goal varies based on device and disease states. Performed at [facility], [place] [address], [place], [place] [address]    Dg Chest 2 View  Result Date: 01/21/2019 CLINICAL DATA:  Preop evaluation for upcoming knee surgery EXAM: CHEST - 2 VIEW COMPARISON:  None. FINDINGS: The heart size and mediastinal contours are  within normal limits. Both lungs are clear. The  visualized skeletal structures are unremarkable. IMPRESSION: No active cardiopulmonary disease. Electronically Signed   By: Inez Catalina M.D.   On: 01/21/2019 10:16    Review of Systems  Constitutional: Negative.   HENT: Negative.   Eyes: Negative.   Respiratory: Negative.   Cardiovascular: Negative.   Gastrointestinal: Negative.   Genitourinary: Negative.   Musculoskeletal: Positive for joint pain.  Skin: Negative.   Neurological: Negative.   Endo/Heme/Allergies: Negative.   Psychiatric/Behavioral: Negative.     Blood pressure (!) 157/90, pulse 78, temperature 98.1 F (36.7 C), temperature source Oral, resp. rate 18, height 5\' 7"  (1.702 m), weight 112 kg, SpO2 100 %. Physical Exam  Constitutional: He appears well-developed.  HENT:  Head: Normocephalic.  Eyes: Pupils are equal, round, and reactive to light.  Neck: Normal range of motion.  Cardiovascular: Normal rate.  Respiratory: Effort normal.  GI: Soft.  Musculoskeletal:     Comments: Pain and swelling in his Left Knee.     Assessment/Plan Arthroscopic Medial Meniscectomy of his Left Knee.  [name], MD 01/21/2019, 10:39 AM

## 2019-01-21 NOTE — Anesthesia Preprocedure Evaluation (Signed)
Anesthesia Evaluation  Patient identified by MRN, date of birth, ID band Patient awake    Reviewed: Allergy & Precautions, NPO status , Patient's Chart, lab work & pertinent test results  Airway Mallampati: II  TM Distance: >3 FB Neck ROM: Full    Dental no notable dental hx.    Pulmonary neg pulmonary ROS,    Pulmonary exam normal breath sounds clear to auscultation       Cardiovascular hypertension, Pt. on medications negative cardio ROS Normal cardiovascular exam Rhythm:Regular Rate:Normal     Neuro/Psych negative neurological ROS  negative psych ROS   GI/Hepatic negative GI ROS, Neg liver ROS,   Endo/Other  negative endocrine ROS  Renal/GU negative Renal ROS  negative genitourinary   Musculoskeletal negative musculoskeletal ROS (+)   Abdominal (+) + obese,   Peds negative pediatric ROS (+)  Hematology negative hematology ROS (+)   Anesthesia Other Findings   Reproductive/Obstetrics negative OB ROS                             Anesthesia Physical Anesthesia Plan  ASA: II  Anesthesia Plan: General   Post-op Pain Management:    Induction: Intravenous  PONV Risk Score and Plan: 2 and Ondansetron and Midazolam  Airway Management Planned: LMA  Additional Equipment:   Intra-op Plan:   Post-operative Plan: Extubation in OR  Informed Consent: I have reviewed the patients History and Physical, chart, labs and discussed the procedure including the risks, benefits and alternatives for the proposed anesthesia with the patient or authorized representative who has indicated his/her understanding and acceptance.     Dental advisory given  Plan Discussed with: CRNA  Anesthesia Plan Comments:         Anesthesia Quick Evaluation

## 2019-01-21 NOTE — Anesthesia Procedure Notes (Signed)
Procedure Name: LMA Insertion Date/Time: 01/21/2019 10:59 AM Performed by: [place] Jayvion Stefanski C, CRNA Pre-anesthesia Checklist: Patient identified, Emergency Drugs available, Suction available and Patient being monitored Patient Re-evaluated:Patient Re-evaluated prior to induction Oxygen Delivery Method: Circle system utilized Preoxygenation: Pre-oxygenation with 100% oxygen Induction Type: IV induction Ventilation: Mask ventilation without difficulty LMA: LMA inserted LMA Size: 4.0 Number of attempts: 1 Airway Equipment and Method: Bite block Placement Confirmation: positive ETCO2 Tube secured with: Tape Dental Injury: Teeth and Oropharynx as per pre-operative assessment

## 2019-01-21 NOTE — Brief Op Note (Signed)
01/21/2019  12:07 PM  PATIENT:  Micheal Dominguez  53 y.o. male  PRE-OPERATIVE DIAGNOSIS:  left knee medial meniscus tear and Chondromalicia of the Medial Femoral Condyle  POST-OPERATIVE DIAGNOSIS:  Same as Pre-Op  PROCEDURE:  Procedure(s) with comments: Left knee arthroscopy with medial menisectomy, debridement, chondroplasty (Left) - 51min  SURGEON:  Surgeon(s) and Role:    [name], MD - Primary    ASSISTANTS:OR Nurse   ANESTHESIA:   general  EBL:  20cc   BLOOD ADMINISTERED:none  DRAINS: none   LOCAL MEDICATIONS USED:  MARCAINE 20ccc of 0.25% with  Epinephrine.    SPECIMEN:  No Specimen  DISPOSITION OF SPECIMEN:  N/A  COUNTS:  YES  TOURNIQUET:  * No tourniquets in log *  DICTATION: .Other Dictation: Dictation Number [id number]  PLAN OF CARE: Discharge to home after PACU  PATIENT DISPOSITION:  PACU - hemodynamically stable.   Delay start of Pharmacological VTE agent (>24hrs) due to surgical blood loss or risk of bleeding: yes

## 2019-01-21 NOTE — Transfer of Care (Signed)
Immediate Anesthesia Transfer of Care Note  Patient: Micheal Dominguez  Procedure(s) Performed: Left knee arthroscopy with medial menisectomy, debridement, chondroplasty (Left )  Patient Location: PACU  Anesthesia Type:General  Level of Consciousness: sedated  Airway & Oxygen Therapy: Patient Spontanous Breathing and Patient connected to face mask oxygen  Post-op Assessment: Report given to RN and Post -op Vital signs reviewed and stable  Post vital signs: Reviewed and stable  Last Vitals:  Vitals Value Taken Time  BP    Temp    Pulse    Resp    SpO2      Last Pain:  Vitals:   01/21/19 1315  TempSrc:   PainSc: 5       Patients Stated Pain Goal: 4 (43/83/81 8403)  Complications: No apparent anesthesia complications

## 2019-01-22 ENCOUNTER — Encounter (HOSPITAL_COMMUNITY): Payer: Self-pay | Admitting: Orthopedic Surgery

## 2019-01-28 DIAGNOSIS — T508X5D Adverse effect of diagnostic agents, subsequent encounter: Secondary | ICD-10-CM | POA: Diagnosis not present

## 2019-01-28 DIAGNOSIS — Z08 Encounter for follow-up examination after completed treatment for malignant neoplasm: Secondary | ICD-10-CM | POA: Diagnosis not present

## 2019-01-28 DIAGNOSIS — C069 Malignant neoplasm of mouth, unspecified: Secondary | ICD-10-CM | POA: Diagnosis not present

## 2019-01-28 DIAGNOSIS — R911 Solitary pulmonary nodule: Secondary | ICD-10-CM | POA: Diagnosis not present

## 2019-01-28 DIAGNOSIS — Z85819 Personal history of malignant neoplasm of unspecified site of lip, oral cavity, and pharynx: Secondary | ICD-10-CM | POA: Diagnosis not present

## 2019-02-03 DIAGNOSIS — Z5189 Encounter for other specified aftercare: Secondary | ICD-10-CM | POA: Diagnosis not present

## 2019-02-03 DIAGNOSIS — M25562 Pain in left knee: Secondary | ICD-10-CM | POA: Diagnosis not present

## 2019-02-03 DIAGNOSIS — M2392 Unspecified internal derangement of left knee: Secondary | ICD-10-CM | POA: Diagnosis not present

## 2019-02-05 DIAGNOSIS — M25562 Pain in left knee: Secondary | ICD-10-CM | POA: Diagnosis not present

## 2019-03-11 DIAGNOSIS — M9901 Segmental and somatic dysfunction of cervical region: Secondary | ICD-10-CM | POA: Diagnosis not present

## 2019-03-11 DIAGNOSIS — M9903 Segmental and somatic dysfunction of lumbar region: Secondary | ICD-10-CM | POA: Diagnosis not present

## 2019-03-11 DIAGNOSIS — M5412 Radiculopathy, cervical region: Secondary | ICD-10-CM | POA: Diagnosis not present

## 2019-03-11 DIAGNOSIS — M9902 Segmental and somatic dysfunction of thoracic region: Secondary | ICD-10-CM | POA: Diagnosis not present

## 2019-03-12 DIAGNOSIS — M1712 Unilateral primary osteoarthritis, left knee: Secondary | ICD-10-CM | POA: Diagnosis not present

## 2019-03-12 DIAGNOSIS — M9902 Segmental and somatic dysfunction of thoracic region: Secondary | ICD-10-CM | POA: Diagnosis not present

## 2019-03-12 DIAGNOSIS — M9901 Segmental and somatic dysfunction of cervical region: Secondary | ICD-10-CM | POA: Diagnosis not present

## 2019-03-12 DIAGNOSIS — M9903 Segmental and somatic dysfunction of lumbar region: Secondary | ICD-10-CM | POA: Diagnosis not present

## 2019-03-12 DIAGNOSIS — M5412 Radiculopathy, cervical region: Secondary | ICD-10-CM | POA: Diagnosis not present

## 2019-03-16 DIAGNOSIS — M5412 Radiculopathy, cervical region: Secondary | ICD-10-CM | POA: Diagnosis not present

## 2019-03-16 DIAGNOSIS — M9901 Segmental and somatic dysfunction of cervical region: Secondary | ICD-10-CM | POA: Diagnosis not present

## 2019-03-16 DIAGNOSIS — M9903 Segmental and somatic dysfunction of lumbar region: Secondary | ICD-10-CM | POA: Diagnosis not present

## 2019-03-16 DIAGNOSIS — M9902 Segmental and somatic dysfunction of thoracic region: Secondary | ICD-10-CM | POA: Diagnosis not present

## 2019-03-18 DIAGNOSIS — M5412 Radiculopathy, cervical region: Secondary | ICD-10-CM | POA: Diagnosis not present

## 2019-03-18 DIAGNOSIS — M9901 Segmental and somatic dysfunction of cervical region: Secondary | ICD-10-CM | POA: Diagnosis not present

## 2019-03-18 DIAGNOSIS — M9903 Segmental and somatic dysfunction of lumbar region: Secondary | ICD-10-CM | POA: Diagnosis not present

## 2019-03-18 DIAGNOSIS — M9902 Segmental and somatic dysfunction of thoracic region: Secondary | ICD-10-CM | POA: Diagnosis not present

## 2019-03-19 DIAGNOSIS — M9902 Segmental and somatic dysfunction of thoracic region: Secondary | ICD-10-CM | POA: Diagnosis not present

## 2019-03-19 DIAGNOSIS — M9901 Segmental and somatic dysfunction of cervical region: Secondary | ICD-10-CM | POA: Diagnosis not present

## 2019-03-19 DIAGNOSIS — M9903 Segmental and somatic dysfunction of lumbar region: Secondary | ICD-10-CM | POA: Diagnosis not present

## 2019-03-19 DIAGNOSIS — M5412 Radiculopathy, cervical region: Secondary | ICD-10-CM | POA: Diagnosis not present

## 2019-03-23 DIAGNOSIS — M9903 Segmental and somatic dysfunction of lumbar region: Secondary | ICD-10-CM | POA: Diagnosis not present

## 2019-03-23 DIAGNOSIS — M9901 Segmental and somatic dysfunction of cervical region: Secondary | ICD-10-CM | POA: Diagnosis not present

## 2019-03-23 DIAGNOSIS — M9902 Segmental and somatic dysfunction of thoracic region: Secondary | ICD-10-CM | POA: Diagnosis not present

## 2019-03-23 DIAGNOSIS — M5412 Radiculopathy, cervical region: Secondary | ICD-10-CM | POA: Diagnosis not present

## 2019-03-25 DIAGNOSIS — M9901 Segmental and somatic dysfunction of cervical region: Secondary | ICD-10-CM | POA: Diagnosis not present

## 2019-03-25 DIAGNOSIS — M9903 Segmental and somatic dysfunction of lumbar region: Secondary | ICD-10-CM | POA: Diagnosis not present

## 2019-03-25 DIAGNOSIS — M9902 Segmental and somatic dysfunction of thoracic region: Secondary | ICD-10-CM | POA: Diagnosis not present

## 2019-03-25 DIAGNOSIS — M5412 Radiculopathy, cervical region: Secondary | ICD-10-CM | POA: Diagnosis not present

## 2019-03-31 DIAGNOSIS — M1712 Unilateral primary osteoarthritis, left knee: Secondary | ICD-10-CM | POA: Diagnosis not present

## 2019-03-31 DIAGNOSIS — Z5189 Encounter for other specified aftercare: Secondary | ICD-10-CM | POA: Diagnosis not present

## 2019-03-31 DIAGNOSIS — M25562 Pain in left knee: Secondary | ICD-10-CM | POA: Diagnosis not present

## 2019-04-01 DIAGNOSIS — M9901 Segmental and somatic dysfunction of cervical region: Secondary | ICD-10-CM | POA: Diagnosis not present

## 2019-04-01 DIAGNOSIS — M9902 Segmental and somatic dysfunction of thoracic region: Secondary | ICD-10-CM | POA: Diagnosis not present

## 2019-04-01 DIAGNOSIS — M9903 Segmental and somatic dysfunction of lumbar region: Secondary | ICD-10-CM | POA: Diagnosis not present

## 2019-04-01 DIAGNOSIS — M5412 Radiculopathy, cervical region: Secondary | ICD-10-CM | POA: Diagnosis not present

## 2019-04-02 DIAGNOSIS — M5412 Radiculopathy, cervical region: Secondary | ICD-10-CM | POA: Diagnosis not present

## 2019-04-02 DIAGNOSIS — M9901 Segmental and somatic dysfunction of cervical region: Secondary | ICD-10-CM | POA: Diagnosis not present

## 2019-04-02 DIAGNOSIS — M9903 Segmental and somatic dysfunction of lumbar region: Secondary | ICD-10-CM | POA: Diagnosis not present

## 2019-04-02 DIAGNOSIS — M9902 Segmental and somatic dysfunction of thoracic region: Secondary | ICD-10-CM | POA: Diagnosis not present

## 2019-04-06 ENCOUNTER — Ambulatory Visit (INDEPENDENT_AMBULATORY_CARE_PROVIDER_SITE_OTHER): Payer: BLUE CROSS/BLUE SHIELD | Admitting: Family Medicine

## 2019-04-06 ENCOUNTER — Encounter: Payer: Self-pay | Admitting: Family Medicine

## 2019-04-06 ENCOUNTER — Other Ambulatory Visit: Payer: Self-pay

## 2019-04-06 VITALS — BP 150/90 | HR 85 | Temp 98.6°F | Wt 249.2 lb

## 2019-04-06 DIAGNOSIS — M5412 Radiculopathy, cervical region: Secondary | ICD-10-CM | POA: Diagnosis not present

## 2019-04-06 DIAGNOSIS — R600 Localized edema: Secondary | ICD-10-CM

## 2019-04-06 DIAGNOSIS — M9901 Segmental and somatic dysfunction of cervical region: Secondary | ICD-10-CM | POA: Diagnosis not present

## 2019-04-06 DIAGNOSIS — I10 Essential (primary) hypertension: Secondary | ICD-10-CM

## 2019-04-06 DIAGNOSIS — M9903 Segmental and somatic dysfunction of lumbar region: Secondary | ICD-10-CM | POA: Diagnosis not present

## 2019-04-06 DIAGNOSIS — R6882 Decreased libido: Secondary | ICD-10-CM | POA: Diagnosis not present

## 2019-04-06 DIAGNOSIS — M9902 Segmental and somatic dysfunction of thoracic region: Secondary | ICD-10-CM | POA: Diagnosis not present

## 2019-04-06 LAB — TESTOSTERONE: Testosterone: 326.53 ng/dL (ref 300.00–890.00)

## 2019-04-06 MED ORDER — METOPROLOL SUCCINATE ER 100 MG PO TB24: 100.0000 mg | ORAL_TABLET | Freq: Every day | ORAL | 3 refills | Status: DC

## 2019-04-06 MED ORDER — LOSARTAN POTASSIUM-HCTZ 50-12.5 MG PO TABS: 1.0000 | ORAL_TABLET | Freq: Every day | ORAL | 3 refills | Status: DC

## 2019-04-06 NOTE — Progress Notes (Signed)
Subjective:    Patient ID: Micheal Dominguez, male    DOB: Apr 17, 1966, 53 y.o.   MRN: [id number]  HPI Here for several weeks of swelling in both feet and ankles, elevated BP, and occasional elevated heart rates. He had a left knee arthroscopy per Dr. Darrelyn Hillock on 01-21-19, but he still has a good deal of pain in the knee. They are planning to do some gel injections sometime soon. His BP has been going up for several months and now is is usually in the 150s or 160s over 90s. No chest pain or SOB or headaches. Sometimes his pulse rate may get up to 100-110 at rest. He admits to putting on a lot of weight the past 6 months. He had normal labs in March with a creatinine of 0.82. He had a normal Myoview stress test last October. Also he complains of some overall fatigue and a loss of libido in the past year.    Review of Systems  Constitutional: Positive for fatigue.  Respiratory: Negative.   Cardiovascular: Positive for leg swelling. Negative for chest pain and palpitations.  Neurological: Negative.        Objective:   Physical Exam Constitutional:      General: He is not in acute distress.    Appearance: Normal appearance.  Cardiovascular:     Rate and Rhythm: Normal rate and regular rhythm.     Pulses: Normal pulses.     Heart sounds: Normal heart sounds.  Pulmonary:     Effort: Pulmonary effort is normal. No respiratory distress.     Breath sounds: Normal breath sounds. No wheezing, rhonchi or rales.  Musculoskeletal:     Comments: Both feet and ankles have 2+ edema   Neurological:     General: No focal deficit present.     Mental Status: He is alert and oriented to person, place, and time.           Assessment & Plan:  He has LE edema which I think is partly a side effect of Amlodipine. We will stop this and try Losartan HCT 50-12.5 daily. For the HTN and rapid pulse we will increase the Metoprolol to 100 mg daily. Recheck in 3-4 weeks. Also for the low libido we will check a  testosterone level today.  Gershon Crane, MD

## 2019-04-08 DIAGNOSIS — M9901 Segmental and somatic dysfunction of cervical region: Secondary | ICD-10-CM | POA: Diagnosis not present

## 2019-04-08 DIAGNOSIS — M9902 Segmental and somatic dysfunction of thoracic region: Secondary | ICD-10-CM | POA: Diagnosis not present

## 2019-04-08 DIAGNOSIS — M9903 Segmental and somatic dysfunction of lumbar region: Secondary | ICD-10-CM | POA: Diagnosis not present

## 2019-04-08 DIAGNOSIS — M5412 Radiculopathy, cervical region: Secondary | ICD-10-CM | POA: Diagnosis not present

## 2019-04-09 DIAGNOSIS — M5412 Radiculopathy, cervical region: Secondary | ICD-10-CM | POA: Diagnosis not present

## 2019-04-09 DIAGNOSIS — M9901 Segmental and somatic dysfunction of cervical region: Secondary | ICD-10-CM | POA: Diagnosis not present

## 2019-04-09 DIAGNOSIS — M9903 Segmental and somatic dysfunction of lumbar region: Secondary | ICD-10-CM | POA: Diagnosis not present

## 2019-04-09 DIAGNOSIS — M9902 Segmental and somatic dysfunction of thoracic region: Secondary | ICD-10-CM | POA: Diagnosis not present

## 2019-04-13 DIAGNOSIS — M9903 Segmental and somatic dysfunction of lumbar region: Secondary | ICD-10-CM | POA: Diagnosis not present

## 2019-04-13 DIAGNOSIS — M9902 Segmental and somatic dysfunction of thoracic region: Secondary | ICD-10-CM | POA: Diagnosis not present

## 2019-04-13 DIAGNOSIS — M9901 Segmental and somatic dysfunction of cervical region: Secondary | ICD-10-CM | POA: Diagnosis not present

## 2019-04-13 DIAGNOSIS — M5412 Radiculopathy, cervical region: Secondary | ICD-10-CM | POA: Diagnosis not present

## 2019-04-20 DIAGNOSIS — M9902 Segmental and somatic dysfunction of thoracic region: Secondary | ICD-10-CM | POA: Diagnosis not present

## 2019-04-20 DIAGNOSIS — M9903 Segmental and somatic dysfunction of lumbar region: Secondary | ICD-10-CM | POA: Diagnosis not present

## 2019-04-20 DIAGNOSIS — M5412 Radiculopathy, cervical region: Secondary | ICD-10-CM | POA: Diagnosis not present

## 2019-04-20 DIAGNOSIS — M9901 Segmental and somatic dysfunction of cervical region: Secondary | ICD-10-CM | POA: Diagnosis not present

## 2019-04-22 DIAGNOSIS — M9903 Segmental and somatic dysfunction of lumbar region: Secondary | ICD-10-CM | POA: Diagnosis not present

## 2019-04-22 DIAGNOSIS — M9902 Segmental and somatic dysfunction of thoracic region: Secondary | ICD-10-CM | POA: Diagnosis not present

## 2019-04-22 DIAGNOSIS — M5412 Radiculopathy, cervical region: Secondary | ICD-10-CM | POA: Diagnosis not present

## 2019-04-22 DIAGNOSIS — M9901 Segmental and somatic dysfunction of cervical region: Secondary | ICD-10-CM | POA: Diagnosis not present

## 2019-04-23 DIAGNOSIS — M9903 Segmental and somatic dysfunction of lumbar region: Secondary | ICD-10-CM | POA: Diagnosis not present

## 2019-04-23 DIAGNOSIS — M9901 Segmental and somatic dysfunction of cervical region: Secondary | ICD-10-CM | POA: Diagnosis not present

## 2019-04-23 DIAGNOSIS — M1712 Unilateral primary osteoarthritis, left knee: Secondary | ICD-10-CM | POA: Diagnosis not present

## 2019-04-23 DIAGNOSIS — M9902 Segmental and somatic dysfunction of thoracic region: Secondary | ICD-10-CM | POA: Diagnosis not present

## 2019-04-23 DIAGNOSIS — M5412 Radiculopathy, cervical region: Secondary | ICD-10-CM | POA: Diagnosis not present

## 2019-04-27 ENCOUNTER — Telehealth: Payer: Self-pay | Admitting: Family Medicine

## 2019-04-27 DIAGNOSIS — M9903 Segmental and somatic dysfunction of lumbar region: Secondary | ICD-10-CM | POA: Diagnosis not present

## 2019-04-27 DIAGNOSIS — M5412 Radiculopathy, cervical region: Secondary | ICD-10-CM | POA: Diagnosis not present

## 2019-04-27 DIAGNOSIS — M9901 Segmental and somatic dysfunction of cervical region: Secondary | ICD-10-CM | POA: Diagnosis not present

## 2019-04-27 DIAGNOSIS — M9902 Segmental and somatic dysfunction of thoracic region: Secondary | ICD-10-CM | POA: Diagnosis not present

## 2019-04-27 NOTE — Telephone Encounter (Signed)
Medication: fluticasone (FLONASE) 50 MCG/ACT nasal spray [[id number]] , albuterol (PROVENTIL HFA;VENTOLIN HFA) 108 (90 Base) MCG/ACT inhaler [[id number]] , triamcinolone cream (KENALOG) 0.1 % [[id number]]   Patient is requesting a 62month supply  Has the patient contacted their pharmacy?Yes  (Agent: If no, request that the patient contact the pharmacy for the refill.) (Agent: If yes, when and what did the pharmacy advise?)  Preferred Pharmacy (with phone number or street name): [facility] [place] [address] [phone]  Agent: Please be advised that RX refills may take up to 3 business days. We ask that you follow-up with your pharmacy.

## 2019-04-28 MED ORDER — ALBUTEROL SULFATE HFA 108 (90 BASE) MCG/ACT IN AERS: 2.0000 | INHALATION_SPRAY | RESPIRATORY_TRACT | 3 refills | Status: DC | PRN

## 2019-04-28 MED ORDER — TRIAMCINOLONE ACETONIDE 0.1 % EX CREA: 1.0000 "application " | TOPICAL_CREAM | Freq: Two times a day (BID) | CUTANEOUS | 3 refills | Status: DC

## 2019-04-28 MED ORDER — FLUTICASONE PROPIONATE 50 MCG/ACT NA SUSP: 2.0000 | Freq: Every day | NASAL | 3 refills | Status: DC

## 2019-04-28 NOTE — Telephone Encounter (Signed)
Refills have been sent to the pharmacy 

## 2019-04-30 DIAGNOSIS — M1712 Unilateral primary osteoarthritis, left knee: Secondary | ICD-10-CM | POA: Diagnosis not present

## 2019-05-04 DIAGNOSIS — M9903 Segmental and somatic dysfunction of lumbar region: Secondary | ICD-10-CM | POA: Diagnosis not present

## 2019-05-04 DIAGNOSIS — M5412 Radiculopathy, cervical region: Secondary | ICD-10-CM | POA: Diagnosis not present

## 2019-05-04 DIAGNOSIS — M9902 Segmental and somatic dysfunction of thoracic region: Secondary | ICD-10-CM | POA: Diagnosis not present

## 2019-05-04 DIAGNOSIS — M9901 Segmental and somatic dysfunction of cervical region: Secondary | ICD-10-CM | POA: Diagnosis not present

## 2019-05-05 ENCOUNTER — Encounter: Payer: Self-pay | Admitting: Family Medicine

## 2019-05-05 ENCOUNTER — Ambulatory Visit (INDEPENDENT_AMBULATORY_CARE_PROVIDER_SITE_OTHER): Payer: BC Managed Care – PPO | Admitting: Family Medicine

## 2019-05-05 ENCOUNTER — Other Ambulatory Visit: Payer: Self-pay

## 2019-05-05 VITALS — BP 140/92 | HR 55 | Temp 98.2°F | Wt 247.8 lb

## 2019-05-05 DIAGNOSIS — R21 Rash and other nonspecific skin eruption: Secondary | ICD-10-CM | POA: Diagnosis not present

## 2019-05-05 DIAGNOSIS — H6191 Disorder of right external ear, unspecified: Secondary | ICD-10-CM

## 2019-05-05 DIAGNOSIS — I10 Essential (primary) hypertension: Secondary | ICD-10-CM

## 2019-05-05 DIAGNOSIS — M25471 Effusion, right ankle: Secondary | ICD-10-CM | POA: Diagnosis not present

## 2019-05-05 DIAGNOSIS — M25472 Effusion, left ankle: Secondary | ICD-10-CM

## 2019-05-05 MED ORDER — LOSARTAN POTASSIUM 100 MG PO TABS: 100.0000 mg | ORAL_TABLET | Freq: Every day | ORAL | 3 refills | Status: DC

## 2019-05-05 NOTE — Progress Notes (Signed)
Subjective:    Patient ID: Micheal Dominguez, male    DOB: Apr 01, 1966, 53 y.o.   MRN: [id number]  HPI Here for several issues. First we stopped Amlodipine, started Losartan HCT, and increased the dose of Metoprolol 3 weeks ago. Since then his BP has remained borderline high, the ankle swelling has resolved, and his pulse rate has slowed down to normal. He also developed an itchy rash 2 weeks ago on the arms, legs, and trunk. No SOB. He sis applying Triamcinolone cream with little improvement. Also he has had a tender bump on the right ear lobe for the past year.    Review of Systems  Constitutional: Negative.   Respiratory: Negative.   Cardiovascular: Negative.   Skin: Positive for rash.  Neurological: Negative.        Objective:   Physical Exam Constitutional:      Appearance: Normal appearance.  HENT:     Ears:     Comments: There is a small firm scaly nodule on the superior right ear lobe  Cardiovascular:     Rate and Rhythm: Normal rate and regular rhythm.     Pulses: Normal pulses.     Heart sounds: Normal heart sounds.  Pulmonary:     Effort: Pulmonary effort is normal.     Breath sounds: Normal breath sounds.  Musculoskeletal:     Right lower leg: No edema.     Left lower leg: No edema.  Skin:    Comments: There is a fine macular erythematous rash on the arms and legs   Neurological:     Mental Status: He is alert.           Assessment & Plan:  The ankle edema has resolved since we stopped Amlodipine. The rash is likely an allergic reaction to something, and I think the HCTZ is the most likely culprit. We will stop Lsoartan HCT and switch to Losartan alone, and we will increase the dose to 100 mg daily since the BP is borderline high. For the rash he can take Zyrtec 10 mg bid for a week or two. Recheck in 4 weeks. The ear lobe lesion is concerning for a possible skin cancer. [facility].  Gershon Crane, MD

## 2019-05-06 DIAGNOSIS — M9902 Segmental and somatic dysfunction of thoracic region: Secondary | ICD-10-CM | POA: Diagnosis not present

## 2019-05-06 DIAGNOSIS — M9903 Segmental and somatic dysfunction of lumbar region: Secondary | ICD-10-CM | POA: Diagnosis not present

## 2019-05-06 DIAGNOSIS — M9901 Segmental and somatic dysfunction of cervical region: Secondary | ICD-10-CM | POA: Diagnosis not present

## 2019-05-06 DIAGNOSIS — M5412 Radiculopathy, cervical region: Secondary | ICD-10-CM | POA: Diagnosis not present

## 2019-05-07 DIAGNOSIS — M1712 Unilateral primary osteoarthritis, left knee: Secondary | ICD-10-CM | POA: Diagnosis not present

## 2019-05-11 DIAGNOSIS — M9903 Segmental and somatic dysfunction of lumbar region: Secondary | ICD-10-CM | POA: Diagnosis not present

## 2019-05-11 DIAGNOSIS — M9902 Segmental and somatic dysfunction of thoracic region: Secondary | ICD-10-CM | POA: Diagnosis not present

## 2019-05-11 DIAGNOSIS — M5412 Radiculopathy, cervical region: Secondary | ICD-10-CM | POA: Diagnosis not present

## 2019-05-11 DIAGNOSIS — M9901 Segmental and somatic dysfunction of cervical region: Secondary | ICD-10-CM | POA: Diagnosis not present

## 2019-05-13 DIAGNOSIS — M9901 Segmental and somatic dysfunction of cervical region: Secondary | ICD-10-CM | POA: Diagnosis not present

## 2019-05-13 DIAGNOSIS — M9902 Segmental and somatic dysfunction of thoracic region: Secondary | ICD-10-CM | POA: Diagnosis not present

## 2019-05-13 DIAGNOSIS — M9903 Segmental and somatic dysfunction of lumbar region: Secondary | ICD-10-CM | POA: Diagnosis not present

## 2019-05-13 DIAGNOSIS — M5412 Radiculopathy, cervical region: Secondary | ICD-10-CM | POA: Diagnosis not present

## 2019-05-18 DIAGNOSIS — M9901 Segmental and somatic dysfunction of cervical region: Secondary | ICD-10-CM | POA: Diagnosis not present

## 2019-05-18 DIAGNOSIS — M9902 Segmental and somatic dysfunction of thoracic region: Secondary | ICD-10-CM | POA: Diagnosis not present

## 2019-05-18 DIAGNOSIS — M5412 Radiculopathy, cervical region: Secondary | ICD-10-CM | POA: Diagnosis not present

## 2019-05-18 DIAGNOSIS — M9903 Segmental and somatic dysfunction of lumbar region: Secondary | ICD-10-CM | POA: Diagnosis not present

## 2019-05-20 DIAGNOSIS — M9902 Segmental and somatic dysfunction of thoracic region: Secondary | ICD-10-CM | POA: Diagnosis not present

## 2019-05-20 DIAGNOSIS — M9901 Segmental and somatic dysfunction of cervical region: Secondary | ICD-10-CM | POA: Diagnosis not present

## 2019-05-20 DIAGNOSIS — M5412 Radiculopathy, cervical region: Secondary | ICD-10-CM | POA: Diagnosis not present

## 2019-05-20 DIAGNOSIS — M9903 Segmental and somatic dysfunction of lumbar region: Secondary | ICD-10-CM | POA: Diagnosis not present

## 2019-05-26 ENCOUNTER — Ambulatory Visit (INDEPENDENT_AMBULATORY_CARE_PROVIDER_SITE_OTHER): Payer: BC Managed Care – PPO | Admitting: Family Medicine

## 2019-05-26 ENCOUNTER — Other Ambulatory Visit: Payer: Self-pay

## 2019-05-26 ENCOUNTER — Telehealth: Payer: Self-pay | Admitting: Family Medicine

## 2019-05-26 ENCOUNTER — Encounter: Payer: Self-pay | Admitting: Family Medicine

## 2019-05-26 VITALS — BP 140/84 | HR 61 | Temp 98.0°F | Wt 245.6 lb

## 2019-05-26 DIAGNOSIS — I10 Essential (primary) hypertension: Secondary | ICD-10-CM

## 2019-05-26 MED ORDER — LOSARTAN POTASSIUM-HCTZ 100-25 MG PO TABS: 1.0000 | ORAL_TABLET | Freq: Every day | ORAL | 5 refills | Status: DC

## 2019-05-26 NOTE — Telephone Encounter (Signed)
Caller name: Lars Mage  Relation to pt: [place]  Call back number: [phone] fax # [phone]    Reason for call:  Checking on the status of referral, chart reflects referral is incomplete, please advise

## 2019-05-26 NOTE — Progress Notes (Signed)
Subjective:    Patient ID: Micheal Dominguez, male    DOB: 1966-06-26, 53 y.o.   MRN: [id number]  HPI Here to follow up on HTN and ankel edema. He feels fine currently. On plain Losartan his BP is stable but he tends to getting ankle swelling. No SOB. Then if he gets back on Losartan HCT, the swelling goes away. This last time he did not get a rash, so he thinks that he is not allergic to HCTZ after all.    Review of Systems  Constitutional: Negative.   Respiratory: Negative.   Cardiovascular: Positive for leg swelling. Negative for chest pain and palpitations.       Objective:   Physical Exam Constitutional:      Appearance: Normal appearance.  Cardiovascular:     Rate and Rhythm: Normal rate and regular rhythm.     Pulses: Normal pulses.     Heart sounds: Normal heart sounds.  Pulmonary:     Effort: Pulmonary effort is normal.     Breath sounds: Normal breath sounds.  Musculoskeletal:     Right lower leg: No edema.     Left lower leg: No edema.  Neurological:     Mental Status: He is alert.           Assessment & Plan:  HTN, we will get back on Losaratn HCT 100/25 daily along with the Metoprolol. Recheck prn.  Gershon Crane, MD

## 2019-05-26 NOTE — Telephone Encounter (Signed)
Neoma Laming please advise what else needs to be done

## 2019-05-27 DIAGNOSIS — M9902 Segmental and somatic dysfunction of thoracic region: Secondary | ICD-10-CM | POA: Diagnosis not present

## 2019-05-27 DIAGNOSIS — M9903 Segmental and somatic dysfunction of lumbar region: Secondary | ICD-10-CM | POA: Diagnosis not present

## 2019-05-27 DIAGNOSIS — M5412 Radiculopathy, cervical region: Secondary | ICD-10-CM | POA: Diagnosis not present

## 2019-05-27 DIAGNOSIS — M9901 Segmental and somatic dysfunction of cervical region: Secondary | ICD-10-CM | POA: Diagnosis not present

## 2019-05-29 ENCOUNTER — Other Ambulatory Visit: Payer: Self-pay | Admitting: Family Medicine

## 2019-06-01 ENCOUNTER — Telehealth: Payer: Self-pay | Admitting: Family Medicine

## 2019-06-01 NOTE — Telephone Encounter (Signed)
Referral Request - Has patient seen PCP for this complaint? Yes.   *If NO, is insurance requiring patient see PCP for this issue before PCP can refer them? Referral for which specialty: DERMATOLOGY Preferred provider/office: [facility] Reason for referral: Skin lesion of right ear  Patient stated he called [facility] and they didn't have any appointments until November. He needed something sooner because of the pain he is having.

## 2019-06-02 DIAGNOSIS — M25562 Pain in left knee: Secondary | ICD-10-CM | POA: Diagnosis not present

## 2019-06-02 DIAGNOSIS — M1712 Unilateral primary osteoarthritis, left knee: Secondary | ICD-10-CM | POA: Diagnosis not present

## 2019-06-02 NOTE — Telephone Encounter (Signed)
A message has been sent to the referral coordinator for assistance with this.

## 2019-06-02 NOTE — Telephone Encounter (Signed)
Spoke with Micheal Dominguez. She is sending the referral to [facility] to check with them.

## 2019-06-03 ENCOUNTER — Ambulatory Visit: Payer: BC Managed Care – PPO | Admitting: Family Medicine

## 2019-06-03 DIAGNOSIS — M9903 Segmental and somatic dysfunction of lumbar region: Secondary | ICD-10-CM | POA: Diagnosis not present

## 2019-06-03 DIAGNOSIS — M5412 Radiculopathy, cervical region: Secondary | ICD-10-CM | POA: Diagnosis not present

## 2019-06-03 DIAGNOSIS — M9901 Segmental and somatic dysfunction of cervical region: Secondary | ICD-10-CM | POA: Diagnosis not present

## 2019-06-03 DIAGNOSIS — M9902 Segmental and somatic dysfunction of thoracic region: Secondary | ICD-10-CM | POA: Diagnosis not present

## 2019-06-03 NOTE — Telephone Encounter (Signed)
Patient has been informed. Nothing further needed.

## 2019-06-10 DIAGNOSIS — M5412 Radiculopathy, cervical region: Secondary | ICD-10-CM | POA: Diagnosis not present

## 2019-06-10 DIAGNOSIS — M9903 Segmental and somatic dysfunction of lumbar region: Secondary | ICD-10-CM | POA: Diagnosis not present

## 2019-06-10 DIAGNOSIS — M9901 Segmental and somatic dysfunction of cervical region: Secondary | ICD-10-CM | POA: Diagnosis not present

## 2019-06-10 DIAGNOSIS — M9902 Segmental and somatic dysfunction of thoracic region: Secondary | ICD-10-CM | POA: Diagnosis not present

## 2019-06-10 NOTE — Telephone Encounter (Signed)
Referral uploaded to [facility] in [place] Address: [place], [place], [place] [address] Phone: [phone]

## 2019-06-11 DIAGNOSIS — M25562 Pain in left knee: Secondary | ICD-10-CM | POA: Diagnosis not present

## 2019-06-18 DIAGNOSIS — M25562 Pain in left knee: Secondary | ICD-10-CM | POA: Diagnosis not present

## 2019-06-20 ENCOUNTER — Other Ambulatory Visit: Payer: Self-pay | Admitting: Family Medicine

## 2019-06-24 DIAGNOSIS — M9903 Segmental and somatic dysfunction of lumbar region: Secondary | ICD-10-CM | POA: Diagnosis not present

## 2019-06-24 DIAGNOSIS — M9901 Segmental and somatic dysfunction of cervical region: Secondary | ICD-10-CM | POA: Diagnosis not present

## 2019-06-24 DIAGNOSIS — M9902 Segmental and somatic dysfunction of thoracic region: Secondary | ICD-10-CM | POA: Diagnosis not present

## 2019-06-24 DIAGNOSIS — M5412 Radiculopathy, cervical region: Secondary | ICD-10-CM | POA: Diagnosis not present

## 2019-06-25 DIAGNOSIS — M25562 Pain in left knee: Secondary | ICD-10-CM | POA: Diagnosis not present

## 2019-06-30 DIAGNOSIS — H61001 Unspecified perichondritis of right external ear: Secondary | ICD-10-CM | POA: Diagnosis not present

## 2019-06-30 DIAGNOSIS — L820 Inflamed seborrheic keratosis: Secondary | ICD-10-CM | POA: Diagnosis not present

## 2019-06-30 DIAGNOSIS — L570 Actinic keratosis: Secondary | ICD-10-CM | POA: Diagnosis not present

## 2019-06-30 DIAGNOSIS — M25562 Pain in left knee: Secondary | ICD-10-CM | POA: Diagnosis not present

## 2019-06-30 DIAGNOSIS — D485 Neoplasm of uncertain behavior of skin: Secondary | ICD-10-CM | POA: Diagnosis not present

## 2019-07-08 DIAGNOSIS — M5412 Radiculopathy, cervical region: Secondary | ICD-10-CM | POA: Diagnosis not present

## 2019-07-08 DIAGNOSIS — M9903 Segmental and somatic dysfunction of lumbar region: Secondary | ICD-10-CM | POA: Diagnosis not present

## 2019-07-08 DIAGNOSIS — M9902 Segmental and somatic dysfunction of thoracic region: Secondary | ICD-10-CM | POA: Diagnosis not present

## 2019-07-08 DIAGNOSIS — M9901 Segmental and somatic dysfunction of cervical region: Secondary | ICD-10-CM | POA: Diagnosis not present

## 2019-07-17 DIAGNOSIS — M25562 Pain in left knee: Secondary | ICD-10-CM | POA: Diagnosis not present

## 2019-07-22 DIAGNOSIS — M9901 Segmental and somatic dysfunction of cervical region: Secondary | ICD-10-CM | POA: Diagnosis not present

## 2019-07-22 DIAGNOSIS — M9903 Segmental and somatic dysfunction of lumbar region: Secondary | ICD-10-CM | POA: Diagnosis not present

## 2019-07-22 DIAGNOSIS — M9902 Segmental and somatic dysfunction of thoracic region: Secondary | ICD-10-CM | POA: Diagnosis not present

## 2019-07-22 DIAGNOSIS — M5412 Radiculopathy, cervical region: Secondary | ICD-10-CM | POA: Diagnosis not present

## 2019-07-24 DIAGNOSIS — M1712 Unilateral primary osteoarthritis, left knee: Secondary | ICD-10-CM | POA: Diagnosis not present

## 2019-07-24 DIAGNOSIS — M25561 Pain in right knee: Secondary | ICD-10-CM | POA: Diagnosis not present

## 2019-07-24 DIAGNOSIS — M1711 Unilateral primary osteoarthritis, right knee: Secondary | ICD-10-CM | POA: Diagnosis not present

## 2019-08-05 DIAGNOSIS — T508X5D Adverse effect of diagnostic agents, subsequent encounter: Secondary | ICD-10-CM | POA: Diagnosis not present

## 2019-08-05 DIAGNOSIS — C069 Malignant neoplasm of mouth, unspecified: Secondary | ICD-10-CM | POA: Diagnosis not present

## 2019-08-05 DIAGNOSIS — R918 Other nonspecific abnormal finding of lung field: Secondary | ICD-10-CM | POA: Diagnosis not present

## 2019-08-06 DIAGNOSIS — C069 Malignant neoplasm of mouth, unspecified: Secondary | ICD-10-CM | POA: Diagnosis not present

## 2019-08-13 NOTE — Patient Instructions (Addendum)
DUE TO COVID-19 ONLY ONE VISITOR IS ALLOWED TO COME WITH YOU AND STAY IN THE WAITING ROOM ONLY DURING PRE OP AND PROCEDURE DAY OF SURGERY. THE 1 VISITOR MAY VISIT WITH YOU AFTER SURGERY IN YOUR PRIVATE ROOM DURING VISITING HOURS ONLY!  YOU NEED TO HAVE A COVID 19 TEST ON__10/10/2020_____ @__9 :25am_____, THIS TEST MUST BE DONE BEFORE SURGERY, COME  [address], [place] , [id number].  ([place]) ONCE YOUR COVID TEST IS COMPLETED, PLEASE BEGIN THE QUARANTINE INSTRUCTIONS AS OUTLINED IN YOUR HANDOUT.                Dimple Casey    Your procedure is scheduled on: Wednesday 08/19/2019   Report to [facility] Main  Entrance    Report to Short Stay at  [place] AM     Call this number if you have problems the morning of surgery [phone]    Remember: Do not eat food  :After Midnight.     NO SOLID FOOD AFTER MIDNIGHT THE NIGHT PRIOR TO SURGERY. NOTHING BY MOUTH EXCEPT CLEAR LIQUIDS UNTIL 0525 am .     PLEASE FINISH ENSURE DRINK PER SURGEON ORDER  WHICH NEEDS TO BE COMPLETED AT 0525 am .   CLEAR LIQUID DIET   Foods Allowed                                                                     Foods Excluded  Coffee and tea, regular and decaf                             liquids that you cannot  Plain Jell-O any favor except red or purple                                           see through such as: Fruit ices (not with fruit pulp)                                     milk, soups, orange juice  Iced Popsicles                                    All solid food Carbonated beverages, regular and diet                                    Cranberry, grape and apple juices Sports drinks like Gatorade Lightly seasoned clear broth or consume(fat free) Sugar, honey syrup  Sample Menu Breakfast                                Lunch  Supper Cranberry juice                    Beef broth                            Chicken broth Jell-O                                      Grape juice                           Apple juice Coffee or tea                        Jell-O                                      Popsicle                                                Coffee or tea                        Coffee or tea  _____________________________________________________________________     BRUSH YOUR TEETH MORNING OF SURGERY AND RINSE YOUR MOUTH OUT, NO CHEWING GUM CANDY OR MINTS.     Take these medicines the morning of surgery with A SIP OF WATER: use Flonase nasal spray if needed and use Albuterol inhaler if needed and bring inhaler with you to the hospital                                 You may not have any metal on your body including hair pins and              piercings  Do not wear jewelry, make-up, lotions, powders or perfumes, deodorant             Men may shave face and neck.   Do not bring valuables to the hospital. [place].  Contacts, dentures or bridgework may not be worn into surgery.  Leave suitcase in the car. After surgery it may be brought to your room.                  Please read over the following fact sheets you were given: _____________________________________________________________________             [facility] - Preparing for Surgery Before surgery, you can play an important role.  Because skin is not sterile, your skin needs to be as free of germs as possible.  You can reduce the number of germs on your skin by washing with CHG (chlorahexidine gluconate) soap before surgery.  CHG is an antiseptic cleaner which kills germs and bonds with the skin to continue killing germs even after washing. Please DO NOT use if you have an allergy to CHG or antibacterial soaps.  If your skin becomes reddened/irritated stop using the  CHG and inform your nurse when you arrive at Short Stay. Do not shave (including legs and underarms) for at least 48 hours prior to  the first CHG shower.  You may shave your face/neck. Please follow these instructions carefully:  1.  Shower with CHG Soap the night before surgery and the  morning of Surgery.  2.  If you choose to wash your hair, wash your hair first as usual with your  normal  shampoo.  3.  After you shampoo, rinse your hair and body thoroughly to remove the  shampoo.                           4.  Use CHG as you would any other liquid soap.  You can apply chg directly  to the skin and wash                       Gently with a scrungie or clean washcloth.  5.  Apply the CHG Soap to your body ONLY FROM THE NECK DOWN.   Do not use on face/ open                           Wound or open sores. Avoid contact with eyes, ears mouth and genitals (private parts).                       Wash face,  Genitals (private parts) with your normal soap.             6.  Wash thoroughly, paying special attention to the area where your surgery  will be performed.  7.  Thoroughly rinse your body with warm water from the neck down.  8.  DO NOT shower/wash with your normal soap after using and rinsing off  the CHG Soap.                9.  Pat yourself dry with a clean towel.            10.  Wear clean pajamas.            11.  Place clean sheets on your bed the night of your first shower and do not  sleep with pets. Day of Surgery : Do not apply any lotions/deodorants the morning of surgery.  Please wear clean clothes to the hospital/surgery center.  FAILURE TO FOLLOW THESE INSTRUCTIONS MAY RESULT IN THE CANCELLATION OF YOUR SURGERY PATIENT SIGNATURE_________________________________  NURSE SIGNATURE__________________________________  ________________________________________________________________________   Adam Phenix  An incentive spirometer is a tool that can help keep your lungs clear and active. This tool measures how well you are filling your lungs with each breath. Taking long deep breaths may help reverse or  decrease the chance of developing breathing (pulmonary) problems (especially infection) following:  A long period of time when you are unable to move or be active. BEFORE THE PROCEDURE   If the spirometer includes an indicator to show your best effort, your nurse or respiratory therapist will set it to a desired goal.  If possible, sit up straight or lean slightly forward. Try not to slouch.  Hold the incentive spirometer in an upright position. INSTRUCTIONS FOR USE  1. Sit on the edge of your bed if possible, or sit up as far as you can in bed or on a chair. 2. Hold the incentive spirometer in  an upright position. 3. Breathe out normally. 4. Place the mouthpiece in your mouth and seal your lips tightly around it. 5. Breathe in slowly and as deeply as possible, raising the piston or the ball toward the top of the column. 6. Hold your breath for 3-5 seconds or for as long as possible. Allow the piston or ball to fall to the bottom of the column. 7. Remove the mouthpiece from your mouth and breathe out normally. 8. Rest for a few seconds and repeat Steps 1 through 7 at least 10 times every 1-2 hours when you are awake. Take your time and take a few normal breaths between deep breaths. 9. The spirometer may include an indicator to show your best effort. Use the indicator as a goal to work toward during each repetition. 10. After each set of 10 deep breaths, practice coughing to be sure your lungs are clear. If you have an incision (the cut made at the time of surgery), support your incision when coughing by placing a pillow or rolled up towels firmly against it. Once you are able to get out of bed, walk around indoors and cough well. You may stop using the incentive spirometer when instructed by your caregiver.  RISKS AND COMPLICATIONS  Take your time so you do not get dizzy or light-headed.  If you are in pain, you may need to take or ask for pain medication before doing incentive spirometry.  It is harder to take a deep breath if you are having pain. AFTER USE  Rest and breathe slowly and easily.  It can be helpful to keep track of a log of your progress. Your caregiver can provide you with a simple table to help with this. If you are using the spirometer at home, follow these instructions: [place] IF:   You are having difficultly using the spirometer.  You have trouble using the spirometer as often as instructed.  Your pain medication is not giving enough relief while using the spirometer.  You develop fever of 100.5 F (38.1 C) or higher. SEEK IMMEDIATE MEDICAL CARE IF:   You cough up bloody sputum that had not been present before.  You develop fever of 102 F (38.9 C) or greater.  You develop worsening pain at or near the incision site. MAKE SURE YOU:   Understand these instructions.  Will watch your condition.  Will get help right away if you are not doing well or get worse. Document Released: 03/04/2007 Document Revised: 01/14/2012 Document Reviewed: 05/05/2007 ExitCare Patient Information 2014 ExitCare, [place].   ________________________________________________________________________  WHAT IS A BLOOD TRANSFUSION? Blood Transfusion Information  A transfusion is the replacement of blood or some of its parts. Blood is made up of multiple cells which provide different functions.  Red blood cells carry oxygen and are used for blood loss replacement.  White blood cells fight against infection.  Platelets control bleeding.  Plasma helps clot blood.  Other blood products are available for specialized needs, such as hemophilia or other clotting disorders. BEFORE THE TRANSFUSION  Who gives blood for transfusions?   Healthy volunteers who are fully evaluated to make sure their blood is safe. This is blood bank blood. Transfusion therapy is the safest it has ever been in the practice of medicine. Before blood is taken from a donor, a complete  history is taken to make sure that person has no history of diseases nor engages in risky social behavior (examples are intravenous drug use or sexual activity  with multiple partners). The donor's travel history is screened to minimize risk of transmitting infections, such as malaria. The donated blood is tested for signs of infectious diseases, such as HIV and hepatitis. The blood is then tested to be sure it is compatible with you in order to minimize the chance of a transfusion reaction. If you or a relative donates blood, this is often done in anticipation of surgery and is not appropriate for emergency situations. It takes many days to process the donated blood. RISKS AND COMPLICATIONS Although transfusion therapy is very safe and saves many lives, the main dangers of transfusion include:   Getting an infectious disease.  Developing a transfusion reaction. This is an allergic reaction to something in the blood you were given. Every precaution is taken to prevent this. The decision to have a blood transfusion has been considered carefully by your caregiver before blood is given. Blood is not given unless the benefits outweigh the risks. AFTER THE TRANSFUSION  Right after receiving a blood transfusion, you will usually feel much better and more energetic. This is especially true if your red blood cells have gotten low (anemic). The transfusion raises the level of the red blood cells which carry oxygen, and this usually causes an energy increase.  The nurse administering the transfusion will monitor you carefully for complications. HOME CARE INSTRUCTIONS  No special instructions are needed after a transfusion. You may find your energy is better. Speak with your caregiver about any limitations on activity for underlying diseases you may have. SEEK MEDICAL CARE IF:   Your condition is not improving after your transfusion.  You develop redness or irritation at the intravenous (IV) site. SEEK  IMMEDIATE MEDICAL CARE IF:  Any of the following symptoms occur over the next 12 hours:  Shaking chills.  You have a temperature by mouth above 102 F (38.9 C), not controlled by medicine.  Chest, back, or muscle pain.  People around you feel you are not acting correctly or are confused.  Shortness of breath or difficulty breathing.  Dizziness and fainting.  You get a rash or develop hives.  You have a decrease in urine output.  Your urine turns a dark color or changes to pink, red, or brown. Any of the following symptoms occur over the next 10 days:  You have a temperature by mouth above 102 F (38.9 C), not controlled by medicine.  Shortness of breath.  Weakness after normal activity.  The white part of the eye turns yellow (jaundice).  You have a decrease in the amount of urine or are urinating less often.  Your urine turns a dark color or changes to pink, red, or brown. Document Released: 10/19/2000 Document Revised: 01/14/2012 Document Reviewed: 06/07/2008 [facility] Patient Information 2014 [place], [place].  _______________________________________________________________________

## 2019-08-14 ENCOUNTER — Encounter (HOSPITAL_COMMUNITY): Payer: Self-pay

## 2019-08-14 ENCOUNTER — Encounter (HOSPITAL_COMMUNITY)
Admission: RE | Admit: 2019-08-14 | Discharge: 2019-08-14 | Disposition: A | Discharge status: Home / Self Care | Payer: BC Managed Care – PPO | Source: Ambulatory Visit | Attending: Orthopedic Surgery | Admitting: Orthopedic Surgery

## 2019-08-14 ENCOUNTER — Other Ambulatory Visit: Payer: Self-pay

## 2019-08-14 DIAGNOSIS — M1711 Unilateral primary osteoarthritis, right knee: Secondary | ICD-10-CM | POA: Insufficient documentation

## 2019-08-14 DIAGNOSIS — Z01812 Encounter for preprocedural laboratory examination: Secondary | ICD-10-CM | POA: Insufficient documentation

## 2019-08-14 LAB — COMPREHENSIVE METABOLIC PANEL
ALT: 28 U/L (ref 0–44)
AST: 22 U/L (ref 15–41)
Albumin: 4.3 g/dL (ref 3.5–5.0)
Alkaline Phosphatase: 68 U/L (ref 38–126)
Anion gap: 10 (ref 5–15)
BUN: 19 mg/dL (ref 6–20)
CO2: 25 mmol/L (ref 22–32)
Calcium: 9.2 mg/dL (ref 8.9–10.3)
Chloride: 104 mmol/L (ref 98–111)
Creatinine, Ser: 0.88 mg/dL (ref 0.61–1.24)
GFR calc Af Amer: >60 mL/min (ref >60)
GFR calc non Af Amer: >60 mL/min (ref >60)
Glucose, Bld: 120 mg/dL — ABNORMAL HIGH (ref 70–99)
Potassium: 4.6 mmol/L (ref 3.5–5.1)
Sodium: 139 mmol/L (ref 135–145)
Total Bilirubin: 1.1 mg/dL (ref 0.3–1.2)
Total Protein: 7.6 g/dL (ref 6.5–8.1)

## 2019-08-14 LAB — CBC WITH DIFFERENTIAL/PLATELET
Abs Immature Granulocytes: 0.06 10*3/uL (ref 0.00–0.07)
Basophils Absolute: 0.1 10*3/uL (ref 0.0–0.1)
Basophils Relative: 1 %
Eosinophils Absolute: 0.4 10*3/uL (ref 0.0–0.5)
Eosinophils Relative: 4 %
HCT: 42.5 % (ref 39.0–52.0)
Hemoglobin: 13.8 g/dL (ref 13.0–17.0)
Immature Granulocytes: 1 %
Lymphocytes Relative: 26 %
Lymphs Abs: 2.4 10*3/uL (ref 0.7–4.0)
MCH: 29.3 pg (ref 26.0–34.0)
MCHC: 32.5 g/dL (ref 30.0–36.0)
MCV: 90.2 fL (ref 80.0–100.0)
Monocytes Absolute: 0.9 10*3/uL (ref 0.1–1.0)
Monocytes Relative: 10 %
Neutro Abs: 5.5 10*3/uL (ref 1.7–7.7)
Neutrophils Relative %: 58 %
Platelets: 314 10*3/uL (ref 150–400)
RBC: 4.71 MIL/uL (ref 4.22–5.81)
RDW: 12.6 % (ref 11.5–15.5)
WBC: 9.3 10*3/uL (ref 4.0–10.5)
nRBC: 0 % (ref 0.0–0.2)

## 2019-08-14 LAB — APTT: aPTT: 26 seconds (ref 24–36)

## 2019-08-14 LAB — SURGICAL PCR SCREEN
MRSA, PCR: NEGATIVE
Staphylococcus aureus: NEGATIVE

## 2019-08-14 LAB — PROTIME-INR
INR: 0.9 (ref 0.8–1.2)
Prothrombin Time: 11.7 seconds (ref 11.4–15.2)

## 2019-08-14 LAB — ABO/RH: ABO/RH(D): A POS

## 2019-08-14 NOTE — Progress Notes (Signed)
   08/14/19 1117  OBSTRUCTIVE SLEEP APNEA  Have you ever been diagnosed with sleep apnea through a sleep study? No  Do you snore loudly (loud enough to be heard through closed doors)?  0  Do you often feel tired, fatigued, or sleepy during the daytime (such as falling asleep during driving or talking to someone)? 0  Has anyone observed you stop breathing during your sleep? 0  Do you have, or are you being treated for high blood pressure? 1  BMI more than 35 kg/m2? 1  Age > 50 (1-yes) 1  Neck circumference greater than:Male 16 inches or larger, Male 17inches or larger? 1  Male Gender (Yes=1) 1  Obstructive Sleep Apnea Score 5

## 2019-08-14 NOTE — Progress Notes (Addendum)
PCP - Dr. Alysia Penna medical clearance on 07/30/2019 in chart.   Cardiologist - none  Chest x-ray - 01/21/2019 2 view EKG - 01/21/2019  Stress Test - 08/13/2019 ECHO - 02/23/2014 Cardiac Cath - none  Sleep Study - none CPAP - none  Fasting Blood Sugar - NA Checks Blood Sugar _____ times a day  Blood Thinner Instructions: Aspirin Instructions: Per Dr. Sarajane Jews patient is to stop asa 3 days prior to surgery.   Last Dose: 08/15/2019  Anesthesia review: Chart sent to Konrad Felix PA  HX of: HTN, RBBB  Patient denies shortness of breath, fever, cough and chest pain at PAT appointment   Patient verbalized understanding of instructions that were given to them at the PAT appointment. Patient was also instructed that they will need to review over the PAT instructions again at home before surgery.

## 2019-08-15 ENCOUNTER — Other Ambulatory Visit (HOSPITAL_COMMUNITY)
Admission: RE | Admit: 2019-08-15 | Discharge: 2019-08-15 | Disposition: A | Discharge status: Home / Self Care | Payer: BC Managed Care – PPO | Source: Ambulatory Visit | Attending: Orthopedic Surgery | Admitting: Orthopedic Surgery

## 2019-08-15 DIAGNOSIS — Z20828 Contact with and (suspected) exposure to other viral communicable diseases: Secondary | ICD-10-CM | POA: Diagnosis not present

## 2019-08-15 DIAGNOSIS — Z01812 Encounter for preprocedural laboratory examination: Secondary | ICD-10-CM | POA: Insufficient documentation

## 2019-08-16 LAB — NOVEL CORONAVIRUS, NAA (HOSP ORDER, SEND-OUT TO REF LAB; TAT 18-24 HRS): SARS-CoV-2, NAA: NOT DETECTED

## 2019-08-17 NOTE — H&P (Signed)
TOTAL KNEE ADMISSION H&P  Patient is being admitted for right total knee arthroplasty.  Subjective:  Chief Complaint:right knee pain.  HPI: Micheal Dominguez, 53 y.o. male, has a history of pain and functional disability in the right knee due to arthritis and has failed non-surgical conservative treatments for greater than 12 weeks to includeNSAID's and/or analgesics, corticosteriod injections, viscosupplementation injections, flexibility and strengthening excercises, weight reduction as appropriate and activity modification.  Onset of symptoms was gradual, starting 5 years ago with gradually worsening course since that time. The patient noted prior procedures on the knee to include  arthroscopy and menisectomy on the right knee(s).  Patient currently rates pain in the right knee(s) at 7 out of 10 with activity. Patient has night pain, worsening of pain with activity and weight bearing, pain that interferes with activities of daily living, pain with passive range of motion, crepitus and joint swelling.  Patient has evidence of periarticular osteophytes and joint space narrowing by imaging studies. There is no active infection.  Patient Active Problem List   Diagnosis Date Noted  . HYPERLIPIDEMIA 09/07/2008  . Essential hypertension 09/07/2008  . PSORIASIS 09/07/2008  . HYPERGLYCEMIA 09/07/2008   Past Medical History:  Diagnosis Date  . Cancer ([place])    jaw  . Hypertension   . RBBB    Hx - no problems    Past Surgical History:  Procedure Laterality Date  . COLONOSCOPY  06/08/2016   per Dr. Henrene Pastor, no polyps, has diverticulosis and int. hemorrhoids, repeat in 10 yrs   . KNEE ARTHROSCOPY     right  . KNEE ARTHROSCOPY WITH MEDIAL MENISECTOMY Left 01/21/2019   Procedure: Left knee arthroscopy with medial menisectomy, debridement, chondroplasty;  Surgeon: [name], MD;  Location: [facility] ORS;  Service: Orthopedics;  Laterality: Left;  2min  . MANDIBLE SURGERY  09/25/2018   cancer- done at  [facility]    Current Facility-Administered Medications  Medication Dose Route Frequency Provider Last Rate Last Dose  . 0.9 %  sodium chloride infusion  500 mL Intravenous Continuous Irene Shipper, MD       Current Outpatient Medications  Medication Sig Dispense Refill Last Dose  . albuterol (VENTOLIN HFA) 108 (90 Base) MCG/ACT inhaler Inhale 2 puffs into the lungs every 4 (four) hours as needed for wheezing or shortness of breath. 24 g 3   . Ascorbic Acid (VITAMIN C PO) Take 1,000 mg by mouth daily.      Marland Kitchen aspirin EC 325 MG tablet Take 1 tablet (325 mg total) by mouth 2 (two) times daily. (Patient taking differently: Take 81 mg by mouth daily. ) 30 tablet 0   . cholecalciferol (VITAMIN D) 25 MCG (1000 UT) tablet Take 1,000 Units by mouth daily.     . fluticasone (FLONASE) 50 MCG/ACT nasal spray Place 2 sprays into both nostrils daily. (Patient taking differently: Place 2 sprays into both nostrils as needed for allergies or rhinitis. ) 48 g 3   . Krill Oil 350 MG CAPS Take 350 mg by mouth daily.      Marland Kitchen losartan-hydrochlorothiazide (HYZAAR) 100-25 MG tablet Take 1 tablet by mouth daily. 30 tablet 5   . MAGNESIUM PO Take 400 mg by mouth daily.      . metoprolol succinate (TOPROL-XL) 100 MG 24 hr tablet TAKE 1 TABLET BY MOUTH EVERY DAY WITH OR FOLLOWING A MEAL (Patient taking differently: Take 100 mg by mouth every evening. ) 30 tablet 2   . SUPER B COMPLEX/C  PO Take 1 capsule by mouth daily.     Marland Kitchen triamcinolone cream (KENALOG) 0.1 % Apply 1 application topically 2 (two) times daily. (Patient taking differently: Apply 1 application topically as needed (Rash). ) 45 g 3   . methocarbamol (ROBAXIN) 500 MG tablet Take 1 tablet (500 mg total) by mouth 4 (four) times daily. (Patient not taking: Reported on 08/12/2019) 40 tablet 0 Not Taking at Unknown time   Allergies  Allergen Reactions  . Contrast Media [Iodinated Diagnostic Agents] Other (See Comments) and Cough    Uncontrollable  gagging/coughing  . Lisinopril Cough  . Penicillins Other (See Comments)    Did it involve swelling of the face/tongue/throat, SOB, or low BP? Unknown Did it involve sudden or severe rash/hives, skin peeling, or any reaction on the inside of your mouth or nose? Unknown Did you need to seek medical attention at a hospital or doctor's office? Yes When did it last happen?Infantile Reaction If all above answers are "NO", may proceed with cephalosporin use. Childhood reaction    Social History   Tobacco Use  . Smoking status: Never Smoker  . Smokeless tobacco: Former Systems developer    Types: Chew  Substance Use Topics  . Alcohol use: Yes    Alcohol/week: 28.0 standard drinks    Types: 28 Cans of beer per week    Comment: 4 beers/ day    Family History  Problem Relation Age of Onset  . Cancer Father        prostate  . Cancer Brother        prostate  . Heart disease Other   . Diabetes Other   . Hyperlipidemia Other   . Hypertension Other   . Cancer Other        lung,prostate  . Stroke Other   . Colon cancer Neg Hx   . Esophageal cancer Neg Hx   . Rectal cancer Neg Hx   . Stomach cancer Neg Hx      Review of Systems  Constitutional: Negative.   Eyes: Negative.   Respiratory: Negative.   Cardiovascular: Negative.   Gastrointestinal: Negative.   Genitourinary: Negative.   Musculoskeletal: Positive for joint pain and myalgias. Negative for back pain, falls and neck pain.  Neurological: Negative.   Endo/Heme/Allergies: Negative.   Psychiatric/Behavioral: Negative.    Vitals Ht: 5 ft 7 in Wt: 245.2 lbs BMI: 38.4 BP: 145/85 HR:      72 bpm  Objective:  Physical Exam  Constitutional: He is oriented to person, place, and time. He appears well-developed. No distress.  Morbidly obese  HENT:  Head: Normocephalic and atraumatic.  Right Ear: External ear normal.  Left Ear: External ear normal.  Nose: Nose normal.  Mouth/Throat: Oropharynx is clear and moist.  Eyes:  Conjunctivae and EOM are normal.  Neck: Normal range of motion. Neck supple.  Cardiovascular: Normal rate, regular rhythm, normal heart sounds and intact distal pulses.  No murmur heard. Respiratory: Effort normal and breath sounds normal. No respiratory distress. He has no wheezes.  GI: Soft. Bowel sounds are normal. He exhibits no distension. There is no abdominal tenderness.  Musculoskeletal:     Right hip: Normal.     Left hip: Normal.     Comments: Incisions over left knee healed with no signs of infection. Moderate effusion about the left knee. No erythema. ROM 5-120 degrees. Tender along the medial joint line. RIght knee tender along the medial joint line with moderate patellofemoral crepitus. No effusion or instability. Varus deformity  noted. 5-120 degrees.  Neurological: He is alert and oriented to person, place, and time. He has normal strength. No sensory deficit.  Skin: No rash noted. He is not diaphoretic. No erythema.  Psychiatric: He has a normal mood and affect. His behavior is normal.      Imaging Review Plain radiographs demonstrate severe degenerative joint disease of the right knee(s). The overall alignment issignificant varus. The bone quality appears to be good for age and reported activity level.    Assessment/Plan:  End stage arthritis, right knee   The patient history, physical examination, clinical judgment of the provider and imaging studies are consistent with end stage degenerative joint disease of the right knee(s) and total knee arthroplasty is deemed medically necessary. The treatment options including medical management, injection therapy arthroscopy and arthroplasty were discussed at length. The risks and benefits of total knee arthroplasty were presented and reviewed. The risks due to aseptic loosening, infection, stiffness, patella tracking problems, thromboembolic complications and other imponderables were discussed. The patient acknowledged the  explanation, agreed to proceed with the plan and consent was signed. Patient is being admitted for inpatient treatment for surgery, pain control, PT, OT, prophylactic antibiotics, VTE prophylaxis, progressive ambulation and ADL's and discharge planning. The patient is planning to be discharged home.     Anticipated LOS equal to or greater than 2 midnights due to - Age 30 and older with one or more of the following:  - Obesity  - Expected need for hospital services (PT, OT, Nursing) required for safe  discharge  - Anticipated need for postoperative skilled nursing care or inpatient rehab  - Active co-morbidities: None OR   - Unanticipated findings during/Post Surgery: None  - Patient is a high risk of re-admission due to: None    Risks and benefits of the surgery were discussed with the patient and Dr.Gioffre at their previous office visit, and the patient has elected to move forward with the aforementioned surgery. Post-operative care plans were discussed with the patient today.   Therapy Plans: outpatient therapy at [facility] in [place] Disposition: Home with wife Planned DVT prophylaxis: aspirin 325mg  BID DME needed: has equipment PCP: Dr. Alysia Penna- clearance received Other: no anesthesia concerns  Instructed patient on which meds to stop prior to surgery    [name], PA-C

## 2019-08-18 MED ORDER — BUPIVACAINE LIPOSOME 1.3 % IJ SUSP
20.0000 mL | Freq: Once | INTRAMUSCULAR | Status: DC
  Filled 2019-08-18: qty 20

## 2019-08-18 NOTE — Anesthesia Preprocedure Evaluation (Addendum)
Anesthesia Evaluation  Patient identified by MRN, date of birth, ID band Patient awake    Reviewed: Allergy & Precautions, NPO status , Patient's Chart, lab work & pertinent test results  Airway Mallampati: II  TM Distance: >3 FB     Dental   Pulmonary    breath sounds clear to auscultation       Cardiovascular hypertension,  Rhythm:Regular Rate:Normal     Neuro/Psych    GI/Hepatic negative GI ROS, Neg liver ROS,   Endo/Other  negative endocrine ROS  Renal/GU      Musculoskeletal   Abdominal   Peds  Hematology   Anesthesia Other Findings   Reproductive/Obstetrics                            Anesthesia Physical Anesthesia Plan  ASA: III  Anesthesia Plan: Spinal   Post-op Pain Management:    Induction: Intravenous  PONV Risk Score and Plan: 1 and Ondansetron, Dexamethasone and Midazolam  Airway Management Planned: Nasal Cannula and Simple Face Mask  Additional Equipment:   Intra-op Plan:   Post-operative Plan:   Informed Consent: I have reviewed the patients History and Physical, chart, labs and discussed the procedure including the risks, benefits and alternatives for the proposed anesthesia with the patient or authorized representative who has indicated his/her understanding and acceptance.     Dental advisory given  Plan Discussed with: Anesthesiologist and CRNA  Anesthesia Plan Comments:        Anesthesia Quick Evaluation

## 2019-08-19 ENCOUNTER — Other Ambulatory Visit: Payer: Self-pay

## 2019-08-19 ENCOUNTER — Inpatient Hospital Stay (HOSPITAL_COMMUNITY): Payer: BC Managed Care – PPO | Admitting: Certified Registered Nurse Anesthetist

## 2019-08-19 ENCOUNTER — Inpatient Hospital Stay (HOSPITAL_COMMUNITY)
Admission: RE | Admit: 2019-08-19 | Discharge: 2019-08-24 | DRG: 470 | Disposition: A | Discharge status: Home / Self Care | Payer: BC Managed Care – PPO | Attending: Orthopedic Surgery | Admitting: Orthopedic Surgery

## 2019-08-19 ENCOUNTER — Inpatient Hospital Stay (HOSPITAL_COMMUNITY): Payer: BC Managed Care – PPO | Admitting: Physician Assistant

## 2019-08-19 ENCOUNTER — Encounter (HOSPITAL_COMMUNITY)
Admission: RE | Disposition: A | Discharge status: Home / Self Care | Payer: Self-pay | Source: Home / Self Care | Attending: Orthopedic Surgery

## 2019-08-19 ENCOUNTER — Encounter (HOSPITAL_COMMUNITY): Payer: Self-pay | Admitting: Certified Registered Nurse Anesthetist

## 2019-08-19 DIAGNOSIS — L409 Psoriasis, unspecified: Secondary | ICD-10-CM | POA: Diagnosis present

## 2019-08-19 DIAGNOSIS — M25561 Pain in right knee: Secondary | ICD-10-CM | POA: Diagnosis present

## 2019-08-19 DIAGNOSIS — K5900 Constipation, unspecified: Secondary | ICD-10-CM | POA: Diagnosis not present

## 2019-08-19 DIAGNOSIS — Z8249 Family history of ischemic heart disease and other diseases of the circulatory system: Secondary | ICD-10-CM

## 2019-08-19 DIAGNOSIS — I10 Essential (primary) hypertension: Secondary | ICD-10-CM | POA: Diagnosis not present

## 2019-08-19 DIAGNOSIS — Z8589 Personal history of malignant neoplasm of other organs and systems: Secondary | ICD-10-CM | POA: Diagnosis not present

## 2019-08-19 DIAGNOSIS — Z7982 Long term (current) use of aspirin: Secondary | ICD-10-CM | POA: Diagnosis not present

## 2019-08-19 DIAGNOSIS — E669 Obesity, unspecified: Secondary | ICD-10-CM | POA: Diagnosis present

## 2019-08-19 DIAGNOSIS — E785 Hyperlipidemia, unspecified: Secondary | ICD-10-CM | POA: Diagnosis not present

## 2019-08-19 DIAGNOSIS — Z6838 Body mass index (BMI) 38.0-38.9, adult: Secondary | ICD-10-CM | POA: Diagnosis not present

## 2019-08-19 DIAGNOSIS — I4510 Unspecified right bundle-branch block: Secondary | ICD-10-CM | POA: Diagnosis present

## 2019-08-19 DIAGNOSIS — Z96651 Presence of right artificial knee joint: Secondary | ICD-10-CM

## 2019-08-19 DIAGNOSIS — Z91041 Radiographic dye allergy status: Secondary | ICD-10-CM

## 2019-08-19 DIAGNOSIS — M1711 Unilateral primary osteoarthritis, right knee: Secondary | ICD-10-CM | POA: Diagnosis not present

## 2019-08-19 DIAGNOSIS — Z79899 Other long term (current) drug therapy: Secondary | ICD-10-CM | POA: Diagnosis not present

## 2019-08-19 DIAGNOSIS — J9811 Atelectasis: Secondary | ICD-10-CM | POA: Diagnosis not present

## 2019-08-19 DIAGNOSIS — Z888 Allergy status to other drugs, medicaments and biological substances status: Secondary | ICD-10-CM | POA: Diagnosis not present

## 2019-08-19 DIAGNOSIS — M79609 Pain in unspecified limb: Secondary | ICD-10-CM | POA: Diagnosis not present

## 2019-08-19 DIAGNOSIS — M24561 Contracture, right knee: Secondary | ICD-10-CM | POA: Diagnosis not present

## 2019-08-19 DIAGNOSIS — Z87891 Personal history of nicotine dependence: Secondary | ICD-10-CM

## 2019-08-19 DIAGNOSIS — Z880 Allergy status to penicillin: Secondary | ICD-10-CM

## 2019-08-19 DIAGNOSIS — M7989 Other specified soft tissue disorders: Secondary | ICD-10-CM | POA: Diagnosis not present

## 2019-08-19 DIAGNOSIS — G8918 Other acute postprocedural pain: Secondary | ICD-10-CM | POA: Diagnosis not present

## 2019-08-19 HISTORY — PX: TOTAL KNEE ARTHROPLASTY: SHX125

## 2019-08-19 LAB — TYPE AND SCREEN
ABO/RH(D): A POS
Antibody Screen: NEGATIVE

## 2019-08-19 SURGERY — ARTHROPLASTY, KNEE, TOTAL
Anesthesia: Spinal | Site: Knee | Laterality: Right

## 2019-08-19 MED ORDER — ACETAMINOPHEN 500 MG PO TABS
1000.0000 mg | ORAL_TABLET | Freq: Four times a day (QID) | ORAL | Status: AC
  Administered 2019-08-19 – 2019-08-20 (×4): 1000 mg via ORAL
  Filled 2019-08-19 (×4): qty 2

## 2019-08-19 MED ORDER — ACETAMINOPHEN 325 MG PO TABS
325.0000 mg | ORAL_TABLET | Freq: Four times a day (QID) | ORAL | Status: DC | PRN
  Administered 2019-08-20 – 2019-08-24 (×11): 650 mg via ORAL
  Filled 2019-08-19 (×11): qty 2

## 2019-08-19 MED ORDER — PROPOFOL 500 MG/50ML IV EMUL
INTRAVENOUS | Status: AC
  Filled 2019-08-19: qty 50

## 2019-08-19 MED ORDER — FLUTICASONE PROPIONATE 50 MCG/ACT NA SUSP
2.0000 | Freq: Every day | NASAL | Status: DC | PRN
  Filled 2019-08-19: qty 16

## 2019-08-19 MED ORDER — PROPOFOL 500 MG/50ML IV EMUL
INTRAVENOUS | Status: DC | PRN
  Administered 2019-08-19: 75 ug/kg/min via INTRAVENOUS

## 2019-08-19 MED ORDER — PHENOL 1.4 % MT LIQD
1.0000 | OROMUCOSAL | Status: DC | PRN
  Filled 2019-08-19: qty 177

## 2019-08-19 MED ORDER — VANCOMYCIN HCL IN DEXTROSE 1-5 GM/200ML-% IV SOLN
1000.0000 mg | INTRAVENOUS | Status: AC
  Administered 2019-08-19: 1000 mg via INTRAVENOUS

## 2019-08-19 MED ORDER — ONDANSETRON HCL 4 MG/2ML IJ SOLN
INTRAMUSCULAR | Status: DC | PRN
  Administered 2019-08-19: 4 mg via INTRAVENOUS

## 2019-08-19 MED ORDER — HYDROMORPHONE HCL 1 MG/ML IJ SOLN
INTRAMUSCULAR | Status: AC
  Filled 2019-08-19: qty 1

## 2019-08-19 MED ORDER — POLYETHYLENE GLYCOL 3350 17 G PO PACK
17.0000 g | PACK | Freq: Every day | ORAL | Status: DC | PRN
  Administered 2019-08-23: 17 g via ORAL
  Filled 2019-08-19: qty 1

## 2019-08-19 MED ORDER — TRANEXAMIC ACID-NACL 1000-0.7 MG/100ML-% IV SOLN
1000.0000 mg | INTRAVENOUS | Status: AC
  Administered 2019-08-19: 1000 mg via INTRAVENOUS

## 2019-08-19 MED ORDER — PROPOFOL 10 MG/ML IV BOLUS
INTRAVENOUS | Status: AC
  Filled 2019-08-19: qty 20

## 2019-08-19 MED ORDER — HYDROMORPHONE HCL 1 MG/ML IJ SOLN
0.2500 mg | INTRAMUSCULAR | Status: DC | PRN
  Administered 2019-08-19 (×2): 0.5 mg via INTRAVENOUS

## 2019-08-19 MED ORDER — TRANEXAMIC ACID-NACL 1000-0.7 MG/100ML-% IV SOLN
INTRAVENOUS | Status: AC
  Filled 2019-08-19: qty 100

## 2019-08-19 MED ORDER — MIDAZOLAM HCL 2 MG/2ML IJ SOLN
INTRAMUSCULAR | Status: AC
  Filled 2019-08-19: qty 2

## 2019-08-19 MED ORDER — VANCOMYCIN HCL IN DEXTROSE 1-5 GM/200ML-% IV SOLN
INTRAVENOUS | Status: AC
  Filled 2019-08-19: qty 200

## 2019-08-19 MED ORDER — SODIUM CHLORIDE (PF) 0.9 % IJ SOLN
INTRAMUSCULAR | Status: DC | PRN
  Administered 2019-08-19: 20 mL

## 2019-08-19 MED ORDER — KRILL OIL 350 MG PO CAPS: 350.0000 mg | ORAL_CAPSULE | Freq: Every day | ORAL | Status: DC

## 2019-08-19 MED ORDER — MIDAZOLAM HCL 5 MG/5ML IJ SOLN
INTRAMUSCULAR | Status: DC | PRN
  Administered 2019-08-19: 2 mg via INTRAVENOUS

## 2019-08-19 MED ORDER — SODIUM CHLORIDE 0.9 % IR SOLN
Status: DC | PRN
  Administered 2019-08-19: 1000 mL

## 2019-08-19 MED ORDER — POVIDONE-IODINE 10 % EX SWAB
2.0000 "application " | Freq: Once | CUTANEOUS | Status: AC
  Administered 2019-08-19: 2 via TOPICAL

## 2019-08-19 MED ORDER — DOCUSATE SODIUM 100 MG PO CAPS
100.0000 mg | ORAL_CAPSULE | Freq: Two times a day (BID) | ORAL | Status: DC
  Administered 2019-08-19 – 2019-08-24 (×10): 100 mg via ORAL
  Filled 2019-08-19 (×10): qty 1

## 2019-08-19 MED ORDER — ALUM & MAG HYDROXIDE-SIMETH 200-200-20 MG/5ML PO SUSP: 30.0000 mL | ORAL | Status: DC | PRN

## 2019-08-19 MED ORDER — OXYCODONE HCL 5 MG PO TABS
5.0000 mg | ORAL_TABLET | ORAL | Status: DC | PRN
  Administered 2019-08-19: 5 mg via ORAL
  Filled 2019-08-19: qty 1

## 2019-08-19 MED ORDER — DEXAMETHASONE SODIUM PHOSPHATE 10 MG/ML IJ SOLN
INTRAMUSCULAR | Status: DC | PRN
  Administered 2019-08-19: 10 mg via INTRAVENOUS

## 2019-08-19 MED ORDER — PHENYLEPHRINE HCL (PRESSORS) 10 MG/ML IV SOLN
INTRAVENOUS | Status: AC
  Filled 2019-08-19: qty 1

## 2019-08-19 MED ORDER — ONDANSETRON HCL 4 MG/2ML IJ SOLN
INTRAMUSCULAR | Status: AC
  Filled 2019-08-19: qty 2

## 2019-08-19 MED ORDER — ONDANSETRON HCL 4 MG/2ML IJ SOLN: 4.0000 mg | Freq: Four times a day (QID) | INTRAMUSCULAR | Status: DC | PRN

## 2019-08-19 MED ORDER — LIDOCAINE 2% (20 MG/ML) 5 ML SYRINGE
INTRAMUSCULAR | Status: AC
  Filled 2019-08-19: qty 5

## 2019-08-19 MED ORDER — BISACODYL 5 MG PO TBEC: 5.0000 mg | DELAYED_RELEASE_TABLET | Freq: Every day | ORAL | Status: DC | PRN

## 2019-08-19 MED ORDER — VANCOMYCIN HCL 1000 MG IV SOLR: 1000.0000 mg | Freq: Once | INTRAVENOUS | Status: DC

## 2019-08-19 MED ORDER — BUPIVACAINE IN DEXTROSE 0.75-8.25 % IT SOLN
INTRATHECAL | Status: DC | PRN
  Administered 2019-08-19: 1.8 mL via INTRATHECAL

## 2019-08-19 MED ORDER — MENTHOL 3 MG MT LOZG: 1.0000 | LOZENGE | OROMUCOSAL | Status: DC | PRN

## 2019-08-19 MED ORDER — ALBUTEROL SULFATE (2.5 MG/3ML) 0.083% IN NEBU: 2.5000 mg | INHALATION_SOLUTION | RESPIRATORY_TRACT | Status: DC | PRN

## 2019-08-19 MED ORDER — PROPOFOL 10 MG/ML IV BOLUS
INTRAVENOUS | Status: DC | PRN
  Administered 2019-08-19: 20 mg via INTRAVENOUS
  Administered 2019-08-19 (×2): 30 mg via INTRAVENOUS
  Administered 2019-08-19: 10 mg via INTRAVENOUS
  Administered 2019-08-19: 30 mg via INTRAVENOUS

## 2019-08-19 MED ORDER — SODIUM CHLORIDE 0.9 % IV SOLN
INTRAVENOUS | Status: AC
  Filled 2019-08-19: qty 500000

## 2019-08-19 MED ORDER — FENTANYL CITRATE (PF) 100 MCG/2ML IJ SOLN
INTRAMUSCULAR | Status: AC
  Filled 2019-08-19: qty 2

## 2019-08-19 MED ORDER — PHENYLEPHRINE 40 MCG/ML (10ML) SYRINGE FOR IV PUSH (FOR BLOOD PRESSURE SUPPORT)
PREFILLED_SYRINGE | INTRAVENOUS | Status: DC | PRN
  Administered 2019-08-19 (×6): 40 ug via INTRAVENOUS
  Administered 2019-08-19: 80 ug via INTRAVENOUS

## 2019-08-19 MED ORDER — VANCOMYCIN HCL IN DEXTROSE 1-5 GM/200ML-% IV SOLN
1000.0000 mg | Freq: Two times a day (BID) | INTRAVENOUS | Status: AC
  Administered 2019-08-19: 1000 mg via INTRAVENOUS
  Filled 2019-08-19: qty 200

## 2019-08-19 MED ORDER — METHOCARBAMOL 500 MG IVPB - SIMPLE MED
500.0000 mg | Freq: Four times a day (QID) | INTRAVENOUS | Status: DC | PRN
  Administered 2019-08-19: 500 mg via INTRAVENOUS
  Filled 2019-08-19: qty 50

## 2019-08-19 MED ORDER — FLEET ENEMA 7-19 GM/118ML RE ENEM: 1.0000 | ENEMA | Freq: Once | RECTAL | Status: DC | PRN

## 2019-08-19 MED ORDER — RIVAROXABAN 10 MG PO TABS
10.0000 mg | ORAL_TABLET | Freq: Every day | ORAL | Status: DC
  Administered 2019-08-20 – 2019-08-24 (×5): 10 mg via ORAL
  Filled 2019-08-19 (×5): qty 1

## 2019-08-19 MED ORDER — DEXAMETHASONE SODIUM PHOSPHATE 10 MG/ML IJ SOLN
INTRAMUSCULAR | Status: AC
  Filled 2019-08-19: qty 1

## 2019-08-19 MED ORDER — CHLORHEXIDINE GLUCONATE 4 % EX LIQD: 60.0000 mL | Freq: Once | CUTANEOUS | Status: DC

## 2019-08-19 MED ORDER — VANCOMYCIN HCL 500 MG IV SOLR
500.0000 mg | INTRAVENOUS | Status: AC
  Administered 2019-08-19: 500 mg via INTRAVENOUS
  Filled 2019-08-19: qty 500

## 2019-08-19 MED ORDER — METHOCARBAMOL 500 MG PO TABS
500.0000 mg | ORAL_TABLET | Freq: Four times a day (QID) | ORAL | Status: DC | PRN
  Administered 2019-08-19 – 2019-08-24 (×15): 500 mg via ORAL
  Filled 2019-08-19 (×16): qty 1

## 2019-08-19 MED ORDER — PHENYLEPHRINE 40 MCG/ML (10ML) SYRINGE FOR IV PUSH (FOR BLOOD PRESSURE SUPPORT)
PREFILLED_SYRINGE | INTRAVENOUS | Status: AC
  Filled 2019-08-19: qty 10

## 2019-08-19 MED ORDER — SODIUM CHLORIDE (PF) 0.9 % IJ SOLN
INTRAMUSCULAR | Status: AC
  Filled 2019-08-19: qty 20

## 2019-08-19 MED ORDER — LOSARTAN POTASSIUM 50 MG PO TABS
100.0000 mg | ORAL_TABLET | Freq: Every day | ORAL | Status: DC
  Administered 2019-08-20 – 2019-08-24 (×5): 100 mg via ORAL
  Filled 2019-08-19 (×5): qty 2

## 2019-08-19 MED ORDER — BUPIVACAINE LIPOSOME 1.3 % IJ SUSP
INTRAMUSCULAR | Status: DC | PRN
  Administered 2019-08-19: 20 mL

## 2019-08-19 MED ORDER — METOCLOPRAMIDE HCL 5 MG/ML IJ SOLN: 5.0000 mg | Freq: Three times a day (TID) | INTRAMUSCULAR | Status: DC | PRN

## 2019-08-19 MED ORDER — SODIUM CHLORIDE 0.9 % IV SOLN
INTRAVENOUS | Status: DC | PRN
  Administered 2019-08-19: 500 mL

## 2019-08-19 MED ORDER — VANCOMYCIN HCL 10 G IV SOLR
1500.0000 mg | INTRAVENOUS | Status: DC
  Filled 2019-08-19: qty 1500

## 2019-08-19 MED ORDER — HYDROCHLOROTHIAZIDE 25 MG PO TABS
25.0000 mg | ORAL_TABLET | Freq: Every day | ORAL | Status: DC
  Administered 2019-08-20: 25 mg via ORAL
  Filled 2019-08-19: qty 1

## 2019-08-19 MED ORDER — HYDROMORPHONE HCL 1 MG/ML IJ SOLN
0.5000 mg | INTRAMUSCULAR | Status: DC | PRN
  Administered 2019-08-19 – 2019-08-21 (×7): 1 mg via INTRAVENOUS
  Filled 2019-08-19 (×7): qty 1

## 2019-08-19 MED ORDER — ONDANSETRON HCL 4 MG PO TABS: 4.0000 mg | ORAL_TABLET | Freq: Four times a day (QID) | ORAL | Status: DC | PRN

## 2019-08-19 MED ORDER — LIDOCAINE 2% (20 MG/ML) 5 ML SYRINGE
INTRAMUSCULAR | Status: DC | PRN
  Administered 2019-08-19: 20 mg via INTRAVENOUS

## 2019-08-19 MED ORDER — FERROUS SULFATE 325 (65 FE) MG PO TABS
325.0000 mg | ORAL_TABLET | Freq: Three times a day (TID) | ORAL | Status: DC
  Administered 2019-08-20 – 2019-08-24 (×13): 325 mg via ORAL
  Filled 2019-08-19 (×13): qty 1

## 2019-08-19 MED ORDER — LOSARTAN POTASSIUM-HCTZ 100-25 MG PO TABS: 1.0000 | ORAL_TABLET | Freq: Every day | ORAL | Status: DC

## 2019-08-19 MED ORDER — METOCLOPRAMIDE HCL 5 MG PO TABS: 5.0000 mg | ORAL_TABLET | Freq: Three times a day (TID) | ORAL | Status: DC | PRN

## 2019-08-19 MED ORDER — METHOCARBAMOL 500 MG IVPB - SIMPLE MED
INTRAVENOUS | Status: AC
  Filled 2019-08-19: qty 50

## 2019-08-19 MED ORDER — OXYCODONE HCL 5 MG PO TABS
10.0000 mg | ORAL_TABLET | ORAL | Status: DC | PRN
  Administered 2019-08-19 (×2): 15 mg via ORAL
  Administered 2019-08-19: 10 mg via ORAL
  Administered 2019-08-20 (×4): 15 mg via ORAL
  Filled 2019-08-19: qty 3
  Filled 2019-08-19: qty 2
  Filled 2019-08-19 (×5): qty 3

## 2019-08-19 MED ORDER — LACTATED RINGERS IV SOLN
INTRAVENOUS | Status: DC
  Administered 2019-08-19 (×2): via INTRAVENOUS

## 2019-08-19 MED ORDER — FENTANYL CITRATE (PF) 100 MCG/2ML IJ SOLN
INTRAMUSCULAR | Status: DC | PRN
  Administered 2019-08-19 (×2): 50 ug via INTRAVENOUS

## 2019-08-19 MED ORDER — LACTATED RINGERS IV SOLN
INTRAVENOUS | Status: DC
  Administered 2019-08-19 (×2): via INTRAVENOUS

## 2019-08-19 SURGICAL SUPPLY — 70 items
AGENT HMST SPONGE THK3/8 (HEMOSTASIS) ×1
BAG DECANTER FOR FLEXI CONT (MISCELLANEOUS) ×2 IMPLANT
BAG SPEC THK2 15X12 ZIP CLS (MISCELLANEOUS)
BAG ZIPLOCK 12X15 (MISCELLANEOUS) IMPLANT
BLADE SAG 18X100X1.27 (BLADE) ×2 IMPLANT
BLADE SAW SGTL 11.0X1.19X90.0M (BLADE) ×2 IMPLANT
BLADE SURG SZ10 CARB STEEL (BLADE) ×4 IMPLANT
BNDG ELASTIC 4X5.8 VLCR STR LF (GAUZE/BANDAGES/DRESSINGS) ×2 IMPLANT
BNDG ELASTIC 6X5.8 VLCR STR LF (GAUZE/BANDAGES/DRESSINGS) ×2 IMPLANT
BNDG GAUZE ELAST 4 BULKY (GAUZE/BANDAGES/DRESSINGS) ×4 IMPLANT
CEMENT HV SMART SET (Cement) ×4 IMPLANT
CEMENT TIBIA MBT SIZE 5 (Knees) ×1 IMPLANT
COVER SURGICAL LIGHT HANDLE (MISCELLANEOUS) ×2 IMPLANT
COVER WAND RF STERILE (DRAPES) IMPLANT
CUFF TOURN SGL QUICK 34 (TOURNIQUET CUFF) ×2
CUFF TRNQT CYL 34X4.125X (TOURNIQUET CUFF) ×1 IMPLANT
DECANTER SPIKE VIAL GLASS SM (MISCELLANEOUS) ×4 IMPLANT
DRAPE INCISE IOBAN 66X45 STRL (DRAPES) IMPLANT
DRAPE U-SHAPE 47X51 STRL (DRAPES) ×2 IMPLANT
DRSG ADAPTIC 3X8 NADH LF (GAUZE/BANDAGES/DRESSINGS) ×2 IMPLANT
DRSG PAD ABDOMINAL 8X10 ST (GAUZE/BANDAGES/DRESSINGS) ×4 IMPLANT
DURAPREP 26ML APPLICATOR (WOUND CARE) ×2 IMPLANT
ELECT BLADE TIP CTD 4 INCH (ELECTRODE) ×4 IMPLANT
ELECT REM PT RETURN 15FT ADLT (MISCELLANEOUS) ×2 IMPLANT
EVACUATOR 1/8 PVC DRAIN (DRAIN) ×2 IMPLANT
FACESHIELD WRAPAROUND (MASK) ×4 IMPLANT
FEMUR SIGMA PS SZ 4.0 R (Femur) ×2 IMPLANT
GAUZE SPONGE 4X4 12PLY STRL (GAUZE/BANDAGES/DRESSINGS) ×2 IMPLANT
GLOVE BIOGEL PI IND STRL 6.5 (GLOVE) ×1 IMPLANT
GLOVE BIOGEL PI IND STRL 8.5 (GLOVE) ×1 IMPLANT
GLOVE BIOGEL PI INDICATOR 6.5 (GLOVE) ×1
GLOVE BIOGEL PI INDICATOR 8.5 (GLOVE) ×1
GLOVE ECLIPSE 8.0 STRL XLNG CF (GLOVE) ×4 IMPLANT
GLOVE SURG SS PI 6.5 STRL IVOR (GLOVE) ×2 IMPLANT
GOWN STRL REUS W/TWL LRG LVL3 (GOWN DISPOSABLE) ×2 IMPLANT
GOWN STRL REUS W/TWL XL LVL3 (GOWN DISPOSABLE) ×2 IMPLANT
HANDPIECE INTERPULSE COAX TIP (DISPOSABLE) ×4
HEMOSTAT SPONGE AVITENE ULTRA (HEMOSTASIS) ×2 IMPLANT
HOLDER FOLEY CATH W/STRAP (MISCELLANEOUS) ×2 IMPLANT
IMMOBILIZER KNEE 20 (SOFTGOODS) ×2
IMMOBILIZER KNEE 20 THIGH 36 (SOFTGOODS) ×1 IMPLANT
INSERT PFC SIG STB SZ4 17.5MM (Knees) ×2 IMPLANT
KIT TURNOVER KIT A (KITS) IMPLANT
MANIFOLD NEPTUNE II (INSTRUMENTS) ×2 IMPLANT
NS IRRIG 1000ML POUR BTL (IV SOLUTION) IMPLANT
PACK TOTAL KNEE CUSTOM (KITS) ×2 IMPLANT
PADDING CAST COTTON 6X4 STRL (CAST SUPPLIES) ×2 IMPLANT
PATELLA DOME PFC 41MM (Knees) ×2 IMPLANT
PENCIL SMOKE EVAC W/HOLSTER (ELECTROSURGICAL) ×2 IMPLANT
PIN STEINMAN FIXATION KNEE (PIN) ×2 IMPLANT
PROTECTOR NERVE ULNAR (MISCELLANEOUS) ×2 IMPLANT
SET HNDPC FAN SPRY TIP SCT (DISPOSABLE) ×2 IMPLANT
SET PAD KNEE POSITIONER (MISCELLANEOUS) ×2 IMPLANT
SPONGE LAP 18X18 RF (DISPOSABLE) IMPLANT
STRIP CLOSURE SKIN 1/2X4 (GAUZE/BANDAGES/DRESSINGS) ×4 IMPLANT
SUT BONE WAX W31G (SUTURE) IMPLANT
SUT MNCRL AB 4-0 PS2 18 (SUTURE) ×2 IMPLANT
SUT STRATAFIX 0 PDS 27 VIOLET (SUTURE) ×2
SUT VIC AB 1 CT1 27 (SUTURE) ×2
SUT VIC AB 1 CT1 27XBRD ANTBC (SUTURE) ×1 IMPLANT
SUT VIC AB 2-0 CT1 27 (SUTURE) ×8
SUT VIC AB 2-0 CT1 TAPERPNT 27 (SUTURE) ×4 IMPLANT
SUTURE STRATFX 0 PDS 27 VIOLET (SUTURE) ×1 IMPLANT
SYR 20ML LL LF (SYRINGE) ×4 IMPLANT
TIBIA MBT CEMENT SIZE 5 (Knees) ×2 IMPLANT
TOWER CARTRIDGE SMART MIX (DISPOSABLE) ×2 IMPLANT
TRAY FOLEY MTR SLVR 16FR STAT (SET/KITS/TRAYS/PACK) ×2 IMPLANT
WATER STERILE IRR 1000ML POUR (IV SOLUTION) ×4 IMPLANT
WRAP KNEE MAXI GEL POST OP (GAUZE/BANDAGES/DRESSINGS) ×2 IMPLANT
YANKAUER SUCT BULB TIP 10FT TU (MISCELLANEOUS) ×2 IMPLANT

## 2019-08-19 NOTE — Evaluation (Signed)
Physical Therapy Evaluation Patient Details Name: Micheal Dominguez MRN: [id number] DOB: 11-05-1966 Today's Date: 08/19/2019   History of Present Illness  Patient is 53 y.o. male s/p Rt TKA with PMH significant for HTN and jaw cancer.  Clinical Impression  Micheal Dominguez is a 53 y.o. male POD 0 s/p Rt TKA. Patient reports independence with mobility at baseline. Patient is now limited by functional impairments (see PT problem list below) and requires min assist for transfers and gait with RW. Patient was able to ambulate ~80 feet with RW and cues for safe use. Patient instructed in exercise to facilitate ROM and circulation to manage edema; he was limited by pain and unable to complete heel slides this session. Patient will benefit from continued skilled PT interventions to address impairments and progress towards PLOF. Acute PT will follow to progress mobility and stair training in preparation for safe discharge home.     Follow Up Recommendations Follow surgeon's recommendation for DC plan and follow-up therapies    Equipment Recommendations  None recommended by PT    Recommendations for Other Services       Precautions / Restrictions Precautions Precautions: Fall Required Braces or Orthoses: Knee Immobilizer - Right Knee Immobilizer - Right: Other (comment)(pt with poor quad activation) Restrictions Weight Bearing Restrictions: No      Mobility  Bed Mobility Overal bed mobility: Needs Assistance Bed Mobility: Supine to Sit     Supine to sit: Min assist;HOB elevated     General bed mobility comments: cues for sequencing and use of bed rails, assist for LE mobiltiy and to raise trunk upright fully  Transfers Overall transfer level: Needs assistance Equipment used: Rolling Martin (2 wheeled) Transfers: Sit to/from Stand Sit to Stand: Min assist;From elevated surface         General transfer comment: cues for safe hand placement and technique with RW, assist to initiate  power up and steady upon rising  Ambulation/Gait Ambulation/Gait assistance: Min assist;Min guard Gait Distance (Feet): 80 Feet Assistive device: Rolling Kwiecinski (2 wheeled) Gait Pattern/deviations: Antalgic;Step-to pattern;Decreased step length - left;Decreased stride length;Decreased stance time - right;Trunk flexed Gait velocity: decreased   General Gait Details: cues at start for safe hand placement with RW and for safe proximity to Jaffer, pt wtih flexed trunk requiring cues for posture  Stairs            Wheelchair Mobility    Modified Rankin (Stroke Patients Only)       Balance Overall balance assessment: Needs assistance Sitting-balance support: No upper extremity supported;Feet supported Sitting balance-Leahy Scale: Good     Standing balance support: During functional activity;Bilateral upper extremity supported Standing balance-Leahy Scale: Poor                Pertinent Vitals/Pain Pain Assessment: 0-10 Pain Score: 7  Pain Location: Rt knee Pain Descriptors / Indicators: Aching;Sore;Discomfort Pain Intervention(s): Limited activity within patient's tolerance;Monitored during session;Repositioned;Patient requesting pain meds-RN notified;Ice applied    Home Living Family/patient expects to be discharged to:: Private residence Living Arrangements: Spouse/significant other Available Help at Discharge: Family(home for 2 weeks to assist) Type of Home: House Home Access: Stairs to enter Entrance Stairs-Rails: None Entrance Stairs-Number of Steps: 3 Home Layout: Two level;Able to live on main level with bedroom/bathroom;Bed/bath upstairs Home Equipment: Barno - 2 wheels;Cane - single point;Shower seat Additional Comments: pt has guest room with tub shower on main floor but wants to go upstairs to his bed/bath as he has an electric adjsutable bed and  walk in shower upstairs    Prior Function Level of Independence: Independent               Hand  Dominance   Dominant Hand: Right    Extremity/Trunk Assessment   Upper Extremity Assessment Upper Extremity Assessment: Overall WFL for tasks assessed    Lower Extremity Assessment Lower Extremity Assessment: RLE deficits/detail RLE Deficits / Details: light touch appears intact, pt with poor quad activation in supine and very limited with SLR (immobilizer applied for safety with mobility) RLE: Unable to fully assess due to immobilization RLE Sensation: WNL RLE Coordination: WNL    Cervical / Trunk Assessment Cervical / Trunk Assessment: Normal  Communication   Communication: No difficulties  Cognition Arousal/Alertness: Awake/alert Behavior During Therapy: WFL for tasks assessed/performed Overall Cognitive Status: Within Functional Limits for tasks assessed                 General Comments      Exercises Total Joint Exercises Ankle Circles/Pumps: AROM;10 reps;Seated;Both Quad Sets: AROM;10 reps;Seated;Supine;Right   Assessment/Plan    PT Assessment Patient needs continued PT services  PT Problem List Decreased strength;Decreased balance;Decreased mobility;Decreased range of motion;Decreased activity tolerance;Decreased knowledge of use of DME       PT Treatment Interventions DME instruction;Functional mobility training;Balance training;Patient/family education;Modalities;Therapeutic activities;Gait training;Stair training;Therapeutic exercise    PT Goals (Current goals can be found in the Care Plan section)  Acute Rehab PT Goals Patient Stated Goal: to gain back full ROM of knee PT Goal Formulation: With patient Time For Goal Achievement: 08/26/19 Potential to Achieve Goals: Good    Frequency 7X/week    AM-PAC PT "6 Clicks" Mobility  Outcome Measure Help needed turning from your back to your side while in a flat bed without using bedrails?: A Little Help needed moving from lying on your back to sitting on the side of a flat bed without using bedrails?: A  Little Help needed moving to and from a bed to a chair (including a wheelchair)?: A Little Help needed standing up from a chair using your arms (e.g., wheelchair or bedside chair)?: A Little Help needed to walk in hospital room?: A Little Help needed climbing 3-5 steps with a railing? : A Little 6 Click Score: 18    End of Session Equipment Utilized During Treatment: Gait belt Activity Tolerance: Patient tolerated treatment well Patient left: in chair;with call bell/phone within reach;with chair alarm set;with family/visitor present Nurse Communication: Mobility status;Patient requests pain meds PT Visit Diagnosis: Difficulty in walking, not elsewhere classified (R26.2);Muscle weakness (generalized) (M62.81)    Time: 1610-9604 PT Time Calculation (min) (ACUTE ONLY): 32 min   Charges:   PT Evaluation $PT Eval Low Complexity: 1 Low PT Treatments $Gait Training: 8-22 mins        Valentino Saxon, PT, DPT Physical Therapist with [facility]  08/19/2019 5:25 PM

## 2019-08-19 NOTE — Anesthesia Procedure Notes (Addendum)
Anesthesia Regional Block: Adductor canal block   Pre-Anesthetic Checklist: ,, timeout performed, Correct Patient, Correct Site, Correct Laterality, Correct Procedure, Correct Position, site marked, Risks and benefits discussed,  Surgical consent,  Pre-op evaluation,  At surgeon's request and post-op pain management  Laterality: Right  Prep: chloraprep       Needles:   Needle Type: Echogenic Stimulator Needle          Additional Needles:   Procedures: Doppler guided,,,, ultrasound used (permanent image in chart),,,,  Narrative:  Start time: 08/19/2019 8:05 AM End time: 08/19/2019 8:20 AM Injection made incrementally with aspirations every 5 mL.  Performed by: Personally  Anesthesiologist: Belinda Block, MD

## 2019-08-19 NOTE — Anesthesia Procedure Notes (Signed)
Spinal  Patient location during procedure: OR Start time: 08/19/2019 8:34 AM End time: 08/19/2019 8:38 AM Staffing Resident/CRNA: Montel Clock, CRNA Performed: resident/CRNA  Preanesthetic Checklist Completed: patient identified, surgical consent, pre-op evaluation, timeout performed, IV checked, risks and benefits discussed and monitors and equipment checked Spinal Block Patient position: sitting Prep: DuraPrep Patient monitoring: heart rate, cardiac monitor, continuous pulse ox and blood pressure Approach: midline Location: L3-4 Injection technique: single-shot Needle Needle type: Pencan  Needle gauge: 24 G Needle length: 10 cm Needle insertion depth: 9 cm Assessment Sensory level: T6

## 2019-08-19 NOTE — Discharge Instructions (Addendum)
Dr.Ronald Gioffre [facility] [address], [place] [address] [phone]  TOTAL KNEE REPLACEMENT POSTOPERATIVE DIRECTIONS  Knee Rehabilitation, Guidelines Following Surgery  Results after knee surgery are often greatly improved when you follow the exercise, range of motion and muscle strengthening exercises prescribed by your doctor. Safety measures are also important to protect the knee from further injury. Any time any of these exercises cause you to have increased pain or swelling in your knee joint, decrease the amount until you are comfortable again and slowly increase them. If you have problems or questions, call your caregiver or physical therapist for advice.   HOME CARE INSTRUCTIONS  Remove items at home which could result in a fall. This includes throw rugs or furniture in walking pathways.   ICE to the affected knee every three hours for 30 minutes at a time and then as needed for pain and swelling.  Continue to use ice on the knee for pain and swelling from surgery. You may notice swelling that will progress down to the foot and ankle.  This is normal after surgery.  Elevate the leg when you are not up walking on it.    Continue to use the breathing machine which will help keep your temperature down.  It is common for your temperature to cycle up and down following surgery, especially at night when you are not up moving around and exerting yourself.  The breathing machine keeps your lungs expanded and your temperature down.  Do not place pillow under knee, focus on keeping the knee straight while resting  DIET You may resume your previous home diet once your are discharged from the hospital.  DRESSING / WOUND CARE / SHOWERING You may shower 3 days after surgery, but keep the wounds dry during showering.  You may use an occlusive plastic wrap (Press'n Seal for example), NO SOAKING/SUBMERGING IN THE BATHTUB.  If the bandage gets wet, change with a clean  dry gauze.  If the incision gets wet, pat the wound dry with a clean towel. You may start showering once you are discharged home but do not submerge the incision under water. Just pat the incision dry and apply a dry gauze dressing on daily. Change the surgical dressing daily and reapply a dry dressing each time.  ACTIVITY Walk with your Strough as instructed. Use Gemme as long as suggested by your caregivers. Avoid periods of inactivity such as sitting longer than an hour when not asleep. This helps prevent blood clots.  You may resume a sexual relationship in one month or when given the OK by your doctor.  You may return to work once you are cleared by your doctor.  Do not drive a car for 6 weeks or until released by you surgeon.  Do not drive while taking narcotics.  WEIGHT BEARING Weight bearing as tolerated with assist device (Dettman, cane, etc) as directed, use it as long as suggested by your surgeon or therapist, typically at least 4-6 weeks.  POSTOPERATIVE CONSTIPATION PROTOCOL Constipation - defined medically as fewer than three stools per week and severe constipation as less than one stool per week.  One of the most common issues patients have following surgery is constipation.  Even if you have a regular bowel pattern at home, your normal regimen is likely to be disrupted due to multiple reasons following surgery.  Combination of anesthesia, postoperative narcotics, change in appetite and fluid intake all can affect your bowels.  In order to avoid  complications following surgery, here are some recommendations in order to help you during your recovery period.  Colace (docusate) - Pick up an over-the-counter form of Colace or another stool softener and take twice a day as long as you are requiring postoperative pain medications.  Take with a full glass of water daily.  If you experience loose stools or diarrhea, hold the colace until you stool forms back up.  If your symptoms do not get  better within 1 week or if they get worse, check with your doctor.  Dulcolax (bisacodyl) - Pick up over-the-counter and take as directed by the product packaging as needed to assist with the movement of your bowels.  Take with a full glass of water.  Use this product as needed if not relieved by Colace only.   MiraLax (polyethylene glycol) - Pick up over-the-counter to have on hand.  MiraLax is a solution that will increase the amount of water in your bowels to assist with bowel movements.  Take as directed and can mix with a glass of water, juice, soda, coffee, or tea.  Take if you go more than two days without a movement. Do not use MiraLax more than once per day. Call your doctor if you are still constipated or irregular after using this medication for 7 days in a row.  If you continue to have problems with postoperative constipation, please contact the office for further assistance and recommendations.  If you experience "the worst abdominal pain ever" or develop nausea or vomiting, please contact the office immediatly for further recommendations for treatment.  ITCHING  If you experience itching with your medications, try taking only a single pain pill, or even half a pain pill at a time.  You can also use Benadryl over the counter for itching or also to help with sleep.   TED HOSE STOCKINGS Wear the elastic stockings on both legs for three weeks following surgery during the day but you may remove then at night for sleeping.  MEDICATIONS See your medication summary on the After Visit Summary that the nursing staff will review with you prior to discharge.  You may have some home medications which will be placed on hold until you complete the course of blood thinner medication.  It is important for you to complete the blood thinner medication as prescribed by your surgeon.  Continue your approved medications as instructed at time of discharge.  Gabapentin 300 mg Protocol Take a 300 mg capsule  three times a day for two weeks, Then a 300 mg capsule twice a day for two weeks, Then a 300 mg capsule once a day for two weeks, then discontinue the Gabapentin.  PRECAUTIONS If you experience chest pain or shortness of breath - call 911 immediately for transfer to the hospital emergency department.  If you develop a fever greater that 101 F, purulent drainage from wound, increased redness or drainage from wound, foul odor from the wound/dressing, or calf pain - CONTACT YOUR SURGEON.                                                   FOLLOW-UP APPOINTMENTS Make sure you keep all of your appointments after your operation with your surgeon and caregivers. You should call the office at the above phone number and make an appointment for approximately two weeks after  the date of your surgery or on the date instructed by your surgeon outlined in the "After Visit Summary".   RANGE OF MOTION AND STRENGTHENING EXERCISES  Rehabilitation of the knee is important following a knee injury or an operation. After just a few days of immobilization, the muscles of the thigh which control the knee become weakened and shrink (atrophy). Knee exercises are designed to build up the tone and strength of the thigh muscles and to improve knee motion. Often times heat used for twenty to thirty minutes before working out will loosen up your tissues and help with improving the range of motion but do not use heat for the first two weeks following surgery. These exercises can be done on a training (exercise) mat, on the floor, on a table or on a bed. Use what ever works the best and is most comfortable for you Knee exercises include:  Leg Lifts - While your knee is still immobilized in a splint or cast, you can do straight leg raises. Lift the leg to 60 degrees, hold for 3 sec, and slowly lower the leg. Repeat 10-20 times 2-3 times daily. Perform this exercise against resistance later as your knee gets better.  Quad and Hamstring  Sets - Tighten up the muscle on the front of the thigh (Quad) and hold for 5-10 sec. Repeat this 10-20 times hourly. Hamstring sets are done by pushing the foot backward against an object and holding for 5-10 sec. Repeat as with quad sets.   Leg Slides: Lying on your back, slowly slide your foot toward your buttocks, bending your knee up off the floor (only go as far as is comfortable). Then slowly slide your foot back down until your leg is flat on the floor again.  Angel Wings: Lying on your back spread your legs to the side as far apart as you can without causing discomfort.  A rehabilitation program following serious knee injuries can speed recovery and prevent re-injury in the future due to weakened muscles. Contact your doctor or a physical therapist for more information on knee rehabilitation.   IF YOU ARE TRANSFERRED TO A SKILLED REHAB FACILITY If the patient is transferred to a skilled rehab facility following release from the hospital, a list of the current medications will be sent to the facility for the patient to continue.  When discharged from the skilled rehab facility, please have the facility set up the patient's Gregory prior to being released. Also, the skilled facility will be responsible for providing the patient with their medications at time of release from the facility to include their pain medication, the muscle relaxants, and their blood thinner medication. If the patient is still at the rehab facility at time of the two week follow up appointment, the skilled rehab facility will also need to assist the patient in arranging follow up appointment in our office and any transportation needs.  MAKE SURE YOU:  Understand these instructions.  Get help right away if you are not doing well or get worse.    Pick up stool softner and laxative for home use following surgery while on pain medications. Do not submerge incision under water. Please use good hand  washing techniques while changing dressing each day. May shower starting three days after surgery. Please use a clean towel to pat the incision dry following showers. Continue to use ice for pain and swelling after surgery. Do not use any lotions or creams on the incision until instructed by  your surgeon.   Information on my medicine - XARELTO (Rivaroxaban)  Why was Xarelto prescribed for you? Xarelto was prescribed for you to reduce the risk of blood clots forming after orthopedic surgery. The medical term for these abnormal blood clots is venous thromboembolism (VTE).  What do you need to know about xarelto ? Take your Xarelto ONCE DAILY at the same time every day. You may take it either with or without food.  If you have difficulty swallowing the tablet whole, you may crush it and mix in applesauce just prior to taking your dose.  Take Xarelto exactly as prescribed by your doctor and DO NOT stop taking Xarelto without talking to the doctor who prescribed the medication.  Stopping without other VTE prevention medication to take the place of Xarelto may increase your risk of developing a clot.  After discharge, you should have regular check-up appointments with your healthcare provider that is prescribing your Xarelto.    What do you do if you miss a dose? If you miss a dose, take it as soon as you remember on the same day then continue your regularly scheduled once daily regimen the next day. Do not take two doses of Xarelto on the same day.   Important Safety Information A possible side effect of Xarelto is bleeding. You should call your healthcare provider right away if you experience any of the following: ? Bleeding from an injury or your nose that does not stop. ? Unusual colored urine (red or dark brown) or unusual colored stools (red or black). ? Unusual bruising for unknown reasons. ? A serious fall or if you hit your head (even if there is no bleeding).  Some  medicines may interact with Xarelto and might increase your risk of bleeding while on Xarelto. To help avoid this, consult your healthcare provider or pharmacist prior to using any new prescription or non-prescription medications, including herbals, vitamins, non-steroidal anti-inflammatory drugs (NSAIDs) and supplements.  This website has more information on Xarelto: [email or web address]

## 2019-08-19 NOTE — Anesthesia Postprocedure Evaluation (Signed)
Anesthesia Post Note  Patient: Micheal Dominguez  Procedure(s) Performed: TOTAL KNEE ARTHROPLASTY (Right Knee)     Patient location during evaluation: PACU Anesthesia Type: Spinal Level of consciousness: awake Pain management: pain level controlled Vital Signs Assessment: post-procedure vital signs reviewed and stable Respiratory status: spontaneous breathing Cardiovascular status: stable Postop Assessment: no apparent nausea or vomiting Anesthetic complications: no    Last Vitals:  Vitals:   08/19/19 1417 08/19/19 1524  BP: 132/87 136/87  Pulse: 71 70  Resp: 17 17  Temp: 36.9 C 36.4 C  SpO2: 98% 99%    Last Pain:  Vitals:   08/19/19 1524  TempSrc: Oral  PainSc:                  Lashuna Tamashiro

## 2019-08-19 NOTE — Progress Notes (Signed)
PHARMACIST - PHYSICIAN ORDER COMMUNICATION  CONCERNING: P&T Medication Policy on Herbal Medications  DESCRIPTION:  This patient's order for:  Krill Oil caps  has been noted.  This product(s) is classified as an "herbal" or natural product. Due to a lack of definitive safety studies or FDA approval, nonstandard manufacturing practices, plus the potential risk of unknown drug-drug interactions while on inpatient medications, the Pharmacy and Therapeutics Committee does not permit the use of "herbal" or natural products of this type within [facility].   ACTION TAKEN: The pharmacy department is unable to verify this order at this time and your patient has been informed of this safety policy. Please reevaluate patient's clinical condition at discharge and address if the herbal or natural product(s) should be resumed at that time.   Leone Haven, PharmD

## 2019-08-19 NOTE — Transfer of Care (Signed)
Immediate Anesthesia Transfer of Care Note  Patient: Micheal Dominguez  Procedure(s) Performed: TOTAL KNEE ARTHROPLASTY (Right Knee)  Patient Location: PACU  Anesthesia Type:MAC combined with regional for post-op pain  Level of Consciousness: awake, alert , oriented and patient cooperative  Airway & Oxygen Therapy: Patient Spontanous Breathing and Patient connected to face mask oxygen  Post-op Assessment: Report given to RN, Post -op Vital signs reviewed and stable and Patient moving all extremities  Post vital signs: Reviewed and stable  Last Vitals:  Vitals Value Taken Time  BP 116/77 08/19/19 1046  Temp    Pulse 58 08/19/19 1047  Resp 10 08/19/19 1047  SpO2 100 % 08/19/19 1047  Vitals shown include unvalidated device data.  Last Pain:  Vitals:   08/19/19 0647  TempSrc: Oral  PainSc: 3          Complications: No apparent anesthesia complications

## 2019-08-19 NOTE — Interval H&P Note (Signed)
History and Physical Interval Note:  08/19/2019 8:01 AM  Micheal Dominguez  has presented today for surgery, with the diagnosis of right knee osteoarthritis.  The various methods of treatment have been discussed with the patient and family. After consideration of risks, benefits and other options for treatment, the patient has consented to  Procedure(s) with comments: TOTAL KNEE ARTHROPLASTY (Right) - 126min as a surgical intervention.  The patient's history has been reviewed, patient examined, no change in status, stable for surgery.  I have reviewed the patient's chart and labs.  Questions were answered to the patient's satisfaction.     [name]

## 2019-08-19 NOTE — Op Note (Signed)
NAME: Micheal Dominguez, Micheal Dominguez MEDICAL RECORD [id number] ACCOUNT [id number] DATE OF BIRTH:August 18, 1966 FACILITY: [facility] LOCATION: [facility] PHYSICIAN:Ajanae Virag Sanjuana Kava, MD  OPERATIVE REPORT  DATE OF PROCEDURE:  08/19/2019  SURGEON:  Ranee Gosselin, MD  ASSISTANT:  Dimitri Ped, PA-C  PREOPERATIVE DIAGNOSES: 1.  Primary osteoarthritis, right knee, with bone on bone. 2.  Flexion contractures, right knee.  POSTOPERATIVE DIAGNOSES: 1.  Primary osteoarthritis, right knee, with bone on bone. 2.  Flexion contractures, right knee.  OPERATION:  Right total knee arthroplasty utilizing the DePuy system.  The sizes used were a size 4 right femoral component and posterior cruciate sacrificing type.  The tray was a size 5.  The insert was a size 4, 17 mm thickness.  Patella was a size  41.  DESCRIPTION OF PROCEDURE:  Under spinal anesthesia and an adductor block of the right lower extremity, a routine orthopedic prep and draping of the right lower extremity was carried out.  Appropriate timeout was carried out as well.  I also marked the  appropriate right leg in the holding area.  After the prep and draping and timeout, the tourniquet was elevated to 325 mmHg after we exsanguinated the extremity with the Esmarch.  At this time, the patient had 1.5 g of IV vancomycin.  His knee then was  placed in the [facility] knee holder and flexed.  An anterior approach to the knee was carried out in the usual fashion.  Two flaps were created.  I then carried out a median parapatellar incision.  The patella was then reflected laterally, and we did  medial and lateral meniscectomies as well.  We also excised the anterior and posterior cruciate ligaments.  Once this was done, we then made our initial drill hole in the intercondylar notch, a guide rod was inserted, and at this particular time, we  removed 12 mm thickness off the distal femur because of the extreme tightness of the knee.  I also did a nice medial release prior  to this.  At this time, we then measured the distal femur to be a size 4 right.  We applied the appropriate guide and then  made our appropriate anterior, posterior chamfering cuts.  Once this was completed, attention was directed to the tibia.  We removed 4 mm thickness off the entire plateau, utilizing the medial side as our guide.  Note he had rather significant arthritic  changes.  We had to remove multiple spurs from the side of the tibia as well.  After the cut was made, we then made drill holes in the posterior aspect of the medial side of the tibia because of the hardness of the bone.  Following that, we then inserted  our lamina spreaders.  There was no posterior spurs.  We had nice cuts performed.  Following that, we then went on and inserted our spacer blocks and selected a size 17.  We then thoroughly irrigated out the knee and then prepared a tibial plateau in  the usual fashion for our keel cut.  We then went on and did our distal notch cut of the distal femur.  Once this was completed, we inserted our trial components, and through range of motion, had excellent stability and excellent fit with the  appropriate-sized instruments.  We then did a resurfacing procedure on the patella.  Three drill holes were made in the patella.  The patella preparation was for a size 41 patella.  We then tried our patellar button.  It fit quite nicely.  We then  removed all trial components, waterpicked out the knee, dried the knee out, and cemented all 3 components in simultaneously.  After the cement was hardened, we removed all loose pieces of cement.  I then waterpicked the knee several times to remove any  further loose pieces of cement.  I then inserted some thrombin Gelfoam into the posterior aspect of the knee.  We then inserted our permanent size 4, 17 mm thickness tibial insert, rotating platform type.  We reduced the knee and had excellent motion,  good stability as well.  I then inserted a Hemovac  drain, closed the wound in layers in the usual fashion.  Sterile dressings were applied.  We injected 20 mL of Exparel at the end of the procedure prior to closing the wound.  He will be admitted  overnight here for a few nights for pain control.  LN/NUANCE  D:08/19/2019 T:08/19/2019 JOB:[id number]

## 2019-08-19 NOTE — Brief Op Note (Signed)
08/19/2019  10:33 AM  PATIENT:  Micheal Dominguez  53 y.o. male  PRE-OPERATIVE DIAGNOSIS:  right knee Primary osteoarthritis with flexion Contractures  POST-OPERATIVE DIAGNOSIS:  right knee Primary  Osteoarthritis with Flexion Contractures.  PROCEDURE:  Procedure(s) with comments: TOTAL KNEE ARTHROPLASTY (Right) - 19min with release of contractures.  SURGEON:  Surgeon(s) and Role:    * [name], MD - Primary  PHYSICIAN ASSISTANT: [name] PA  ASSISTANTS: [name] PA  ANESTHESIA:   spinal and Adductor Block  EBL:  25 mL   BLOOD ADMINISTERED:none  DRAINS: (Open) Hemovact drain(s) in the Left Knee with  Suction Open   LOCAL MEDICATIONS USED:  OTHER 20cc of Exparel  SPECIMEN:  No Specimen  DISPOSITION OF SPECIMEN:  N/A  COUNTS:  YES  TOURNIQUET:  * Missing tourniquet times found for documented tourniquets in log: [id number] *  DICTATION: .Other Dictation: Dictation Number [id number]  PLAN OF CARE: Admit to inpatient   PATIENT DISPOSITION:  PACU - hemodynamically stable.   Delay start of Pharmacological VTE agent (>24hrs) due to surgical blood loss or risk of bleeding: yes

## 2019-08-20 ENCOUNTER — Encounter (HOSPITAL_COMMUNITY): Payer: Self-pay | Admitting: Orthopedic Surgery

## 2019-08-20 LAB — BASIC METABOLIC PANEL
Anion gap: 9 (ref 5–15)
BUN: 14 mg/dL (ref 6–20)
CO2: 21 mmol/L — ABNORMAL LOW (ref 22–32)
Calcium: 8.3 mg/dL — ABNORMAL LOW (ref 8.9–10.3)
Chloride: 103 mmol/L (ref 98–111)
Creatinine, Ser: 0.75 mg/dL (ref 0.61–1.24)
GFR calc Af Amer: >60 mL/min (ref >60)
GFR calc non Af Amer: >60 mL/min (ref >60)
Glucose, Bld: 155 mg/dL — ABNORMAL HIGH (ref 70–99)
Potassium: 4.2 mmol/L (ref 3.5–5.1)
Sodium: 133 mmol/L — ABNORMAL LOW (ref 135–145)

## 2019-08-20 LAB — CBC
HCT: 35.4 % — ABNORMAL LOW (ref 39.0–52.0)
Hemoglobin: 11.5 g/dL — ABNORMAL LOW (ref 13.0–17.0)
MCH: 29.3 pg (ref 26.0–34.0)
MCHC: 32.5 g/dL (ref 30.0–36.0)
MCV: 90.3 fL (ref 80.0–100.0)
Platelets: 279 10*3/uL (ref 150–400)
RBC: 3.92 MIL/uL — ABNORMAL LOW (ref 4.22–5.81)
RDW: 12.4 % (ref 11.5–15.5)
WBC: 15.9 10*3/uL — ABNORMAL HIGH (ref 4.0–10.5)
nRBC: 0 % (ref 0.0–0.2)

## 2019-08-20 MED ORDER — HYDROMORPHONE HCL 2 MG PO TABS
4.0000 mg | ORAL_TABLET | ORAL | Status: DC | PRN
  Administered 2019-08-20 – 2019-08-24 (×20): 4 mg via ORAL
  Filled 2019-08-20 (×20): qty 2

## 2019-08-20 MED ORDER — DIPHENHYDRAMINE HCL 25 MG PO CAPS
25.0000 mg | ORAL_CAPSULE | Freq: Four times a day (QID) | ORAL | Status: DC | PRN
  Administered 2019-08-20 – 2019-08-22 (×2): 25 mg via ORAL
  Filled 2019-08-20 (×2): qty 1

## 2019-08-20 NOTE — Progress Notes (Signed)
Physical Therapy Treatment Patient Details Name: Micheal Dominguez MRN: [id number] DOB: 07-Sep-1966 Today's Date: 08/20/2019    History of Present Illness Patient is 53 y.o. male s/p Rt TKA with PMH significant for HTN and jaw cancer.    PT Comments    Pt motivated and progressing well with mobility.   Follow Up Recommendations  Follow surgeon's recommendation for DC plan and follow-up therapies     Equipment Recommendations  None recommended by PT    Recommendations for Other Services       Precautions / Restrictions Precautions Precautions: Fall Required Braces or Orthoses: Knee Immobilizer - Right Knee Immobilizer - Right: Discontinue once straight leg raise with < 10 degree lag Restrictions Weight Bearing Restrictions: No Other Position/Activity Restrictions: WBAT    Mobility  Bed Mobility Overal bed mobility: Needs Assistance Bed Mobility: Supine to Sit     Supine to sit: Min assist;HOB elevated     General bed mobility comments: cues for sequence and use of L LE to self assist  Transfers Overall transfer level: Needs assistance Equipment used: Rolling Micheal (2 wheeled) Transfers: Sit to/from Stand Sit to Stand: Min guard         General transfer comment: cues for LE management and use of UEs to self assist  Ambulation/Gait Ambulation/Gait assistance: Min guard Gait Distance (Feet): 130 Feet Assistive device: Rolling Micheal (2 wheeled) Gait Pattern/deviations: Step-to pattern;Decreased step length - right;Decreased step length - left;Shuffle;Antalgic;Trunk flexed Gait velocity: decreased   General Gait Details: cues for sequence, posture and position from [employer] Mobility    Modified Rankin (Stroke Patients Only)       Balance Overall balance assessment: Needs assistance Sitting-balance support: No upper extremity supported;Feet supported Sitting balance-Leahy Scale: Good     Standing balance support:  During functional activity;Bilateral upper extremity supported Standing balance-Leahy Scale: Fair                              Cognition Arousal/Alertness: Awake/alert Behavior During Therapy: WFL for tasks assessed/performed Overall Cognitive Status: Within Functional Limits for tasks assessed                                        Exercises Total Joint Exercises Ankle Circles/Pumps: AROM;Both;Supine;15 reps Quad Sets: AROM;10 reps;Supine;Both Heel Slides: AAROM;Right;15 reps;Supine Straight Leg Raises: AAROM;Right;10 reps;Supine    General Comments        Pertinent Vitals/Pain Pain Assessment: 0-10 Pain Score: 6  Pain Location: Rt knee Pain Descriptors / Indicators: Aching;Sore;Discomfort Pain Intervention(s): Limited activity within patient's tolerance;Monitored during session;Premedicated before session;Ice applied    Home Living                      Prior Function            PT Goals (current goals can now be found in the care plan section) Acute Rehab PT Goals Patient Stated Goal: to gain back full ROM of knee PT Goal Formulation: With patient Time For Goal Achievement: 08/26/19 Potential to Achieve Goals: Good Progress towards PT goals: Progressing toward goals    Frequency    7X/week      PT Plan Current plan remains appropriate    Co-evaluation  AM-PAC PT "6 Clicks" Mobility   Outcome Measure  Help needed turning from your back to your side while in a flat bed without using bedrails?: A Little Help needed moving from lying on your back to sitting on the side of a flat bed without using bedrails?: A Little Help needed moving to and from a bed to a chair (including a wheelchair)?: A Little Help needed standing up from a chair using your arms (e.g., wheelchair or bedside chair)?: A Little Help needed to walk in hospital room?: A Little Help needed climbing 3-5 steps with a railing? : A  Little 6 Click Score: 18    End of Session Equipment Utilized During Treatment: Gait belt;Right knee immobilizer Activity Tolerance: Patient tolerated treatment well Patient left: in chair;with call bell/phone within reach;with chair alarm set Nurse Communication: Mobility status PT Visit Diagnosis: Difficulty in walking, not elsewhere classified (R26.2);Muscle weakness (generalized) (M62.81)     Time: 4098-1191 PT Time Calculation (min) (ACUTE ONLY): 38 min  Charges:  $Gait Training: 8-22 mins $Therapeutic Exercise: 8-22 mins $Therapeutic Activity: 8-22 mins                     Mauro Kaufmann PT Acute Rehabilitation Services Pager [phone] Office [phone]    Ma Munoz 08/20/2019, 12:47 PM

## 2019-08-20 NOTE — Progress Notes (Signed)
Subjective: 1 Day Post-Op Procedure(s) (LRB): TOTAL KNEE ARTHROPLASTY (Right) Patient reports pain as 4 on 0-10 scale.    Objective: Vital signs in last 24 hours: Temp:  [97.6 F (36.4 C)-98.5 F (36.9 C)] 98.3 F (36.8 C) (10/15 0524) Pulse Rate:  [47-80] 80 (10/15 0524) Resp:  [11-19] 19 (10/15 0524) BP: (113-150)/(66-97) 150/88 (10/15 0524) SpO2:  [97 %-100 %] 99 % (10/15 0524) Weight:  [109.8 kg] 109.8 kg (10/14 1221)  Intake/Output from previous day: 10/14 0701 - 10/15 0700 In: 5350.1 [P.O.:1360; I.V.:3540.1; IV Piggyback:450] Out: [id number] [Urine:1750; Drains:60; Blood:25] Intake/Output this shift: No intake/output data recorded.  Recent Labs    08/20/19 0242  HGB 11.5*   Recent Labs    08/20/19 0242  WBC 15.9*  RBC 3.92*  HCT 35.4*  PLT 279   Recent Labs    08/20/19 0242  NA 133*  K 4.2  CL 103  CO2 21*  BUN 14  CREATININE 0.75  GLUCOSE 155*  CALCIUM 8.3*   No results for input(s): LABPT, INR in the last 72 hours.  Neurologically intact Dorsiflexion/Plantar flexion intact No cellulitis present   Assessment/Plan: 1 Day Post-Op Procedure(s) (LRB): TOTAL KNEE ARTHROPLASTY (Right) Up with therapy Plan   on DC tomorrow.   Anticipated LOS equal to or greater than 2 midnights due to - Age 55 and older with one or more of the following:  - Obesity  - Expected need for hospital services (PT, OT, Nursing) required for safe  discharge  - Anticipated need for postoperative skilled nursing care or inpatient rehab  - Active co-morbidities: None OR   - Unanticipated findings during/Post Surgery: None  - Patient is a high risk of re-admission due to: Non-elective hospital admission within previous 6 months    [name] 08/20/2019, 7:14 AM

## 2019-08-20 NOTE — Progress Notes (Signed)
Physical Therapy Treatment Patient Details Name: Micheal Dominguez MRN: [id number] DOB: 1966-06-19 Today's Date: 08/20/2019    History of Present Illness Patient is 53 y.o. male s/p Rt TKA with PMH significant for HTN and jaw cancer.    PT Comments    Pt motivated to progress but ltd this pm by pain.   Follow Up Recommendations  Follow surgeon's recommendation for DC plan and follow-up therapies     Equipment Recommendations  None recommended by PT    Recommendations for Other Services       Precautions / Restrictions Precautions Precautions: Fall Required Braces or Orthoses: Knee Immobilizer - Right Knee Immobilizer - Right: Discontinue once straight leg raise with < 10 degree lag Restrictions Weight Bearing Restrictions: No Other Position/Activity Restrictions: WBAT    Mobility  Bed Mobility Overal bed mobility: Needs Assistance Bed Mobility: Supine to Sit;Sit to Supine     Supine to sit: Min assist Sit to supine: Min assist   General bed mobility comments: cues for sequence and use of L LE to self assist  Transfers Overall transfer level: Needs assistance Equipment used: Rolling Bayliss (2 wheeled) Transfers: Sit to/from Stand Sit to Stand: Min guard         General transfer comment: cues for LE management and use of UEs to self assist  Ambulation/Gait Ambulation/Gait assistance: Min guard Gait Distance (Feet): 40 Feet Assistive device: Rolling Seder (2 wheeled) Gait Pattern/deviations: Step-to pattern;Decreased step length - right;Decreased step length - left;Shuffle;Antalgic;Trunk flexed Gait velocity: decreased   General Gait Details: cues for sequence, posture and position from RW; distance pain ltd   Stairs             Wheelchair Mobility    Modified Rankin (Stroke Patients Only)       Balance Overall balance assessment: Needs assistance Sitting-balance support: No upper extremity supported;Feet supported Sitting balance-Leahy  Scale: Good     Standing balance support: During functional activity;Bilateral upper extremity supported Standing balance-Leahy Scale: Fair                              Cognition Arousal/Alertness: Awake/alert Behavior During Therapy: WFL for tasks assessed/performed Overall Cognitive Status: Within Functional Limits for tasks assessed                                        Exercises Total Joint Exercises Ankle Circles/Pumps: AROM;Both;Supine;15 reps Quad Sets: AROM;10 reps;Supine;Both Heel Slides: AAROM;Right;15 reps;Supine Straight Leg Raises: AAROM;Right;10 reps;Supine    General Comments        Pertinent Vitals/Pain Pain Assessment: 0-10 Pain Score: 8  Pain Location: Rt knee Pain Descriptors / Indicators: Aching;Sore;Discomfort Pain Intervention(s): Limited activity within patient's tolerance;Monitored during session;Premedicated before session;Ice applied;Patient requesting pain meds-RN notified    Home Living                      Prior Function            PT Goals (current goals can now be found in the care plan section) Acute Rehab PT Goals Patient Stated Goal: to gain back full ROM of knee PT Goal Formulation: With patient Time For Goal Achievement: 08/26/19 Potential to Achieve Goals: Good Progress towards PT goals: Progressing toward goals    Frequency    7X/week      PT Plan Current  plan remains appropriate    Co-evaluation              AM-PAC PT "6 Clicks" Mobility   Outcome Measure  Help needed turning from your back to your side while in a flat bed without using bedrails?: A Little Help needed moving from lying on your back to sitting on the side of a flat bed without using bedrails?: A Little Help needed moving to and from a bed to a chair (including a wheelchair)?: A Little Help needed standing up from a chair using your arms (e.g., wheelchair or bedside chair)?: A Little Help needed to walk in  hospital room?: A Little Help needed climbing 3-5 steps with a railing? : A Little 6 Click Score: 18    End of Session Equipment Utilized During Treatment: Gait belt;Right knee immobilizer Activity Tolerance: Patient tolerated treatment well;Patient limited by pain Patient left: in bed;with call bell/phone within reach;with family/visitor present Nurse Communication: Mobility status PT Visit Diagnosis: Difficulty in walking, not elsewhere classified (R26.2);Muscle weakness (generalized) (M62.81)     Time: 0272-5366 PT Time Calculation (min) (ACUTE ONLY): 30 min  Charges:  $Gait Training: 8-22 mins $Therapeutic Exercise: 8-22 mins                     Micheal Dominguez PT Acute Rehabilitation Services Pager [phone] Office [phone]    Rusti Arizmendi 08/20/2019, 4:49 PM

## 2019-08-21 ENCOUNTER — Inpatient Hospital Stay (HOSPITAL_COMMUNITY): Payer: BC Managed Care – PPO

## 2019-08-21 DIAGNOSIS — M79609 Pain in unspecified limb: Secondary | ICD-10-CM

## 2019-08-21 DIAGNOSIS — M7989 Other specified soft tissue disorders: Secondary | ICD-10-CM

## 2019-08-21 LAB — CBC
HCT: 31.1 % — ABNORMAL LOW (ref 39.0–52.0)
Hemoglobin: 10.1 g/dL — ABNORMAL LOW (ref 13.0–17.0)
MCH: 29.1 pg (ref 26.0–34.0)
MCHC: 32.5 g/dL (ref 30.0–36.0)
MCV: 89.6 fL (ref 80.0–100.0)
Platelets: 261 10*3/uL (ref 150–400)
RBC: 3.47 MIL/uL — ABNORMAL LOW (ref 4.22–5.81)
RDW: 12.6 % (ref 11.5–15.5)
WBC: 14.4 10*3/uL — ABNORMAL HIGH (ref 4.0–10.5)
nRBC: 0 % (ref 0.0–0.2)

## 2019-08-21 LAB — BASIC METABOLIC PANEL
Anion gap: 7 (ref 5–15)
Anion gap: 11 (ref 5–15)
BUN: 11 mg/dL (ref 6–20)
BUN: 11 mg/dL (ref 6–20)
CO2: 24 mmol/L (ref 22–32)
CO2: 24 mmol/L (ref 22–32)
Calcium: 8.1 mg/dL — ABNORMAL LOW (ref 8.9–10.3)
Calcium: 8.3 mg/dL — ABNORMAL LOW (ref 8.9–10.3)
Chloride: 97 mmol/L — ABNORMAL LOW (ref 98–111)
Chloride: 95 mmol/L — ABNORMAL LOW (ref 98–111)
Creatinine, Ser: 0.79 mg/dL (ref 0.61–1.24)
Creatinine, Ser: 0.8 mg/dL (ref 0.61–1.24)
GFR calc Af Amer: >60 mL/min (ref >60)
GFR calc Af Amer: >60 mL/min (ref >60)
GFR calc non Af Amer: >60 mL/min (ref >60)
GFR calc non Af Amer: >60 mL/min (ref >60)
Glucose, Bld: 150 mg/dL — ABNORMAL HIGH (ref 70–99)
Glucose, Bld: 116 mg/dL — ABNORMAL HIGH (ref 70–99)
Potassium: 3.8 mmol/L (ref 3.5–5.1)
Potassium: 3.6 mmol/L (ref 3.5–5.1)
Sodium: 128 mmol/L — ABNORMAL LOW (ref 135–145)
Sodium: 130 mmol/L — ABNORMAL LOW (ref 135–145)

## 2019-08-21 MED ORDER — RIVAROXABAN 10 MG PO TABS: 10.0000 mg | ORAL_TABLET | Freq: Every day | ORAL | 0 refills | Status: DC

## 2019-08-21 MED ORDER — CYCLOBENZAPRINE HCL 10 MG PO TABS
10.0000 mg | ORAL_TABLET | Freq: Three times a day (TID) | ORAL | Status: DC
  Administered 2019-08-21 – 2019-08-24 (×10): 10 mg via ORAL
  Filled 2019-08-21 (×12): qty 1

## 2019-08-21 MED ORDER — MORPHINE SULFATE (PF) 2 MG/ML IV SOLN
1.0000 mg | INTRAVENOUS | Status: DC | PRN
  Administered 2019-08-21 – 2019-08-22 (×2): 1 mg via INTRAVENOUS
  Filled 2019-08-21 (×2): qty 1

## 2019-08-21 MED ORDER — KETOROLAC TROMETHAMINE 15 MG/ML IJ SOLN
7.5000 mg | Freq: Four times a day (QID) | INTRAMUSCULAR | Status: AC
  Administered 2019-08-21 – 2019-08-22 (×7): 7.5 mg via INTRAVENOUS
  Filled 2019-08-21 (×7): qty 1

## 2019-08-21 MED ORDER — CYCLOBENZAPRINE HCL 10 MG PO TABS: 10.0000 mg | ORAL_TABLET | Freq: Three times a day (TID) | ORAL | 0 refills | Status: DC

## 2019-08-21 MED ORDER — GABAPENTIN 300 MG PO CAPS: ORAL_CAPSULE | ORAL | 0 refills | Status: DC

## 2019-08-21 MED ORDER — GABAPENTIN 300 MG PO CAPS
300.0000 mg | ORAL_CAPSULE | Freq: Three times a day (TID) | ORAL | Status: DC
  Administered 2019-08-21 – 2019-08-24 (×10): 300 mg via ORAL
  Filled 2019-08-21 (×10): qty 1

## 2019-08-21 MED ORDER — HYDROMORPHONE HCL 4 MG PO TABS: 4.0000 mg | ORAL_TABLET | ORAL | 0 refills | Status: DC | PRN

## 2019-08-21 NOTE — Progress Notes (Signed)
Called by nurse regarding pain and swelling in the right lower extremity.  Patient is status post right total knee arthroplasty on 08/19/2019.  I was called right after 0100.  Prior to the phone call, the patient's pain level was 10 out of 10.  He received 1 mg of IV Dilaudid, and he states that at rest his pain has dropped to essentially 0.  He endorses intermittent paresthesias around the knee and in the toes, but not present currently  The patient is in no acute distress.  He is resting comfortably in bed.  His current diastolic blood pressure is 95 mmHg.  Examination of the right knee reveals that he has gauze dressing secured with paper tape.  There is no circumferential dressing.  He has scant dried serosanguineous drainage on his dressing.  He has significant swelling to the lower extremity.  He has tenderness to palpation of the lower extremity compartments, but they are certainly not tense.  The thigh is swollen but compressible.  He has 1+ palpable pedal pulses, which is symmetric to the contralateral side.  He currently reports intact sensation in the superficial peroneal, deep peroneal, and posterior tibial distributions, equivalent to the contralateral side.  He has painless active and passive range of motion of the toes.  He has mild increase in pain with passive dorsiflexion and plantar flexion.  Due to the swelling and equivocal exam findings, I obtained the [employer] compartment pressure monitor and obtained compartment pressures of the lower leg using sterile technique.  Findings are as follows:  Anterior: 25 mmHg Lateral: 35 mmHg Superficial posterior: 30 mmHg Deep posterior: 35 mmHg  These findings rule out compartment syndrome.  Recommend obtaining venous Doppler of right lower extremity to rule out DVT, which was ordered.  Recommend continue supportive care with ice and elevation.  Avoid circumferential dressing.  Continue to monitor the patient.  I will discuss the situation with  Dr. Gladstone Lighter in the morning.

## 2019-08-21 NOTE — Progress Notes (Signed)
Physical Therapy Treatment Patient Details Name: Micheal Dominguez MRN: [id number] DOB: [birth year] Today's Date: 08/21/2019    History of Present Illness Patient is 53 y.o. male s/p Rt TKA with PMH significant for HTN and jaw cancer.    PT Comments    Pt ltd this date by pain with PT deferred this am but pt willing to mobilize this pm.  Pt ambulated short distance in hall and then returned to bed with leg in elevated position.   Follow Up Recommendations  Follow surgeon's recommendation for DC plan and follow-up therapies     Equipment Recommendations  None recommended by PT    Recommendations for Other Services       Precautions / Restrictions Precautions Precautions: Fall Required Braces or Orthoses: Knee Immobilizer - Right Knee Immobilizer - Right: Discontinue once straight leg raise with < 10 degree lag Restrictions Weight Bearing Restrictions: No Other Position/Activity Restrictions: WBAT    Mobility  Bed Mobility Overal bed mobility: Needs Assistance Bed Mobility: Supine to Sit;Sit to Supine     Supine to sit: Min assist Sit to supine: Min assist   General bed mobility comments: cues for sequence and use of L LE to self assist  Transfers Overall transfer level: Needs assistance Equipment used: Rolling [place] (2 wheeled) Transfers: Sit to/from Stand Sit to Stand: Min assist;Min guard         General transfer comment: cues for LE management and use of UEs to self assist  Ambulation/Gait Ambulation/Gait assistance: Min assist;Min guard Gait Distance (Feet): 44 Feet Assistive device: Rolling [place] (2 wheeled) Gait Pattern/deviations: Step-to pattern;Decreased step length - right;Decreased step length - left;Shuffle;Antalgic;Trunk flexed Gait velocity: decreased   General Gait Details: cues for sequence, posture and position from RW; distance pain/fatigue ltd   Stairs             Wheelchair Mobility    Modified Rankin (Stroke Patients Only)       Balance Overall balance assessment: Needs assistance Sitting-balance support: No upper extremity supported;Feet supported Sitting balance-Leahy Scale: Good     Standing balance support: During functional activity;Bilateral upper extremity supported Standing balance-Leahy Scale: Fair                              Cognition Arousal/Alertness: Awake/alert Behavior During Therapy: WFL for tasks assessed/performed Overall Cognitive Status: Within Functional Limits for tasks assessed                                        Exercises Total Joint Exercises Ankle Circles/Pumps: AROM;Both;Supine;15 reps    General Comments        Pertinent Vitals/Pain Pain Assessment: 0-10 Pain Score: 6  Pain Location: Rt knee Pain Descriptors / Indicators: Aching;Sore Pain Intervention(s): Limited activity within patient's tolerance;Monitored during session;Premedicated before session;Ice applied    Home Living                      Prior Function            PT Goals (current goals can now be found in the care plan section) Acute Rehab PT Goals Patient Stated Goal: to gain back full ROM of knee PT Goal Formulation: With patient Time For Goal Achievement: 08/26/19 Potential to Achieve Goals: Good Progress towards PT goals: Progressing toward goals    Frequency  7X/week      PT Plan Current plan remains appropriate    Co-evaluation              AM-PAC PT "6 Clicks" Mobility   Outcome Measure  Help needed turning from your back to your side while in a flat bed without using bedrails?: A Little Help needed moving from lying on your back to sitting on the side of a flat bed without using bedrails?: A Little Help needed moving to and from a bed to a chair (including a wheelchair)?: A Little Help needed standing up from a chair using your arms (e.g., wheelchair or bedside chair)?: A Little Help needed to walk in hospital room?: A  Little Help needed climbing 3-5 steps with a railing? : A Lot 6 Click Score: 17    End of Session Equipment Utilized During Treatment: Gait belt;Right knee immobilizer Activity Tolerance: Patient tolerated treatment well;Patient limited by pain Patient left: in bed;with call bell/phone within reach;with family/visitor present Nurse Communication: Mobility status PT Visit Diagnosis: Difficulty in walking, not elsewhere classified (R26.2);Muscle weakness (generalized) (M62.81)     Time: 1538-1600 PT Time Calculation (min) (ACUTE ONLY): 22 min  Charges:  $Gait Training: 8-22 mins                     Micheal Dominguez PT Acute Rehabilitation Services Pager [phone] Office [phone]    Micheal Dominguez 08/21/2019, 4:21 PM

## 2019-08-21 NOTE — Progress Notes (Signed)
Right lower extremity venous duplex completed. Refer to "CV Proc" under chart review to view preliminary results.  08/21/2019 9:24 AM Maudry Mayhew, MHA, RVT, RDCS, RDMS

## 2019-08-21 NOTE — Plan of Care (Signed)
  Problem: Education: Goal: Knowledge of the prescribed therapeutic regimen will improve Outcome: Progressing   Problem: Education: Goal: Knowledge of General Education information will improve Description: Including pain rating scale, medication(s)/side effects and non-pharmacologic comfort measures Outcome: Progressing   Problem: Clinical Measurements: Goal: Respiratory complications will improve Outcome: Progressing   Problem: Nutrition: Goal: Adequate nutrition will be maintained Outcome: Progressing   Problem: Elimination: Goal: Will not experience complications related to urinary retention Outcome: Progressing

## 2019-08-21 NOTE — Progress Notes (Addendum)
   Subjective: 2 Days Post-Op Procedure(s) (LRB): TOTAL KNEE ARTHROPLASTY (Right) Patient reports pain as moderate.   Patient seen in rounds for Dr. Gladstone Lighter. Patient is having problems with significant pain and swelling in the right knee and lower leg. He reports that it is a little better since last night but he is still having pain. He reports that the numbness has improved that he was experiencing last night. He feels like the swelling has decreased a little. He denies SOB anc chest pain. Voiding well. Positive flatus.  Plan is to go Home after hospital stay.  Objective: Vital signs in last 24 hours: Temp:  [97.5 F (36.4 C)-99.1 F (37.3 C)] 98.7 F (37.1 C) (10/16 0523) Pulse Rate:  [89-101] 100 (10/16 0523) Resp:  [18-19] 19 (10/16 0523) BP: (122-159)/(80-95) 122/82 (10/16 0523) SpO2:  [95 %-99 %] 95 % (10/16 0523)  Intake/Output from previous day:  Intake/Output Summary (Last 24 hours) at 08/21/2019 0738 Last data filed at 08/21/2019 0624 Gross per 24 hour  Intake 490 ml  Output 1825 ml  Net -1335 ml     Labs: Recent Labs    08/20/19 0242 08/21/19 0311  HGB 11.5* 10.1*   Recent Labs    08/20/19 0242 08/21/19 0311  WBC 15.9* 14.4*  RBC 3.92* 3.47*  HCT 35.4* 31.1*  PLT 279 261   Recent Labs    08/20/19 0242 08/21/19 0311  NA 133* 128*  K 4.2 3.8  CL 103 97*  CO2 21* 24  BUN 14 11  CREATININE 0.75 0.79  GLUCOSE 155* 150*  CALCIUM 8.3* 8.1*    EXAM General - Patient is Alert and Oriented Extremity - Neurologically intact Sensation intact distally  Intact pulses distally  Dorsiflexion/Plantar flexion intact Incision: scant bloody drainage from the mid portion of the incision Compartment is soft but calf is tender to palpation Mild swelling in the right thigh with some tenderness to palpation Motor Function - intact, moving foot and toes well on exam but with some mild increase in discomfort in the calf with movement  Past Medical History:   Diagnosis Date  . Cancer ([place])    jaw  . Hypertension   . RBBB    Hx - no problems    Assessment/Plan: 2 Days Post-Op Procedure(s) (LRB): TOTAL KNEE ARTHROPLASTY (Right) Active Problems:   Total knee replacement status, right  Estimated body mass index is 37.9 kg/m as calculated from the following:   Height as of this encounter: 5\' 7"  (1.702 m).   Weight as of this encounter: 109.8 kg. Advance diet D/C IV fluids  DVT Prophylaxis - Xarelto Weight-Bearing as tolerated   Discussed patient with Dr. Lyla Glassing who has some concerns about the severity of the swelling. Findings last night were negative for compartment syndrome but there is some risk moving forward he feels. Will get doppler to rule out DVT. Will hold IV fluids. Hold HCTZ. Appears to be fluid overloaded causing hyponatremia. Will recheck BMP this afternoon. Dr. Alvan Dame saw the patient as well this morning who was not concerned with compartment syndrome. Will switch to Flexeril. Stay on Dilaudid po and will change to morphine IV but prefer to limit IV meds. Will add Toradol for the next couple days as well. Will DC home when ready but may need to stay through the weekend pending improvement. Up with therapy as tolerated.    [name], PA-C Orthopaedic Surgery 08/21/2019, 7:38 AM

## 2019-08-21 NOTE — Progress Notes (Signed)
Pt stated his R leg is beginning to feel numb, toes tingling. R leg more swollen and tight than RN's 1st assessment tonight. Pt pain level has been at level 8-9 receiving dilaudid about every 2hr with only slight relief. CN consulted. On call physician paged.   Dr. Lyla Glassing called RN. RN told Dr. Lyla Glassing the above information. Dr. Lyla Glassing will assess the pt.

## 2019-08-22 LAB — BASIC METABOLIC PANEL
Anion gap: 13 (ref 5–15)
BUN: 19 mg/dL (ref 6–20)
CO2: 21 mmol/L — ABNORMAL LOW (ref 22–32)
Calcium: 8.5 mg/dL — ABNORMAL LOW (ref 8.9–10.3)
Chloride: 103 mmol/L (ref 98–111)
Creatinine, Ser: 1.11 mg/dL (ref 0.61–1.24)
GFR calc Af Amer: >60 mL/min (ref >60)
GFR calc non Af Amer: >60 mL/min (ref >60)
Glucose, Bld: 181 mg/dL — ABNORMAL HIGH (ref 70–99)
Potassium: 5 mmol/L (ref 3.5–5.1)
Sodium: 137 mmol/L (ref 135–145)

## 2019-08-22 LAB — CBC
HCT: 28.3 % — ABNORMAL LOW (ref 39.0–52.0)
HCT: 27.8 % — ABNORMAL LOW (ref 39.0–52.0)
Hemoglobin: 9.3 g/dL — ABNORMAL LOW (ref 13.0–17.0)
Hemoglobin: 9.3 g/dL — ABNORMAL LOW (ref 13.0–17.0)
MCH: 29.6 pg (ref 26.0–34.0)
MCH: 29.6 pg (ref 26.0–34.0)
MCHC: 32.9 g/dL (ref 30.0–36.0)
MCHC: 33.5 g/dL (ref 30.0–36.0)
MCV: 90.1 fL (ref 80.0–100.0)
MCV: 88.5 fL (ref 80.0–100.0)
Platelets: 237 10*3/uL (ref 150–400)
Platelets: 245 10*3/uL (ref 150–400)
RBC: 3.14 MIL/uL — ABNORMAL LOW (ref 4.22–5.81)
RBC: 3.14 MIL/uL — ABNORMAL LOW (ref 4.22–5.81)
RDW: 12.5 % (ref 11.5–15.5)
RDW: 12.6 % (ref 11.5–15.5)
WBC: 10.7 10*3/uL — ABNORMAL HIGH (ref 4.0–10.5)
WBC: 11 10*3/uL — ABNORMAL HIGH (ref 4.0–10.5)
nRBC: 0 % (ref 0.0–0.2)
nRBC: 0 % (ref 0.0–0.2)

## 2019-08-22 NOTE — Progress Notes (Signed)
    Subjective:  Patient reports pain as moderate.  Denies N/V/CP/SOB. Feeling better.  Objective:   VITALS:   Vitals:   08/21/19 2324 08/22/19 0015 08/22/19 0531 08/22/19 0940  BP:   136/78 (!) 107/58  Pulse: (!) 102 90 98 (!) 106  Resp:   18 14  Temp: (!) 100.4 F (38 C) 99.9 F (37.7 C) 98.3 F (36.8 C) 98.3 F (36.8 C)  TempSrc: Oral  Oral Oral  SpO2:   95% 94%  Weight:      Height:        NAD ABD soft Sensation intact distally Intact pulses distally Dorsiflexion/Plantar flexion intact Incision: scant drainage Compartment soft Swelling improved  Lab Results  Component Value Date   WBC 10.7 (H) 08/22/2019   HGB 9.3 (L) 08/22/2019   HCT 28.3 (L) 08/22/2019   MCV 90.1 08/22/2019   PLT 237 08/22/2019   BMET    Component Value Date/Time   NA 130 (L) 08/21/2019 1342   K 3.6 08/21/2019 1342   CL 95 (L) 08/21/2019 1342   CO2 24 08/21/2019 1342   GLUCOSE 116 (H) 08/21/2019 1342   BUN 11 08/21/2019 1342   CREATININE 0.80 08/21/2019 1342   CALCIUM 8.3 (L) 08/21/2019 1342   GFRNONAA >60 08/21/2019 1342   GFRAA >60 08/21/2019 1342     Assessment/Plan: 3 Days Post-Op   Active Problems:   Total knee replacement status, right   WBAT with Strozier DVT ppx: Xarelto, SCDs, TEDS PO pain control PT/OT Fever: likely due to atelectasis, continue IS Dispo: OOB with PT, possible d/c home tomorrow   Hilton Cork Myangel Summons 08/22/2019, 9:45 AM   Rod Can, MD [phone] [place] is now [facility]  [place] [address], [place], [place], [place] [address] Phone: [phone] [email or web address] Facebook  [employer]

## 2019-08-22 NOTE — Progress Notes (Signed)
Physical Therapy Treatment Patient Details Name: Micheal Dominguez MRN: [id number] DOB: 02/10/1966 Today's Date: 08/22/2019    History of Present Illness Patient is 53 y.o. male s/p Rt TKA with PMH significant for HTN and jaw cancer.    PT Comments    Pt continues cooperative but limited by pain/fatigue.   Follow Up Recommendations  Follow surgeon's recommendation for DC plan and follow-up therapies     Equipment Recommendations  None recommended by PT    Recommendations for Other Services       Precautions / Restrictions Precautions Precautions: Fall Required Braces or Orthoses: Knee Immobilizer - Right Knee Immobilizer - Right: Discontinue once straight leg raise with < 10 degree lag Restrictions Weight Bearing Restrictions: No Other Position/Activity Restrictions: WBAT    Mobility  Bed Mobility Overal bed mobility: Needs Assistance Bed Mobility: Sit to Supine     Supine to sit: Min assist Sit to supine: Min assist   General bed mobility comments: cues for sequence and use of L LE to self assist  Transfers Overall transfer level: Needs assistance Equipment used: Rolling [place] (2 wheeled) Transfers: Sit to/from Stand Sit to Stand: Min assist         General transfer comment: cues for LE management and use of UEs to self assist  Ambulation/Gait Ambulation/Gait assistance: Min assist Gait Distance (Feet): 6 Feet Assistive device: Rolling [place] (2 wheeled) Gait Pattern/deviations: Step-to pattern;Decreased step length - right;Decreased step length - left;Shuffle;Antalgic;Trunk flexed Gait velocity: decreased   General Gait Details: cues for sequence, posture and position from RW; distance pain/fatigue ltd   Stairs             Wheelchair Mobility    Modified Rankin (Stroke Patients Only)       Balance Overall balance assessment: Needs assistance Sitting-balance support: No upper extremity supported;Feet supported Sitting balance-Leahy  Scale: Good     Standing balance support: During functional activity;Bilateral upper extremity supported Standing balance-Leahy Scale: Poor                              Cognition Arousal/Alertness: Awake/alert Behavior During Therapy: WFL for tasks assessed/performed Overall Cognitive Status: Within Functional Limits for tasks assessed                                        Exercises      General Comments        Pertinent Vitals/Pain Pain Assessment: 0-10 Pain Score: 7  Pain Location: Rt knee Pain Descriptors / Indicators: Aching;Sore Pain Intervention(s): Limited activity within patient's tolerance;Monitored during session;Premedicated before session;Ice applied    Home Living                      Prior Function            PT Goals (current goals can now be found in the care plan section) Acute Rehab PT Goals Patient Stated Goal: to gain back full ROM of knee PT Goal Formulation: With patient Time For Goal Achievement: 08/26/19 Potential to Achieve Goals: Good Progress towards PT goals: Not progressing toward goals - comment    Frequency    7X/week      PT Plan Current plan remains appropriate    Co-evaluation              AM-PAC PT "6 Clicks"  Mobility   Outcome Measure  Help needed turning from your back to your side while in a flat bed without using bedrails?: A Little Help needed moving from lying on your back to sitting on the side of a flat bed without using bedrails?: A Little Help needed moving to and from a bed to a chair (including a wheelchair)?: A Little Help needed standing up from a chair using your arms (e.g., wheelchair or bedside chair)?: A Little Help needed to walk in hospital room?: A Little Help needed climbing 3-5 steps with a railing? : A Lot 6 Click Score: 17    End of Session Equipment Utilized During Treatment: Gait belt;Right knee immobilizer Activity Tolerance: Patient limited by  fatigue;Patient limited by pain Patient left: in bed;with call bell/phone within reach Nurse Communication: Mobility status PT Visit Diagnosis: Difficulty in walking, not elsewhere classified (R26.2);Muscle weakness (generalized) (M62.81)     Time: 4098-1191 PT Time Calculation (min) (ACUTE ONLY): 18 min  Charges:  $Gait Training: 8-22 mins                     Mauro Kaufmann PT Acute Rehabilitation Services Pager [phone] Office [phone]    Yetta Marceaux 08/22/2019, 1:27 PM

## 2019-08-22 NOTE — Progress Notes (Signed)
Physical Therapy Treatment Patient Details Name: Micheal Dominguez MRN: [id number] DOB: [birth year] Today's Date: 08/22/2019    History of Present Illness Patient is 53 y.o. male s/p Rt TKA with PMH significant for HTN and jaw cancer.    PT Comments    Pt cooperative and wanting to attempt to mobilize but ltd by pain/fatigue.  Pt requesting up in chair for short time to allow change of bedding and change of position for him.  Will return pt to bed within the hour to keep L LE well elevated.   Follow Up Recommendations  Follow surgeon's recommendation for DC plan and follow-up therapies     Equipment Recommendations  None recommended by PT    Recommendations for Other Services       Precautions / Restrictions Precautions Precautions: Fall Required Braces or Orthoses: Knee Immobilizer - Right Knee Immobilizer - Right: Discontinue once straight leg raise with < 10 degree lag Restrictions Weight Bearing Restrictions: No Other Position/Activity Restrictions: WBAT    Mobility  Bed Mobility Overal bed mobility: Needs Assistance Bed Mobility: Supine to Sit     Supine to sit: Min assist     General bed mobility comments: cues for sequence and use of L LE to self assist  Transfers Overall transfer level: Needs assistance Equipment used: Rolling [place] (2 wheeled) Transfers: Sit to/from Stand Sit to Stand: Min assist         General transfer comment: cues for LE management and use of UEs to self assist  Ambulation/Gait Ambulation/Gait assistance: Min assist Gait Distance (Feet): 12 Feet Assistive device: Rolling [place] (2 wheeled) Gait Pattern/deviations: Step-to pattern;Decreased step length - right;Decreased step length - left;Shuffle;Antalgic;Trunk flexed Gait velocity: decreased   General Gait Details: cues for sequence, posture and position from RW; distance pain/fatigue ltd   Stairs             Wheelchair Mobility    Modified Rankin (Stroke Patients  Only)       Balance Overall balance assessment: Needs assistance Sitting-balance support: No upper extremity supported;Feet supported Sitting balance-Leahy Scale: Good     Standing balance support: During functional activity;Bilateral upper extremity supported Standing balance-Leahy Scale: Poor                              Cognition Arousal/Alertness: Awake/alert Behavior During Therapy: WFL for tasks assessed/performed Overall Cognitive Status: Within Functional Limits for tasks assessed                                        Exercises      General Comments        Pertinent Vitals/Pain Pain Assessment: 0-10 Pain Score: 7  Pain Location: Rt knee Pain Descriptors / Indicators: Aching;Sore Pain Intervention(s): Limited activity within patient's tolerance;Monitored during session;Premedicated before session;Ice applied    Home Living                      Prior Function            PT Goals (current goals can now be found in the care plan section) Acute Rehab PT Goals Patient Stated Goal: to gain back full ROM of knee PT Goal Formulation: With patient Time For Goal Achievement: 08/26/19 Potential to Achieve Goals: Good Progress towards PT goals: Not progressing toward goals - comment(ltd by pain/fatigue)  Frequency    7X/week      PT Plan Current plan remains appropriate    Co-evaluation              AM-PAC PT "6 Clicks" Mobility   Outcome Measure  Help needed turning from your back to your side while in a flat bed without using bedrails?: A Little Help needed moving from lying on your back to sitting on the side of a flat bed without using bedrails?: A Little Help needed moving to and from a bed to a chair (including a wheelchair)?: A Little Help needed standing up from a chair using your arms (e.g., wheelchair or bedside chair)?: A Little Help needed to walk in hospital room?: A Little Help needed climbing  3-5 steps with a railing? : A Lot 6 Click Score: 17    End of Session Equipment Utilized During Treatment: Gait belt;Right knee immobilizer Activity Tolerance: Patient limited by fatigue;Patient limited by pain Patient left: in chair;with call bell/phone within reach Nurse Communication: Mobility status PT Visit Diagnosis: Difficulty in walking, not elsewhere classified (R26.2);Muscle weakness (generalized) (M62.81)     Time: 8657-8469 PT Time Calculation (min) (ACUTE ONLY): 20 min  Charges:  $Gait Training: 8-22 mins                     Mauro Kaufmann PT Acute Rehabilitation Services Pager [phone] Office [phone]    Micheal Dominguez 08/22/2019, 1:23 PM

## 2019-08-22 NOTE — Progress Notes (Signed)
Physical Therapy Treatment Patient Details Name: Micheal Dominguez MRN: [id number] DOB: 05/28/1966 Today's Date: 08/22/2019    History of Present Illness Patient is 53 y.o. male s/p Rt TKA with PMH significant for HTN and jaw cancer.    PT Comments    Pt motivated and with improved pain control allowing increased activity tolerance this pm with pt ambulating increased distance in hall and up to bathroom including standing at sink for hygiene and brushing teeth.   Follow Up Recommendations  Follow surgeon's recommendation for DC plan and follow-up therapies     Equipment Recommendations  None recommended by PT    Recommendations for Other Services       Precautions / Restrictions Precautions Precautions: Fall Required Braces or Orthoses: Knee Immobilizer - Right Knee Immobilizer - Right: Discontinue once straight leg raise with < 10 degree lag Restrictions Weight Bearing Restrictions: No Other Position/Activity Restrictions: WBAT    Mobility  Bed Mobility Overal bed mobility: Needs Assistance Bed Mobility: Sit to Supine;Supine to Sit     Supine to sit: Min assist Sit to supine: Min assist   General bed mobility comments: cues for sequence and use of L LE to self assist  Transfers Overall transfer level: Needs assistance Equipment used: Rolling [place] (2 wheeled) Transfers: Sit to/from Stand Sit to Stand: Min guard         General transfer comment: cues for LE management and use of UEs to self assist  Ambulation/Gait Ambulation/Gait assistance: Min guard Gait Distance (Feet): 38 Feet(and 15' twice to/from bathroom) Assistive device: Rolling [place] (2 wheeled) Gait Pattern/deviations: Step-to pattern;Decreased step length - right;Decreased step length - left;Shuffle;Antalgic;Trunk flexed Gait velocity: decreased   General Gait Details: cues for sequence, posture and position from RW; distance pain/fatigue ltd   Stairs             Wheelchair Mobility    Modified Rankin (Stroke Patients Only)       Balance Overall balance assessment: Needs assistance Sitting-balance support: No upper extremity supported;Feet supported Sitting balance-Leahy Scale: Good     Standing balance support: During functional activity;Bilateral upper extremity supported Standing balance-Leahy Scale: Poor                              Cognition Arousal/Alertness: Awake/alert Behavior During Therapy: WFL for tasks assessed/performed Overall Cognitive Status: Within Functional Limits for tasks assessed                                        Exercises      General Comments        Pertinent Vitals/Pain Pain Assessment: 0-10 Pain Score: 6  Pain Location: Rt knee Pain Descriptors / Indicators: Aching;Sore Pain Intervention(s): Limited activity within patient's tolerance;Monitored during session;Premedicated before session;Ice applied    Home Living                      Prior Function            PT Goals (current goals can now be found in the care plan section) Acute Rehab PT Goals Patient Stated Goal: to gain back full ROM of knee PT Goal Formulation: With patient Time For Goal Achievement: 08/26/19 Potential to Achieve Goals: Good Progress towards PT goals: Progressing toward goals    Frequency    7X/week  PT Plan Current plan remains appropriate    Co-evaluation              AM-PAC PT "6 Clicks" Mobility   Outcome Measure  Help needed turning from your back to your side while in a flat bed without using bedrails?: A Little Help needed moving from lying on your back to sitting on the side of a flat bed without using bedrails?: A Little Help needed moving to and from a bed to a chair (including a wheelchair)?: A Little Help needed standing up from a chair using your arms (e.g., wheelchair or bedside chair)?: A Little Help needed to walk in hospital room?: A Little Help needed  climbing 3-5 steps with a railing? : A Lot 6 Click Score: 17    End of Session Equipment Utilized During Treatment: Gait belt;Right knee immobilizer Activity Tolerance: Patient tolerated treatment well;Patient limited by fatigue Patient left: in bed;with call bell/phone within reach;with family/visitor present Nurse Communication: Mobility status PT Visit Diagnosis: Difficulty in walking, not elsewhere classified (R26.2);Muscle weakness (generalized) (M62.81)     Time: 5852-7782 PT Time Calculation (min) (ACUTE ONLY): 38 min  Charges:  $Gait Training: 8-22 mins $Therapeutic Activity: 8-22 mins                     Mauro Kaufmann PT Acute Rehabilitation Services Pager [phone] Office [phone]    [facility] 08/22/2019, 4:38 PM

## 2019-08-23 MED ORDER — BISACODYL 10 MG RE SUPP: 10.0000 mg | Freq: Once | RECTAL | Status: DC

## 2019-08-23 NOTE — Plan of Care (Signed)

## 2019-08-23 NOTE — Progress Notes (Signed)
Patient now resting comfortably in bed, pain medicine was effective, vss, dressing intact, call bell in reach, will continue to monitor.

## 2019-08-23 NOTE — Progress Notes (Signed)
Orthopedics Progress Note  Subjective: Patient nauseated and constipated. No BM yet  Objective:  Vitals:   08/22/19 2041 08/23/19 0550  BP: 139/85 126/81  Pulse: (!) 102 99  Resp: 18 18  Temp: 98.7 F (37.1 C) 98 F (36.7 C)  SpO2: 100% 97%    General: Awake and alert  Musculoskeletal: Right knee dressing changed. Several small blisters but no drainage and no erythema. Compartments are a little tight but no concern for compartment syndrome. No pain with calf pumps Neurovascularly intact  Lab Results  Component Value Date   WBC 11.0 (H) 08/22/2019   HGB 9.3 (L) 08/22/2019   HCT 27.8 (L) 08/22/2019   MCV 88.5 08/22/2019   PLT 245 08/22/2019       Component Value Date/Time   NA 137 08/22/2019 1236   K 5.0 08/22/2019 1236   CL 103 08/22/2019 1236   CO2 21 (L) 08/22/2019 1236   GLUCOSE 181 (H) 08/22/2019 1236   BUN 19 08/22/2019 1236   CREATININE 1.11 08/22/2019 1236   CALCIUM 8.5 (L) 08/22/2019 1236   GFRNONAA >60 08/22/2019 1236   GFRAA >60 08/22/2019 1236    Lab Results  Component Value Date   INR 0.9 08/14/2019   INR 0.9 01/21/2019    Assessment/Plan: POD #4 s/p Procedure(s): TOTAL KNEE ARTHROPLASTY Needs more therapy and needs to have a BM. Miralax on board, will back up with dulcolax suppository if necessary  Doran Heater. Veverly Fells, MD 08/23/2019 9:23 AM

## 2019-08-23 NOTE — Progress Notes (Signed)
Patient requested for knee immobilizer to be placed on while in bed with ice packs inside. Medicated for pain with robaxin and tylenol per mar, repositioned for comfort, will monitor.

## 2019-08-23 NOTE — Progress Notes (Signed)
Physical Therapy Treatment Patient Details Name: Micheal Dominguez MRN: [id number] DOB: 05-Apr-1966 Today's Date: 08/23/2019    History of Present Illness Patient is 53 y.o. male s/p Rt TKA with PMH significant for HTN and jaw cancer.    PT Comments    Pt continues cooperative and progressing slowly with therex program and all mobility tasks.   Follow Up Recommendations  Follow surgeon's recommendation for DC plan and follow-up therapies     Equipment Recommendations  None recommended by PT    Recommendations for Other Services       Precautions / Restrictions Precautions Precautions: Fall Required Braces or Orthoses: Knee Immobilizer - Right Knee Immobilizer - Right: Discontinue once straight leg raise with < 10 degree lag Restrictions Weight Bearing Restrictions: No Other Position/Activity Restrictions: WBAT    Mobility  Bed Mobility Overal bed mobility: Needs Assistance Bed Mobility: Supine to Sit;Sit to Supine     Supine to sit: Min assist Sit to supine: Min guard   General bed mobility comments: pt self assisting R LE with gait belt  Transfers Overall transfer level: Needs assistance Equipment used: Rolling [place] (2 wheeled) Transfers: Sit to/from Stand Sit to Stand: Supervision         General transfer comment: cues for LE management and use of UEs to self assist  Ambulation/Gait Ambulation/Gait assistance: Min guard Gait Distance (Feet): 100 Feet(and 15' into bathroom) Assistive device: Rolling [place]es (2 wheeled) Gait Pattern/deviations: Step-to pattern;Decreased step length - right;Decreased step length - left;Shuffle;Antalgic;Trunk flexed Gait velocity: decreased   General Gait Details: min cues for sequence, posture and position from RW; distance pain/fatigue ltd   Stairs             Wheelchair Mobility    Modified Rankin (Stroke Patients Only)       Balance Overall balance assessment: Needs assistance Sitting-balance support: No  upper extremity supported;Feet supported Sitting balance-Leahy Scale: Good     Standing balance support: During functional activity;Bilateral upper extremity supported Standing balance-Leahy Scale: Fair                              Cognition Arousal/Alertness: Awake/alert Behavior During Therapy: WFL for tasks assessed/performed Overall Cognitive Status: Within Functional Limits for tasks assessed                                        Exercises Total Joint Exercises Ankle Circles/Pumps: AROM;Both;Supine;15 reps Quad Sets: AROM;10 reps;Supine;Both Heel Slides: AAROM;Right;15 reps;Supine Straight Leg Raises: AAROM;Right;Supine;20 reps    General Comments        Pertinent Vitals/Pain Pain Assessment: 0-10 Pain Score: 6  Pain Location: Rt knee Pain Descriptors / Indicators: Aching;Sore Pain Intervention(s): Limited activity within patient's tolerance;Monitored during session;Premedicated before session;Ice applied    Home Living                      Prior Function            PT Goals (current goals can now be found in the care plan section) Acute Rehab PT Goals Patient Stated Goal: to gain back full ROM of knee PT Goal Formulation: With patient Time For Goal Achievement: 08/26/19 Potential to Achieve Goals: Good Progress towards PT goals: Progressing toward goals    Frequency    7X/week      PT Plan Current plan  remains appropriate    Co-evaluation              AM-PAC PT "6 Clicks" Mobility   Outcome Measure  Help needed turning from your back to your side while in a flat bed without using bedrails?: A Little Help needed moving from lying on your back to sitting on the side of a flat bed without using bedrails?: A Little Help needed moving to and from a bed to a chair (including a wheelchair)?: A Little Help needed standing up from a chair using your arms (e.g., wheelchair or bedside chair)?: A Little Help  needed to walk in hospital room?: A Little Help needed climbing 3-5 steps with a railing? : A Lot 6 Click Score: 17    End of Session Equipment Utilized During Treatment: Gait belt;Right knee immobilizer Activity Tolerance: Patient tolerated treatment well Patient left: in bed;with call bell/phone within reach Nurse Communication: Mobility status PT Visit Diagnosis: Difficulty in walking, not elsewhere classified (R26.2);Muscle weakness (generalized) (M62.81)     Time: 9562-1308 PT Time Calculation (min) (ACUTE ONLY): 45 min  Charges:  $Gait Training: 8-22 mins $Therapeutic Exercise: 8-22 mins $Therapeutic Activity: 8-22 mins                     Mauro Kaufmann PT Acute Rehabilitation Services Pager [phone] Office [phone]    Dameisha Tschida 08/23/2019, 4:24 PM

## 2019-08-23 NOTE — Progress Notes (Signed)
Patient had bowel movement today, pain is improving, participated in therapy, possible D/C home tomorrow, will continue to monitor.

## 2019-08-23 NOTE — Plan of Care (Signed)
  Problem: Clinical Measurements: Goal: Postoperative complications will be avoided or minimized Outcome: Progressing   Problem: Skin Integrity: Goal: Will show signs of wound healing Outcome: Progressing   Problem: Pain Management: Goal: Pain level will decrease with appropriate interventions Outcome: Progressing   Problem: Clinical Measurements: Goal: Will remain free from infection Outcome: Progressing   Problem: Clinical Measurements: Goal: Respiratory complications will improve Outcome: Progressing   Problem: Clinical Measurements: Goal: Cardiovascular complication will be avoided Outcome: Progressing

## 2019-08-23 NOTE — Progress Notes (Signed)
Pain continues to be an issue for pt overall. Pt reports moderate to severe pain with minimal movement at times. Pt was able to walk to bathroom and back to bed. Though this was painful. Rn medicating pt for pain. No needs or signs of distress at this time. Will continue to monitor.

## 2019-08-23 NOTE — Progress Notes (Signed)
Physical Therapy Treatment Patient Details Name: Micheal Dominguez MRN: [id number] DOB: June 26, 1966 Today's Date: 08/23/2019    History of Present Illness Patient is 53 y.o. male s/p Rt TKA with PMH significant for HTN and jaw cancer.    PT Comments    Pt motivated and with improved pain control.  Therex program re-initiated and pt tolerating increased ambulation.  Follow Up Recommendations  Follow surgeon's recommendation for DC plan and follow-up therapies     Equipment Recommendations  None recommended by PT    Recommendations for Other Services       Precautions / Restrictions Precautions Precautions: Fall Required Braces or Orthoses: Knee Immobilizer - Right Knee Immobilizer - Right: Discontinue once straight leg raise with < 10 degree lag Restrictions Weight Bearing Restrictions: No Other Position/Activity Restrictions: WBAT    Mobility  Bed Mobility Overal bed mobility: Needs Assistance Bed Mobility: Supine to Sit     Supine to sit: Min assist     General bed mobility comments: cues for sequence and use of L LE to self assist  Transfers Overall transfer level: Needs assistance Equipment used: Rolling [place] (2 wheeled) Transfers: Sit to/from Stand Sit to Stand: Min guard         General transfer comment: cues for LE management and use of UEs to self assist  Ambulation/Gait Ambulation/Gait assistance: Min guard Gait Distance (Feet): 75 Feet(and 15' into bathroom) Assistive device: Rolling [place]ch (2 wheeled) Gait Pattern/deviations: Step-to pattern;Decreased step length - right;Decreased step length - left;Shuffle;Antalgic;Trunk flexed Gait velocity: decreased   General Gait Details: cues for sequence, posture and position from RW; distance pain/fatigue ltd   Stairs             Wheelchair Mobility    Modified Rankin (Stroke Patients Only)       Balance Overall balance assessment: Needs assistance Sitting-balance support: No upper  extremity supported;Feet supported Sitting balance-Leahy Scale: Good     Standing balance support: During functional activity;Bilateral upper extremity supported Standing balance-Leahy Scale: Fair                              Cognition Arousal/Alertness: Awake/alert Behavior During Therapy: WFL for tasks assessed/performed Overall Cognitive Status: Within Functional Limits for tasks assessed                                        Exercises Total Joint Exercises Ankle Circles/Pumps: AROM;Both;Supine;15 reps Quad Sets: AROM;10 reps;Supine;Both Heel Slides: AAROM;Right;15 reps;Supine Straight Leg Raises: AAROM;Right;10 reps;Supine Goniometric ROM: AAROM R knee -8 - 30    General Comments        Pertinent Vitals/Pain Pain Assessment: 0-10 Pain Score: 6  Pain Location: Rt knee Pain Descriptors / Indicators: Aching;Sore Pain Intervention(s): Premedicated before session;Monitored during session;Limited activity within patient's tolerance    Home Living                      Prior Function            PT Goals (current goals can now be found in the care plan section) Acute Rehab PT Goals Patient Stated Goal: to gain back full ROM of knee PT Goal Formulation: With patient Time For Goal Achievement: 08/26/19 Potential to Achieve Goals: Good Progress towards PT goals: Progressing toward goals    Frequency    7X/week  PT Plan Current plan remains appropriate    Co-evaluation              AM-PAC PT "6 Clicks" Mobility   Outcome Measure  Help needed turning from your back to your side while in a flat bed without using bedrails?: A Little Help needed moving from lying on your back to sitting on the side of a flat bed without using bedrails?: A Little Help needed moving to and from a bed to a chair (including a wheelchair)?: A Little Help needed standing up from a chair using your arms (e.g., wheelchair or bedside chair)?:  A Little Help needed to walk in hospital room?: A Little Help needed climbing 3-5 steps with a railing? : A Lot 6 Click Score: 17    End of Session Equipment Utilized During Treatment: Gait belt;Right knee immobilizer Activity Tolerance: Patient tolerated treatment well Patient left: Other (comment)(bathroom ) Nurse Communication: Mobility status PT Visit Diagnosis: Difficulty in walking, not elsewhere classified (R26.2);Muscle weakness (generalized) (M62.81)     Time: 3086-5784 PT Time Calculation (min) (ACUTE ONLY): 40 min  Charges:  $Gait Training: 8-22 mins $Therapeutic Exercise: 8-22 mins $Therapeutic Activity: 8-22 mins                     Mauro Kaufmann PT Acute Rehabilitation Services Pager [phone] Office [phone]    Shawntia Mangal 08/23/2019, 9:55 AM

## 2019-08-24 MED ORDER — HYDROCHLOROTHIAZIDE 25 MG PO TABS
25.0000 mg | ORAL_TABLET | Freq: Every day | ORAL | Status: DC
  Administered 2019-08-24: 25 mg via ORAL
  Filled 2019-08-24: qty 1

## 2019-08-24 NOTE — Progress Notes (Signed)
Physical Therapy Treatment Patient Details Name: Micheal Dominguez MRN: [id number] DOB: 1966/11/05 Today's Date: 08/24/2019    History of Present Illness Patient is 53 y.o. male s/p Rt TKA with PMH significant for HTN and jaw cancer.    PT Comments    POD # 5 pm session with spouse. Instructed on KI use for amb and stairs for increased support.  Instructed to D/C when able to perform 10 active SLR.  Also educated on proper application and removal.  Assisted OOB.  Assisted with amb and stairs.  Returned to bed per pt request pain and fatigue.  Pt now plans to sleep in the "downstaris bedroom" as he struggled with 2 steps to enter home.   Addressed all mobility questions, discussed appropriate activity, educated on use of ICE.  Pt ready for D/C to home.   Follow Up Recommendations  Follow surgeon's recommendation for DC plan and follow-up therapies     Equipment Recommendations  None recommended by PT    Recommendations for Other Services       Precautions / Restrictions Precautions Precautions: Fall Required Braces or Orthoses: Knee Immobilizer - Right;Other Brace Knee Immobilizer - Right: Discontinue once straight leg raise with < 10 degree lag Other Brace: L knee hinge brace Restrictions Weight Bearing Restrictions: No Other Position/Activity Restrictions: WBAT    Mobility  Bed Mobility Overal bed mobility: Needs Assistance Bed Mobility: Supine to Sit;Sit to Supine     Supine to sit: Supervision;Min guard Sit to supine: Min guard;Min assist   General bed mobility comments: demonstarted and instructed how to use belt loop to self assist  Transfers Overall transfer level: Needs assistance Equipment used: Rolling [place] (2 wheeled) Transfers: Sit to/from Stand Sit to Stand: Supervision         General transfer comment: cues for LE management and use of UEs to self assist with increased time  Ambulation/Gait Ambulation/Gait assistance: Min guard Gait Distance  (Feet): 22 Feet Assistive device: Rolling Amadon (2 wheeled) Gait Pattern/deviations: Step-to pattern;Decreased step length - right;Decreased step length - left;Shuffle;Antalgic;Trunk flexed Gait velocity: decreased   General Gait Details: with spouse "hands on" instructed on safe handling a decreased distance just to the portable stairs and back   Stairs Stairs: Yes Stairs assistance: Min assist Stair Management: Step to pattern;Forwards;With Dickmann Number of Stairs: 2 General stair comments: 50% VC's on proper Stjames placement and assist level placement of spouse using Doland up forward   Wheelchair Mobility    Modified Rankin (Stroke Patients Only)       Balance                                            Cognition Arousal/Alertness: Awake/alert Behavior During Therapy: WFL for tasks assessed/performed Overall Cognitive Status: Within Functional Limits for tasks assessed                                 General Comments: increased pain today      Exercises      General Comments        Pertinent Vitals/Pain Pain Assessment: No/denies pain Pain Score: 8  Pain Location: Rt knee Pain Descriptors / Indicators: Grimacing Pain Intervention(s): Monitored during session;Repositioned;Patient requesting pain meds-RN notified;Ice applied    Home Living  Prior Function            PT Goals (current goals can now be found in the care plan section) Progress towards PT goals: Progressing toward goals    Frequency    7X/week      PT Plan Current plan remains appropriate    Co-evaluation              AM-PAC PT "6 Clicks" Mobility   Outcome Measure  Help needed turning from your back to your side while in a flat bed without using bedrails?: A Little Help needed moving from lying on your back to sitting on the side of a flat bed without using bedrails?: A Little Help needed moving to and from a  bed to a chair (including a wheelchair)?: A Little Help needed standing up from a chair using your arms (e.g., wheelchair or bedside chair)?: A Little Help needed to walk in hospital room?: A Little Help needed climbing 3-5 steps with a railing? : A Lot 6 Click Score: 17    End of Session Equipment Utilized During Treatment: Gait belt;Right knee immobilizer Activity Tolerance: Patient limited by pain Patient left: in bed;with call bell/phone within reach Nurse Communication: Mobility status;Patient requests pain meds PT Visit Diagnosis: Difficulty in walking, not elsewhere classified (R26.2);Muscle weakness (generalized) (M62.81)     Time: 1610-9604 PT Time Calculation (min) (ACUTE ONLY): 29 min  Charges:  $Gait Training: 8-22 mins $Therapeutic Exercise: 8-22 mins                     Felecia Shelling  PTA Acute  Rehabilitation Services Pager      [phone] Office      [phone]

## 2019-08-24 NOTE — Progress Notes (Signed)
   Subjective: 5 Days Post-Op Procedure(s) (LRB): TOTAL KNEE ARTHROPLASTY (Right) Patient reports pain as moderate.   Patient seen in rounds for Dr. Gladstone Lighter. Patient is well, and has had no acute complaints or problems other than discomfort in the right knee. He denies SOB and chest pain. Had BM yesterday. Voiding well. Reports that he has not had a lot of sleep.  Plan is to go Home after hospital stay.  Objective: Vital signs in last 24 hours: Temp:  [98.3 F (36.8 C)-98.9 F (37.2 C)] 98.9 F (37.2 C) (10/19 0528) Pulse Rate:  [101-106] 106 (10/19 0528) Resp:  [19-20] 19 (10/19 0528) BP: (139-166)/(82-94) 164/94 (10/19 0528) SpO2:  [94 %-99 %] 94 % (10/19 0528)  Intake/Output from previous day:  Intake/Output Summary (Last 24 hours) at 08/24/2019 0712 Last data filed at 08/24/2019 0600 Gross per 24 hour  Intake 2280 ml  Output 4250 ml  Net -1970 ml     Labs: Recent Labs    08/22/19 0302 08/22/19 1236  HGB 9.3* 9.3*   Recent Labs    08/22/19 0302 08/22/19 1236  WBC 10.7* 11.0*  RBC 3.14* 3.14*  HCT 28.3* 27.8*  PLT 237 245   Recent Labs    08/21/19 1342 08/22/19 1236  NA 130* 137  K 3.6 5.0  CL 95* 103  CO2 24 21*  BUN 11 19  CREATININE 0.80 1.11  GLUCOSE 116* 181*  CALCIUM 8.3* 8.5*    EXAM General - Patient is Alert and Oriented Extremity - Neurologically intact Intact pulses distally Dorsiflexion/Plantar flexion intact No cellulitis present Compartment soft Dressing/Incision - clean, dry, no drainage Fracture blisters present at the proximal portion of the knee as well as at the medial aspect of the midportion of the incision.  Motor Function - intact, moving foot and toes well on exam.   Past Medical History:  Diagnosis Date  . Cancer ([place])    jaw  . Hypertension   . RBBB    Hx - no problems    Assessment/Plan: 5 Days Post-Op Procedure(s) (LRB): TOTAL KNEE ARTHROPLASTY (Right) Active Problems:   Total knee replacement status,  right  Estimated body mass index is 37.9 kg/m as calculated from the following:   Height as of this encounter: 5\' 7"  (1.702 m).   Weight as of this encounter: 109.8 kg. Advance diet Up with therapy  DVT Prophylaxis - Xarelto Weight-Bearing as tolerated   Will have him get up with therapy today. Plan for DC home today with outpatient therapy scheduled for tomorrow. Follow up in office in 10 days. Discharge instructions given.   [name], PA-C Orthopaedic Surgery 08/24/2019, 7:12 AM

## 2019-08-24 NOTE — Plan of Care (Signed)
Patient discharged home in stable condition 

## 2019-08-24 NOTE — Progress Notes (Signed)
Physical Therapy Treatment Patient Details Name: Micheal Dominguez MRN: [id number] DOB: 1965-11-16 Today's Date: 08/24/2019    History of Present Illness Patient is 53 y.o. male s/p Rt TKA with PMH significant for HTN and jaw cancer.    PT Comments    POD # 5 am session Applied KI as pt was unable to self perform SLR.  Applied L knee hinge brace that pt brought from home.  Assisted OOB. General bed mobility comments: demonstarted and instructed how to use belt loop to self assist.  General transfer comment: cues for LE management and use of UEs to self assist with increased time.  General Gait Details: decreased amb distance due to increased c/o R knee pain and "calf tightness". Then returned to room to perform some TE's following HEP handout.  Instructed on proper tech, freq as well as use of ICE.   Pt will need another PT session to practice stairs once spouse arrives. Pt hopes he can perform a flight of stairs to go up to bed room tonight.    Follow Up Recommendations  Follow surgeon's recommendation for DC plan and follow-up therapies     Equipment Recommendations  None recommended by PT    Recommendations for Other Services       Precautions / Restrictions Precautions Precautions: Fall Required Braces or Orthoses: Knee Immobilizer - Right;Other Brace Knee Immobilizer - Right: Discontinue once straight leg raise with < 10 degree lag Other Brace: L knee hinge brace Restrictions Weight Bearing Restrictions: No Other Position/Activity Restrictions: WBAT    Mobility  Bed Mobility Overal bed mobility: Needs Assistance Bed Mobility: Supine to Sit;Sit to Supine     Supine to sit: Supervision;Min guard Sit to supine: Min guard;Min assist   General bed mobility comments: demonstarted and instructed how to use belt loop to self assist  Transfers Overall transfer level: Needs assistance Equipment used: Rolling [place] (2 wheeled) Transfers: Sit to/from Stand Sit to Stand:  Supervision         General transfer comment: cues for LE management and use of UEs to self assist with increased time  Ambulation/Gait Ambulation/Gait assistance: Min guard Gait Distance (Feet): 45 Feet Assistive device: Rolling Jr (2 wheeled) Gait Pattern/deviations: Step-to pattern;Decreased step length - right;Decreased step length - left;Shuffle;Antalgic;Trunk flexed Gait velocity: decreased   General Gait Details: decreased amb distance due to increased c/o R knee pain and "calf tightness".   Stairs             Wheelchair Mobility    Modified Rankin (Stroke Patients Only)       Balance                                            Cognition Arousal/Alertness: Awake/alert Behavior During Therapy: WFL for tasks assessed/performed Overall Cognitive Status: Within Functional Limits for tasks assessed                                 General Comments: increased pain today      Exercises   Total Knee Replacement TE's 10 reps B LE ankle pumps 5 reps towel squeezes 5 reps knee presses 5 reps heel slides  3 reps SAQ's 5 reps SLR's AAROM 5 reps ABD Followed by ICE     General Comments  Pertinent Vitals/Pain Pain Assessment: No/denies pain Pain Score: 8  Pain Location: Rt knee Pain Descriptors / Indicators: Grimacing Pain Intervention(s): Monitored during session;Repositioned;Patient requesting pain meds-RN notified;Ice applied    Home Living                      Prior Function            PT Goals (current goals can now be found in the care plan section) Progress towards PT goals: Progressing toward goals    Frequency    7X/week      PT Plan Current plan remains appropriate    Co-evaluation              AM-PAC PT "6 Clicks" Mobility   Outcome Measure  Help needed turning from your back to your side while in a flat bed without using bedrails?: A Little Help needed moving from  lying on your back to sitting on the side of a flat bed without using bedrails?: A Little Help needed moving to and from a bed to a chair (including a wheelchair)?: A Little Help needed standing up from a chair using your arms (e.g., wheelchair or bedside chair)?: A Little Help needed to walk in hospital room?: A Little Help needed climbing 3-5 steps with a railing? : A Lot 6 Click Score: 17    End of Session Equipment Utilized During Treatment: Gait belt;Right knee immobilizer Activity Tolerance: Patient limited by pain Patient left: in bed;with call bell/phone within reach Nurse Communication: Mobility status;Patient requests pain meds PT Visit Diagnosis: Difficulty in walking, not elsewhere classified (R26.2);Muscle weakness (generalized) (M62.81)     Time: 1000-1030 PT Time Calculation (min) (ACUTE ONLY): 30 min  Charges:  $Gait Training: 8-22 mins $Therapeutic Exercise: 8-22 mins                     Felecia Shelling  PTA Acute  Rehabilitation Services Pager      [phone] Office      [phone]

## 2019-08-24 NOTE — TOC Transition Note (Signed)
Transition of Care [facility]) - CM/SW Discharge Note   Patient Details  Name: Micheal Dominguez MRN: [id number] Date of Birth: [birth year]  Transition of Care [facility]) CM/SW Contact:  Leeroy Cha, RN Phone Number: 08/24/2019, 10:25 AM   Clinical Narrative:    dcd to home with equip.  Will have [facility]opt at [facility]   Final next level of care: OP Rehab Barriers to Discharge: No Barriers Identified   Patient Goals and CMS Choice Patient states their goals for this hospitalization and ongoing recovery are:: to go home CMS Medicare.gov Compare Post Acute Care list provided to:: Patient Choice offered to / list presented to : Patient  Discharge Placement                       Discharge Plan and Services   Discharge Planning Services: CM Consult Post Acute Care Choice: Durable Medical Equipment                               Social Determinants of Health (SDOH) Interventions     Readmission Risk Interventions No flowsheet data found.

## 2019-08-25 NOTE — Discharge Summary (Signed)
Physician Discharge Summary   Patient ID: Micheal Dominguez MRN: [id number] DOB/AGE: 1966/04/11 53 y.o.  Admit date: 08/19/2019 Discharge date: 08/24/2019  Primary Diagnosis: Primary osteoarthritis right knee   Admission Diagnoses:  Past Medical History:  Diagnosis Date   Cancer (HCC)    jaw   Hypertension    RBBB    Hx - no problems   Discharge Diagnoses:   Active Problems:   Total knee replacement status, right  Estimated body mass index is 37.9 kg/m as calculated from the following:   Height as of this encounter: 5\' 7"  (1.702 m).   Weight as of this encounter: 109.8 kg.  Procedure:  Procedure(s) (LRB): TOTAL KNEE ARTHROPLASTY (Right)   Consults: None  HPI: Micheal Dominguez, 53 y.o. male, has a history of pain and functional disability in the right knee due to arthritis and has failed non-surgical conservative treatments for greater than 12 weeks to includeNSAID's and/or analgesics, corticosteriod injections, viscosupplementation injections, flexibility and strengthening excercises, weight reduction as appropriate and activity modification.  Onset of symptoms was gradual, starting 5 years ago with gradually worsening course since that time. The patient noted prior procedures on the knee to include  arthroscopy and menisectomy on the right knee(s).  Patient currently rates pain in the right knee(s) at 7 out of 10 with activity. Patient has night pain, worsening of pain with activity and weight bearing, pain that interferes with activities of daily living, pain with passive range of motion, crepitus and joint swelling.  Patient has evidence of periarticular osteophytes and joint space narrowing by imaging studies. There is no active infection.  Laboratory Data: Admission on 08/19/2019, Discharged on 08/24/2019  Component Date Value Ref Range Status   WBC 08/20/2019 15.9* 4.0 - 10.5 K/uL Final   RBC 08/20/2019 3.92* 4.22 - 5.81 MIL/uL Final   Hemoglobin 08/20/2019 11.5* 13.0  - 17.0 g/dL Final   HCT 36/64/4034 35.4* 39.0 - 52.0 % Final   MCV 08/20/2019 90.3  80.0 - 100.0 fL Final   MCH 08/20/2019 29.3  26.0 - 34.0 pg Final   MCHC 08/20/2019 32.5  30.0 - 36.0 g/dL Final   RDW 74/25/9563 12.4  11.5 - 15.5 % Final   Platelets 08/20/2019 279  150 - 400 K/uL Final   nRBC 08/20/2019 0.0  0.0 - 0.2 % Final   Performed at [facility], [address], [place], [place] [address]   Sodium 08/20/2019 133* 135 - 145 mmol/L Final   Potassium 08/20/2019 4.2  3.5 - 5.1 mmol/L Final   Chloride 08/20/2019 103  98 - 111 mmol/L Final   CO2 08/20/2019 21* 22 - 32 mmol/L Final   Glucose, Bld 08/20/2019 155* 70 - 99 mg/dL Final   BUN 33/29/5188 14  6 - 20 mg/dL Final   Creatinine, Ser 08/20/2019 0.75  0.61 - 1.24 mg/dL Final   Calcium 41/66/0630 8.3* 8.9 - 10.3 mg/dL Final   GFR calc non Af Amer 08/20/2019 >60  >60 mL/min Final   GFR calc Af Amer 08/20/2019 >60  >60 mL/min Final   Anion gap 08/20/2019 9  5 - 15 Final   Performed at [facility], [address], [place], [place] [address]   WBC 08/21/2019 14.4* 4.0 - 10.5 K/uL Final   RBC 08/21/2019 3.47* 4.22 - 5.81 MIL/uL Final   Hemoglobin 08/21/2019 10.1* 13.0 - 17.0 g/dL Final   HCT 93/23/5573 31.1* 39.0 - 52.0 % Final   MCV 08/21/2019 89.6  80.0 -  100.0 fL Final   MCH 08/21/2019 29.1  26.0 - 34.0 pg Final   MCHC 08/21/2019 32.5  30.0 - 36.0 g/dL Final   RDW 27/25/3664 12.6  11.5 - 15.5 % Final   Platelets 08/21/2019 261  150 - 400 K/uL Final   nRBC 08/21/2019 0.0  0.0 - 0.2 % Final   Performed at [facility], [address], [place], [place] [address]   Sodium 08/21/2019 128* 135 - 145 mmol/L Final   Potassium 08/21/2019 3.8  3.5 - 5.1 mmol/L Final   Chloride 08/21/2019 97* 98 - 111 mmol/L Final   CO2 08/21/2019 24  22 - 32 mmol/L Final   Glucose, Bld 08/21/2019 150* 70 - 99 mg/dL Final   BUN 42/59/5638 11  6 - 20 mg/dL Final     Creatinine, Ser 08/21/2019 0.79  0.61 - 1.24 mg/dL Final   Calcium 75/64/3329 8.1* 8.9 - 10.3 mg/dL Final   GFR calc non Af Amer 08/21/2019 >60  >60 mL/min Final   GFR calc Af Amer 08/21/2019 >60  >60 mL/min Final   Anion gap 08/21/2019 7  5 - 15 Final   Performed at [facility], [address] [place], [place] [address]   Sodium 08/21/2019 130* 135 - 145 mmol/L Final   Potassium 08/21/2019 3.6  3.5 - 5.1 mmol/L Final   Chloride 08/21/2019 95* 98 - 111 mmol/L Final   CO2 08/21/2019 24  22 - 32 mmol/L Final   Glucose, Bld 08/21/2019 116* 70 - 99 mg/dL Final   BUN 16/60/6301 11  6 - 20 mg/dL Final   Creatinine, Ser 08/21/2019 0.80  0.61 - 1.24 mg/dL Final   Calcium 60/08/9322 8.3* 8.9 - 10.3 mg/dL Final   GFR calc non Af Amer 08/21/2019 >60  >60 mL/min Final   GFR calc Af Amer 08/21/2019 >60  >60 mL/min Final   Anion gap 08/21/2019 11  5 - 15 Final   Performed at [facility], [address] [place], [place] [address]   WBC 08/22/2019 10.7* 4.0 - 10.5 K/uL Final   RBC 08/22/2019 3.14* 4.22 - 5.81 MIL/uL Final   Hemoglobin 08/22/2019 9.3* 13.0 - 17.0 g/dL Final   HCT 20/25/4270 28.3* 39.0 - 52.0 % Final   MCV 08/22/2019 90.1  80.0 - 100.0 fL Final   MCH 08/22/2019 29.6  26.0 - 34.0 pg Final   MCHC 08/22/2019 32.9  30.0 - 36.0 g/dL Final   RDW 62/37/6283 12.5  11.5 - 15.5 % Final   Platelets 08/22/2019 237  150 - 400 K/uL Final   nRBC 08/22/2019 0.0  0.0 - 0.2 % Final   Performed at [facility], [address], [place], [place] [address]   WBC 08/22/2019 11.0* 4.0 - 10.5 K/uL Final   RBC 08/22/2019 3.14* 4.22 - 5.81 MIL/uL Final   Hemoglobin 08/22/2019 9.3* 13.0 - 17.0 g/dL Final   HCT 16/05/3709 27.8* 39.0 - 52.0 % Final   MCV 08/22/2019 88.5  80.0 - 100.0 fL Final   MCH 08/22/2019 29.6  26.0 - 34.0 pg Final   MCHC 08/22/2019 33.5  30.0 - 36.0 g/dL Final   RDW 62/69/4854 12.6  11.5 -  15.5 % Final   Platelets 08/22/2019 245  150 - 400 K/uL Final   nRBC 08/22/2019 0.0  0.0 - 0.2 % Final   Performed at [facility], [address], [place], [place] [address]   Sodium 08/22/2019 137  135 -  145 mmol/L Final   DELTA CHECK NOTED   Potassium 08/22/2019 5.0  3.5 - 5.1 mmol/L Final   SLIGHT HEMOLYSIS   Chloride 08/22/2019 103  98 - 111 mmol/L Final   CO2 08/22/2019 21* 22 - 32 mmol/L Final   Glucose, Bld 08/22/2019 181* 70 - 99 mg/dL Final   BUN 40/98/1191 19  6 - 20 mg/dL Final   Creatinine, Ser 08/22/2019 1.11  0.61 - 1.24 mg/dL Final   Calcium 47/82/9562 8.5* 8.9 - 10.3 mg/dL Final   GFR calc non Af Amer 08/22/2019 >60  >60 mL/min Final   GFR calc Af Amer 08/22/2019 >60  >60 mL/min Final   Anion gap 08/22/2019 13  5 - 15 Final   Performed at [facility], [address] [place], [place] [address]  Hospital Outpatient Visit on 08/15/2019  Component Date Value Ref Range Status   SARS-CoV-2, NAA 08/15/2019 NOT DETECTED  NOT DETECTED Final   Comment: (NOTE) This nucleic acid amplification test was developed and its performance characteristics determined by [employer]. Nucleic acid amplification tests include PCR and TMA. This test has not been FDA cleared or approved. This test has been authorized by FDA under an Emergency Use Authorization (EUA). This test is only authorized for the duration of time the declaration that circumstances exist justifying the authorization of the emergency use of in vitro diagnostic tests for detection of SARS-CoV-2 virus and/or diagnosis of COVID-19 infection under section 564(b)(1) of the Act, 21 U.S.C. 578ION-6(E) (1), unless the authorization is terminated or revoked sooner. When diagnostic testing is negative, the possibility of a false negative result should be considered in the context of a patient's recent exposures and the presence of clinical signs and  symptoms consistent with COVID-19. An individual without symptoms of COVID- 19 and who is not shedding SARS-CoV-2 vi                          rus would expect to have a negative (not detected) result in this assay. Performed At: [facility] [address] [place], [place] [id number] Jolene Schimke MD MW:[id number]    Coronavirus Source 08/15/2019 NASOPHARYNGEAL   Final   Performed at [facility], [address] [place], [place] [address]  Hospital Outpatient Visit on 08/14/2019  Component Date Value Ref Range Status   aPTT 08/14/2019 26  24 - 36 seconds Final   Performed at [facility], [address], [place], [place] [address]   WBC 08/14/2019 9.3  4.0 - 10.5 K/uL Final   RBC 08/14/2019 4.71  4.22 - 5.81 MIL/uL Final   Hemoglobin 08/14/2019 13.8  13.0 - 17.0 g/dL Final   HCT 63/87/5643 42.5  39.0 - 52.0 % Final   MCV 08/14/2019 90.2  80.0 - 100.0 fL Final   MCH 08/14/2019 29.3  26.0 - 34.0 pg Final   MCHC 08/14/2019 32.5  30.0 - 36.0 g/dL Final   RDW 32/95/1884 12.6  11.5 - 15.5 % Final   Platelets 08/14/2019 314  150 - 400 K/uL Final   nRBC 08/14/2019 0.0  0.0 - 0.2 % Final   Neutrophils Relative % 08/14/2019 58  % Final   Neutro Abs 08/14/2019 5.5  1.7 - 7.7 K/uL Final   Lymphocytes Relative 08/14/2019 26  % Final   Lymphs Abs 08/14/2019 2.4  0.7 - 4.0 K/uL Final   Monocytes Relative 08/14/2019 10  % Final   Monocytes Absolute 08/14/2019  0.9  0.1 - 1.0 K/uL Final   Eosinophils Relative 08/14/2019 4  % Final   Eosinophils Absolute 08/14/2019 0.4  0.0 - 0.5 K/uL Final   Basophils Relative 08/14/2019 1  % Final   Basophils Absolute 08/14/2019 0.1  0.0 - 0.1 K/uL Final   Immature Granulocytes 08/14/2019 1  % Final   Abs Immature Granulocytes 08/14/2019 0.06  0.00 - 0.07 K/uL Final   Performed at [facility], [address] [place], [place] [address]   Sodium 08/14/2019 139  135 - 145 mmol/L Final    Potassium 08/14/2019 4.6  3.5 - 5.1 mmol/L Final   Chloride 08/14/2019 104  98 - 111 mmol/L Final   CO2 08/14/2019 25  22 - 32 mmol/L Final   Glucose, Bld 08/14/2019 120* 70 - 99 mg/dL Final   BUN 66/44/0347 19  6 - 20 mg/dL Final   Creatinine, Ser 08/14/2019 0.88  0.61 - 1.24 mg/dL Final   Calcium 42/59/5638 9.2  8.9 - 10.3 mg/dL Final   Total Protein 75/64/3329 7.6  6.5 - 8.1 g/dL Final   Albumin 51/88/4166 4.3  3.5 - 5.0 g/dL Final   AST 05/04/1600 22  15 - 41 U/L Final   ALT 08/14/2019 28  0 - 44 U/L Final   Alkaline Phosphatase 08/14/2019 68  38 - 126 U/L Final   Total Bilirubin 08/14/2019 1.1  0.3 - 1.2 mg/dL Final   GFR calc non Af Amer 08/14/2019 >60  >60 mL/min Final   GFR calc Af Amer 08/14/2019 >60  >60 mL/min Final   Anion gap 08/14/2019 10  5 - 15 Final   Performed at [facility], [address] [place], [place] [address]   Prothrombin Time 08/14/2019 11.7  11.4 - 15.2 seconds Final   INR 08/14/2019 0.9  0.8 - 1.2 Final   Comment: (NOTE) INR goal varies based on device and disease states. Performed at [facility], [address], [place], [place] [address]    ABO/RH(D) 08/14/2019 A POS   Final   Antibody Screen 08/14/2019 NEG   Final   Sample Expiration 08/14/2019 08/22/2019,2359   Final   Extend sample reason 08/14/2019    Final                   Value:NO TRANSFUSIONS OR PREGNANCY IN THE PAST 3 MONTHS Performed at [facility], [address], [place], [place] [address]    MRSA, PCR 08/14/2019 NEGATIVE  NEGATIVE Final   Staphylococcus aureus 08/14/2019 NEGATIVE  NEGATIVE Final   Comment: (NOTE) The Xpert SA Assay (FDA approved for NASAL specimens in patients 54 years of age and older), is one component of a comprehensive surveillance program. It is not intended to diagnose infection nor to guide or monitor treatment. Performed at [facility], [address] [place], [place] [address]    ABO/RH(D) 08/14/2019    Final                   Value:A POS Performed at [facility], [address] [place], [place] [address]      X-Rays:Vas [place] Lower Extremity Venous (dvt)  Result Date: 08/22/2019  Lower Venous Study Indications: Pain, Swelling, and s/p right total knee arthroplasty.  Limitations: Body habitus, poor ultrasound/tissue interface, bandages and patient position. Comparison Study: No prior study. Performing Technologist: Gertie Fey MHA, RDMS, RVT, RDCS  Examination Guidelines: A complete evaluation includes B-mode imaging,  spectral Doppler, color Doppler, and power Doppler as needed of all accessible portions of each vessel. Bilateral testing is considered an integral part of a complete examination. Limited examinations for reoccurring indications may be performed as noted.  +-------+---------------+---------+-----------+----------+--------------+  RIGHT   Compressibility Phasicity Spontaneity Properties Thrombus Aging  +-------+---------------+---------+-----------+----------+--------------+  CFV     Full            Yes       Yes                                    +-------+---------------+---------+-----------+----------+--------------+  SFJ     Full                                                             +-------+---------------+---------+-----------+----------+--------------+  FV Prox Full                                                             +-------+---------------+---------+-----------+----------+--------------+  FV Mid  Full                                                             +-------+---------------+---------+-----------+----------+--------------+  PFV     Full                                                             +-------+---------------+---------+-----------+----------+--------------+  POP     Full            Yes       Yes                                     +-------+---------------+---------+-----------+----------+--------------+  PTV     Full                                                             +-------+---------------+---------+-----------+----------+--------------+  PERO    Full                                                             +-------+---------------+---------+-----------+----------+--------------+  Unable to visualize left CFV due to patient position.  Summary: Right: There is no evidence of deep vein thrombosis in  the lower extremity. However, portions of this examination were limited- see technologist comments above. No cystic structure found in the popliteal fossa.  *See table(s) above for measurements and observations. Electronically signed by Coral Else MD on 08/22/2019 at 12:44:41 AM.    Final     EKG: Orders placed or performed during the hospital encounter of 01/21/19   EKG   EKG     Hospital Course: CURTICE VENKATESAN is a 53 y.o. who was admitted to [facility]. They were brought to the operating room on 08/19/2019 and underwent Procedure(s): TOTAL KNEE ARTHROPLASTY.  Patient tolerated the procedure well and was later transferred to the recovery room and then to the orthopaedic floor for postoperative care.  They were given PO and IV analgesics for pain control following their surgery.  They were given 24 hours of postoperative antibiotics of  Anti-infectives (From admission, onward)   Start     Dose/Rate Route Frequency Ordered Stop   08/19/19 2000  vancomycin (VANCOCIN) IVPB 1000 mg/200 mL premix     1,000 mg 200 mL/hr over 60 Minutes Intravenous Every 12 hours 08/19/19 1215 08/19/19 2052   08/19/19 0953  polymyxin B 500,000 Units, bacitracin 50,000 Units in sodium chloride 0.9 % 500 mL irrigation  Status:  Discontinued       As needed 08/19/19 0953 08/19/19 1045   08/19/19 0800  vancomycin (VANCOCIN) 1,000 mg in sodium chloride 0.9 % 250 mL IVPB  Status:  Discontinued     1,000 mg 250 mL/hr over 60  Minutes Intravenous  Once 08/19/19 0746 08/19/19 0749   08/19/19 0800  vancomycin (VANCOCIN) 500 mg in sodium chloride 0.9 % 100 mL IVPB     500 mg 100 mL/hr over 60 Minutes Intravenous STAT 08/19/19 0750 08/19/19 0900   08/19/19 0800  vancomycin (VANCOCIN) IVPB 1000 mg/200 mL premix     1,000 mg 200 mL/hr over 60 Minutes Intravenous STAT 08/19/19 0751 08/19/19 0830   08/19/19 0745  vancomycin (VANCOCIN) 1,000 mg in sodium chloride 0.9 % 250 mL IVPB  Status:  Discontinued     1,000 mg 250 mL/hr over 60 Minutes Intravenous  Once 08/19/19 0746 08/19/19 0750   08/19/19 0645  vancomycin (VANCOCIN) 1,500 mg in sodium chloride 0.9 % 500 mL IVPB  Status:  Discontinued     1,500 mg 250 mL/hr over 120 Minutes Intravenous On call to O.R. 08/19/19 0640 08/19/19 0741   08/19/19 0644  vancomycin (VANCOCIN) 1-5 GM/200ML-% IVPB    Note to Pharmacy: Linard Millers   : cabinet override      08/19/19 0644 08/19/19 0730     and started on DVT prophylaxis in the form of Xarelto.   PT and OT were ordered for total joint protocol.  Discharge planning consulted to help with postop disposition and equipment needs.  Patient had a fair night on the evening of surgery.  They started to get up OOB with therapy on Dominguez one. Hemovac drain was pulled without difficulty. Had a difficult night post op night 2 with severe swelling and pain in the right leg. DVT and compartment syndrome ruled out.  Continued to work with therapy into Dominguez two.  Dressing was changed on Dominguez two and the incision was clean and dry. Continued to improve with PT post op Dominguez 3 and 4.  By Dominguez five, the patient had progressed with therapy and meeting their goals.  Incision was healing well.  Patient was seen in rounds and was ready to go  home.   Diet: Cardiac diet Activity:WBAT Follow-up:in 2 weeks Disposition - Home Discharged Condition: stable   Discharge Instructions    Call MD / Call 911   Complete by: As directed    If you experience chest  pain or shortness of breath, CALL 911 and be transported to the hospital emergency room.  If you develope a fever above 101 F, pus (white drainage) or increased drainage or redness at the wound, or calf pain, call your surgeon's office.   Constipation Prevention   Complete by: As directed    Drink plenty of fluids.  Prune juice may be helpful.  You may use a stool softener, such as Colace (over the counter) 100 mg twice a Dominguez.  Use MiraLax (over the counter) for constipation as needed.   Diet - low sodium heart healthy   Complete by: As directed    Discharge instructions   Complete by: As directed    Dr. Ranee Gosselin [facility] [address] [place], [place] [address] [phone]  TOTAL KNEE REPLACEMENT POSTOPERATIVE DIRECTIONS  Knee Rehabilitation, Guidelines Following Surgery  Results after knee surgery are often greatly improved when you follow the exercise, range of motion and muscle strengthening exercises prescribed by your doctor. Safety measures are also important to protect the knee from further injury. Any time any of these exercises cause you to have increased pain or swelling in your knee joint, decrease the amount until you are comfortable again and slowly increase them. If you have problems or questions, call your caregiver or physical therapist for advice.   HOME CARE INSTRUCTIONS  Remove items at home which could result in a fall. This includes throw rugs or furniture in walking pathways.  ICE to the affected knee every three hours for 30 minutes at a time and then as needed for pain and swelling.  Continue to use ice on the knee for pain and swelling from surgery. You may notice swelling that will progress down to the foot and ankle.  This is normal after surgery.  Elevate the leg when you are not up walking on it.   Continue to use the breathing machine which will help keep your temperature down.  It is common for your temperature to cycle up and down  following surgery, especially at night when you are not up moving around and exerting yourself.  The breathing machine keeps your lungs expanded and your temperature down. Do not place pillow under knee, focus on keeping the knee straight while resting   DIET You may resume your previous home diet once your are discharged from the hospital.  DRESSING / WOUND CARE / SHOWERING You may shower 3 days after surgery, but keep the wounds dry during showering.  You may use an occlusive plastic wrap (Press'n Seal for example), NO SOAKING/SUBMERGING IN THE BATHTUB.  If the bandage gets wet, change with a clean dry gauze.  If the incision gets wet, pat the wound dry with a clean towel. You may start showering once you are discharged home but do not submerge the incision under water. Just pat the incision dry and apply a dry gauze dressing on daily. Change the surgical dressing daily and reapply a dry dressing each time.  ACTIVITY Walk with your [place] as instructed. Use [place] as long as suggested by your caregivers. Avoid periods of inactivity such as sitting longer than an hour when not asleep. This helps prevent blood clots.  You may resume a sexual relationship in  one month or when given the OK by your doctor.  You may return to work once you are cleared by your doctor.  Do not drive a car for 6 weeks or until released by you surgeon.  Do not drive while taking narcotics.  WEIGHT BEARING Weight bearing as tolerated with assist device ([place], cane, etc) as directed, use it as long as suggested by your surgeon or therapist, typically at least 4-6 weeks.  POSTOPERATIVE CONSTIPATION PROTOCOL Constipation - defined medically as fewer than three stools per week and severe constipation as less than one stool per week.  One of the most common issues patients have following surgery is constipation.  Even if you have a regular bowel pattern at home, your normal regimen is likely to be disrupted due to  multiple reasons following surgery.  Combination of anesthesia, postoperative narcotics, change in appetite and fluid intake all can affect your bowels.  In order to avoid complications following surgery, here are some recommendations in order to help you during your recovery period.  Colace (docusate) - Pick up an over-the-counter form of Colace or another stool softener and take twice a Dominguez as long as you are requiring postoperative pain medications.  Take with a full glass of water daily.  If you experience loose stools or diarrhea, hold the colace until you stool forms back up.  If your symptoms do not get better within 1 week or if they get worse, check with your doctor.  Dulcolax (bisacodyl) - Pick up over-the-counter and take as directed by the product packaging as needed to assist with the movement of your bowels.  Take with a full glass of water.  Use this product as needed if not relieved by Colace only.   MiraLax (polyethylene glycol) - Pick up over-the-counter to have on hand.  MiraLax is a solution that will increase the amount of water in your bowels to assist with bowel movements.  Take as directed and can mix with a glass of water, juice, soda, coffee, or tea.  Take if you go more than two days without a movement. Do not use MiraLax more than once per Dominguez. Call your doctor if you are still constipated or irregular after using this medication for 7 days in a row.  If you continue to have problems with postoperative constipation, please contact the office for further assistance and recommendations.  If you experience "the worst abdominal pain ever" or develop nausea or vomiting, please contact the office immediatly for further recommendations for treatment.  ITCHING  If you experience itching with your medications, try taking only a single pain pill, or even half a pain pill at a time.  You can also use Benadryl over the counter for itching or also to help with sleep.   TED HOSE  STOCKINGS Wear the elastic stockings on both legs for three weeks following surgery during the Dominguez but you may remove then at night for sleeping.  MEDICATIONS See your medication summary on the "After Visit Summary" that the nursing staff will review with you prior to discharge.  You may have some home medications which will be placed on hold until you complete the course of blood thinner medication.  It is important for you to complete the blood thinner medication as prescribed by your surgeon.  Continue your approved medications as instructed at time of discharge.  Gabapentin 300 mg Protocol Take a 300 mg capsule three times a Dominguez for two weeks, Then a 300 mg capsule twice  a Dominguez for two weeks, Then a 300 mg capsule once a Dominguez for two weeks, then discontinue the Gabapentin.   PRECAUTIONS If you experience chest pain or shortness of breath - call 911 immediately for transfer to the hospital emergency department.  If you develop a fever greater that 101 F, purulent drainage from wound, increased redness or drainage from wound, foul odor from the wound/dressing, or calf pain - CONTACT YOUR SURGEON.                                                   FOLLOW-UP APPOINTMENTS Make sure you keep all of your appointments after your operation with your surgeon and caregivers. You should call the office at the above phone number and make an appointment for approximately two weeks after the date of your surgery or on the date instructed by your surgeon outlined in the "After Visit Summary".   RANGE OF MOTION AND STRENGTHENING EXERCISES  Rehabilitation of the knee is important following a knee injury or an operation. After just a few days of immobilization, the muscles of the thigh which control the knee become weakened and shrink (atrophy). Knee exercises are designed to build up the tone and strength of the thigh muscles and to improve knee motion. Often times heat used for twenty to thirty minutes before  working out will loosen up your tissues and help with improving the range of motion but do not use heat for the first two weeks following surgery. These exercises can be done on a training (exercise) mat, on the floor, on a table or on a bed. Use what ever works the best and is most comfortable for you Knee exercises include:  Leg Lifts - While your knee is still immobilized in a splint or cast, you can do straight leg raises. Lift the leg to 60 degrees, hold for 3 sec, and slowly lower the leg. Repeat 10-20 times 2-3 times daily. Perform this exercise against resistance later as your knee gets better.  Quad and Hamstring Sets - Tighten up the muscle on the front of the thigh (Quad) and hold for 5-10 sec. Repeat this 10-20 times hourly. Hamstring sets are done by pushing the foot backward against an object and holding for 5-10 sec. Repeat as with quad sets.  Leg Slides: Lying on your back, slowly slide your foot toward your buttocks, bending your knee up off the floor (only go as far as is comfortable). Then slowly slide your foot back down until your leg is flat on the floor again. Angel Wings: Lying on your back spread your legs to the side as far apart as you can without causing discomfort.  A rehabilitation program following serious knee injuries can speed recovery and prevent re-injury in the future due to weakened muscles. Contact your doctor or a physical therapist for more information on knee rehabilitation.   IF YOU ARE TRANSFERRED TO A SKILLED REHAB FACILITY If the patient is transferred to a skilled rehab facility following release from the hospital, a list of the current medications will be sent to the facility for the patient to continue.  When discharged from the skilled rehab facility, please have the facility set up the patient's Home Health Physical Therapy prior to being released. Also, the skilled facility will be responsible for providing the patient with their medications at  time of  release from the facility to include their pain medication, the muscle relaxants, and their blood thinner medication. If the patient is still at the rehab facility at time of the two week follow up appointment, the skilled rehab facility will also need to assist the patient in arranging follow up appointment in our office and any transportation needs.  MAKE SURE YOU:  Understand these instructions.  Get help right away if you are not doing well or get worse.    Pick up stool softner and laxative for home use following surgery while on pain medications. Do not submerge incision under water. Please use good hand washing techniques while changing dressing each Dominguez. May shower starting three days after surgery. Please use a clean towel to pat the incision dry following showers. Continue to use ice for pain and swelling after surgery. Do not use any lotions or creams on the incision until instructed by your surgeon.   Increase activity slowly as tolerated   Complete by: As directed      Allergies as of 08/24/2019      Reactions   Contrast Media [iodinated Diagnostic Agents] Other (See Comments), Cough   Uncontrollable gagging/coughing   Lisinopril Cough   Penicillins Other (See Comments)   Did it involve swelling of the face/tongue/throat, SOB, or low BP? Unknown Did it involve sudden or severe rash/hives, skin peeling, or any reaction on the inside of your mouth or nose? Unknown Did you need to seek medical attention at a hospital or doctor's office? Yes When did it last happen?Infantile Reaction If all above answers are NO, may proceed with cephalosporin use. Childhood reaction      Medication List    STOP taking these medications   aspirin EC 325 MG tablet   methocarbamol 500 MG tablet Commonly known as: Robaxin     TAKE these medications   albuterol 108 (90 Base) MCG/ACT inhaler Commonly known as: VENTOLIN HFA Inhale 2 puffs into the lungs every 4 (four) hours as  needed for wheezing or shortness of breath.   cholecalciferol 25 MCG (1000 UT) tablet Commonly known as: VITAMIN D Take 1,000 Units by mouth daily.   cyclobenzaprine 10 MG tablet Commonly known as: FLEXERIL Take 1 tablet (10 mg total) by mouth 3 (three) times daily.   fluticasone 50 MCG/ACT nasal spray Commonly known as: FLONASE Place 2 sprays into both nostrils daily. What changed:   when to take this  reasons to take this   gabapentin 300 MG capsule Commonly known as: NEURONTIN Take a 300 mg capsule three times a Dominguez for two weeks Then a 300 mg capsule twice a Dominguez for two weeks Then a 300 mg capsule once a Dominguez for two weeks   HYDROmorphone 4 MG tablet Commonly known as: DILAUDID Take 1 tablet (4 mg total) by mouth every 4 (four) hours as needed for severe pain.   Krill Oil 350 MG Caps Take 350 mg by mouth daily.   losartan-hydrochlorothiazide 100-25 MG tablet Commonly known as: HYZAAR Take 1 tablet by mouth daily.   MAGNESIUM PO Take 400 mg by mouth daily.   metoprolol succinate 100 MG 24 hr tablet Commonly known as: TOPROL-XL TAKE 1 TABLET BY MOUTH EVERY Dominguez WITH OR FOLLOWING A MEAL What changed: See the new instructions.   rivaroxaban 10 MG Tabs tablet Commonly known as: XARELTO Take 1 tablet (10 mg total) by mouth daily with breakfast.   SUPER B COMPLEX/C PO Take 1 capsule by mouth daily.  triamcinolone cream 0.1 % Commonly known as: KENALOG Apply 1 application topically 2 (two) times daily. What changed:   when to take this  reasons to take this   VITAMIN C PO Take 1,000 mg by mouth daily.      Follow-up Information    Ranee Gosselin, MD. Schedule an appointment as soon as possible for a visit on 09/01/2019.   Specialty: Orthopedic Surgery Contact information: [address] [place] [address] [phone]        [facility] Follow up.   Specialty: Physical Therapy Contact information: [facility]  Physical Therapy [address] [place] [address] [phone]           Signed: Dimitri Ped, PA-C Orthopaedic Surgery 08/25/2019, 7:52 AM

## 2019-08-26 DIAGNOSIS — Z5189 Encounter for other specified aftercare: Secondary | ICD-10-CM | POA: Diagnosis not present

## 2019-09-02 DIAGNOSIS — Z96651 Presence of right artificial knee joint: Secondary | ICD-10-CM | POA: Diagnosis not present

## 2019-09-02 DIAGNOSIS — R269 Unspecified abnormalities of gait and mobility: Secondary | ICD-10-CM | POA: Diagnosis not present

## 2019-09-02 DIAGNOSIS — M25661 Stiffness of right knee, not elsewhere classified: Secondary | ICD-10-CM | POA: Diagnosis not present

## 2019-09-02 DIAGNOSIS — M25561 Pain in right knee: Secondary | ICD-10-CM | POA: Diagnosis not present

## 2019-09-04 DIAGNOSIS — M25561 Pain in right knee: Secondary | ICD-10-CM | POA: Diagnosis not present

## 2019-09-04 DIAGNOSIS — Z96651 Presence of right artificial knee joint: Secondary | ICD-10-CM | POA: Diagnosis not present

## 2019-09-04 DIAGNOSIS — R269 Unspecified abnormalities of gait and mobility: Secondary | ICD-10-CM | POA: Diagnosis not present

## 2019-09-04 DIAGNOSIS — M25661 Stiffness of right knee, not elsewhere classified: Secondary | ICD-10-CM | POA: Diagnosis not present

## 2019-09-07 DIAGNOSIS — Z96651 Presence of right artificial knee joint: Secondary | ICD-10-CM | POA: Diagnosis not present

## 2019-09-07 DIAGNOSIS — M25661 Stiffness of right knee, not elsewhere classified: Secondary | ICD-10-CM | POA: Diagnosis not present

## 2019-09-07 DIAGNOSIS — M25561 Pain in right knee: Secondary | ICD-10-CM | POA: Diagnosis not present

## 2019-09-07 DIAGNOSIS — R269 Unspecified abnormalities of gait and mobility: Secondary | ICD-10-CM | POA: Diagnosis not present

## 2019-09-09 DIAGNOSIS — R269 Unspecified abnormalities of gait and mobility: Secondary | ICD-10-CM | POA: Diagnosis not present

## 2019-09-09 DIAGNOSIS — Z96651 Presence of right artificial knee joint: Secondary | ICD-10-CM | POA: Diagnosis not present

## 2019-09-09 DIAGNOSIS — M25661 Stiffness of right knee, not elsewhere classified: Secondary | ICD-10-CM | POA: Diagnosis not present

## 2019-09-09 DIAGNOSIS — M25561 Pain in right knee: Secondary | ICD-10-CM | POA: Diagnosis not present

## 2019-09-11 DIAGNOSIS — R269 Unspecified abnormalities of gait and mobility: Secondary | ICD-10-CM | POA: Diagnosis not present

## 2019-09-11 DIAGNOSIS — M25661 Stiffness of right knee, not elsewhere classified: Secondary | ICD-10-CM | POA: Diagnosis not present

## 2019-09-11 DIAGNOSIS — Z96651 Presence of right artificial knee joint: Secondary | ICD-10-CM | POA: Diagnosis not present

## 2019-09-11 DIAGNOSIS — M25561 Pain in right knee: Secondary | ICD-10-CM | POA: Diagnosis not present

## 2019-09-14 DIAGNOSIS — M25661 Stiffness of right knee, not elsewhere classified: Secondary | ICD-10-CM | POA: Diagnosis not present

## 2019-09-14 DIAGNOSIS — R269 Unspecified abnormalities of gait and mobility: Secondary | ICD-10-CM | POA: Diagnosis not present

## 2019-09-14 DIAGNOSIS — Z96651 Presence of right artificial knee joint: Secondary | ICD-10-CM | POA: Diagnosis not present

## 2019-09-14 DIAGNOSIS — M25561 Pain in right knee: Secondary | ICD-10-CM | POA: Diagnosis not present

## 2019-09-18 DIAGNOSIS — M25661 Stiffness of right knee, not elsewhere classified: Secondary | ICD-10-CM | POA: Diagnosis not present

## 2019-09-18 DIAGNOSIS — Z96651 Presence of right artificial knee joint: Secondary | ICD-10-CM | POA: Diagnosis not present

## 2019-09-18 DIAGNOSIS — R269 Unspecified abnormalities of gait and mobility: Secondary | ICD-10-CM | POA: Diagnosis not present

## 2019-09-18 DIAGNOSIS — M25561 Pain in right knee: Secondary | ICD-10-CM | POA: Diagnosis not present

## 2019-09-22 DIAGNOSIS — M25561 Pain in right knee: Secondary | ICD-10-CM | POA: Diagnosis not present

## 2019-09-22 DIAGNOSIS — R269 Unspecified abnormalities of gait and mobility: Secondary | ICD-10-CM | POA: Diagnosis not present

## 2019-09-22 DIAGNOSIS — Z96651 Presence of right artificial knee joint: Secondary | ICD-10-CM | POA: Diagnosis not present

## 2019-09-22 DIAGNOSIS — M25661 Stiffness of right knee, not elsewhere classified: Secondary | ICD-10-CM | POA: Diagnosis not present

## 2019-09-24 DIAGNOSIS — Z96651 Presence of right artificial knee joint: Secondary | ICD-10-CM | POA: Diagnosis not present

## 2019-09-24 DIAGNOSIS — M25561 Pain in right knee: Secondary | ICD-10-CM | POA: Diagnosis not present

## 2019-09-24 DIAGNOSIS — M25661 Stiffness of right knee, not elsewhere classified: Secondary | ICD-10-CM | POA: Diagnosis not present

## 2019-09-24 DIAGNOSIS — R269 Unspecified abnormalities of gait and mobility: Secondary | ICD-10-CM | POA: Diagnosis not present

## 2019-09-30 ENCOUNTER — Encounter (HOSPITAL_BASED_OUTPATIENT_CLINIC_OR_DEPARTMENT_OTHER): Payer: Self-pay | Admitting: *Deleted

## 2019-09-30 ENCOUNTER — Other Ambulatory Visit: Payer: Self-pay

## 2019-09-30 NOTE — Progress Notes (Signed)
Spoke with patient for pre op interview via telephone. Patient will need CBC and CMP AM of surgery. Patient to take Gabapentin, Flonase and Dilaudid PRN AM of surgery with a sip of water. Patient is on Xarelto MD told patient to hold starting day before surgery.  Current EKG in Epic.Arrival time 70.

## 2019-10-03 ENCOUNTER — Other Ambulatory Visit (HOSPITAL_COMMUNITY)
Admission: RE | Admit: 2019-10-03 | Discharge: 2019-10-03 | Disposition: A | Discharge status: Home / Self Care | Payer: BC Managed Care – PPO | Source: Ambulatory Visit | Attending: Orthopedic Surgery | Admitting: Orthopedic Surgery

## 2019-10-03 DIAGNOSIS — Z20828 Contact with and (suspected) exposure to other viral communicable diseases: Secondary | ICD-10-CM | POA: Diagnosis not present

## 2019-10-03 DIAGNOSIS — Z01812 Encounter for preprocedural laboratory examination: Secondary | ICD-10-CM | POA: Insufficient documentation

## 2019-10-04 LAB — NOVEL CORONAVIRUS, NAA (HOSP ORDER, SEND-OUT TO REF LAB; TAT 18-24 HRS): SARS-CoV-2, NAA: NOT DETECTED

## 2019-10-07 ENCOUNTER — Ambulatory Visit (HOSPITAL_BASED_OUTPATIENT_CLINIC_OR_DEPARTMENT_OTHER)
Admission: RE | Admit: 2019-10-07 | Discharge: 2019-10-07 | Disposition: A | Discharge status: Home / Self Care | Payer: BC Managed Care – PPO | Attending: Orthopedic Surgery | Admitting: Orthopedic Surgery

## 2019-10-07 ENCOUNTER — Ambulatory Visit (HOSPITAL_BASED_OUTPATIENT_CLINIC_OR_DEPARTMENT_OTHER): Payer: BC Managed Care – PPO | Admitting: Anesthesiology

## 2019-10-07 ENCOUNTER — Encounter (HOSPITAL_BASED_OUTPATIENT_CLINIC_OR_DEPARTMENT_OTHER): Payer: Self-pay | Admitting: *Deleted

## 2019-10-07 ENCOUNTER — Encounter (HOSPITAL_BASED_OUTPATIENT_CLINIC_OR_DEPARTMENT_OTHER)
Admission: RE | Disposition: A | Discharge status: Home / Self Care | Payer: Self-pay | Source: Home / Self Care | Attending: Orthopedic Surgery

## 2019-10-07 ENCOUNTER — Other Ambulatory Visit: Payer: Self-pay

## 2019-10-07 DIAGNOSIS — E785 Hyperlipidemia, unspecified: Secondary | ICD-10-CM | POA: Diagnosis not present

## 2019-10-07 DIAGNOSIS — T8489XA Other specified complication of internal orthopedic prosthetic devices, implants and grafts, initial encounter: Secondary | ICD-10-CM | POA: Insufficient documentation

## 2019-10-07 DIAGNOSIS — Z79899 Other long term (current) drug therapy: Secondary | ICD-10-CM | POA: Diagnosis not present

## 2019-10-07 DIAGNOSIS — R739 Hyperglycemia, unspecified: Secondary | ICD-10-CM | POA: Diagnosis not present

## 2019-10-07 DIAGNOSIS — T8482XA Fibrosis due to internal orthopedic prosthetic devices, implants and grafts, initial encounter: Secondary | ICD-10-CM | POA: Diagnosis not present

## 2019-10-07 DIAGNOSIS — Z8589 Personal history of malignant neoplasm of other organs and systems: Secondary | ICD-10-CM | POA: Diagnosis not present

## 2019-10-07 DIAGNOSIS — M24561 Contracture, right knee: Secondary | ICD-10-CM | POA: Diagnosis not present

## 2019-10-07 DIAGNOSIS — Z96651 Presence of right artificial knee joint: Secondary | ICD-10-CM | POA: Insufficient documentation

## 2019-10-07 DIAGNOSIS — Y831 Surgical operation with implant of artificial internal device as the cause of abnormal reaction of the patient, or of later complication, without mention of misadventure at the time of the procedure: Secondary | ICD-10-CM | POA: Diagnosis not present

## 2019-10-07 DIAGNOSIS — I10 Essential (primary) hypertension: Secondary | ICD-10-CM | POA: Diagnosis not present

## 2019-10-07 HISTORY — PX: KNEE CLOSED REDUCTION: SHX995

## 2019-10-07 LAB — COMPREHENSIVE METABOLIC PANEL
ALT: 47 U/L — ABNORMAL HIGH (ref 0–44)
AST: 33 U/L (ref 15–41)
Albumin: 4.1 g/dL (ref 3.5–5.0)
Alkaline Phosphatase: 74 U/L (ref 38–126)
Anion gap: 12 (ref 5–15)
BUN: 16 mg/dL (ref 6–20)
CO2: 22 mmol/L (ref 22–32)
Calcium: 9.2 mg/dL (ref 8.9–10.3)
Chloride: 105 mmol/L (ref 98–111)
Creatinine, Ser: 0.92 mg/dL (ref 0.61–1.24)
GFR calc Af Amer: >60 mL/min (ref >60)
GFR calc non Af Amer: >60 mL/min (ref >60)
Glucose, Bld: 106 mg/dL — ABNORMAL HIGH (ref 70–99)
Potassium: 3.9 mmol/L (ref 3.5–5.1)
Sodium: 139 mmol/L (ref 135–145)
Total Bilirubin: 0.7 mg/dL (ref 0.3–1.2)
Total Protein: 7.7 g/dL (ref 6.5–8.1)

## 2019-10-07 LAB — CBC WITH DIFFERENTIAL/PLATELET
Abs Immature Granulocytes: 0.02 10*3/uL (ref 0.00–0.07)
Basophils Absolute: 0.1 10*3/uL (ref 0.0–0.1)
Basophils Relative: 1 %
Eosinophils Absolute: 0.5 10*3/uL (ref 0.0–0.5)
Eosinophils Relative: 7 %
HCT: 40.9 % (ref 39.0–52.0)
Hemoglobin: 12.8 g/dL — ABNORMAL LOW (ref 13.0–17.0)
Immature Granulocytes: 0 %
Lymphocytes Relative: 28 %
Lymphs Abs: 2.1 10*3/uL (ref 0.7–4.0)
MCH: 27.2 pg (ref 26.0–34.0)
MCHC: 31.3 g/dL (ref 30.0–36.0)
MCV: 87 fL (ref 80.0–100.0)
Monocytes Absolute: 0.8 10*3/uL (ref 0.1–1.0)
Monocytes Relative: 10 %
Neutro Abs: 4.1 10*3/uL (ref 1.7–7.7)
Neutrophils Relative %: 54 %
Platelets: 371 10*3/uL (ref 150–400)
RBC: 4.7 MIL/uL (ref 4.22–5.81)
RDW: 13.3 % (ref 11.5–15.5)
WBC: 7.7 10*3/uL (ref 4.0–10.5)
nRBC: 0 % (ref 0.0–0.2)

## 2019-10-07 SURGERY — MANIPULATION, KNEE, CLOSED
Anesthesia: General | Site: Knee | Laterality: Right

## 2019-10-07 MED ORDER — LIDOCAINE 2% (20 MG/ML) 5 ML SYRINGE
INTRAMUSCULAR | Status: AC
  Filled 2019-10-07: qty 5

## 2019-10-07 MED ORDER — LACTATED RINGERS IV SOLN
INTRAVENOUS | Status: DC
  Administered 2019-10-07 (×2): via INTRAVENOUS
  Filled 2019-10-07: qty 1000

## 2019-10-07 MED ORDER — FENTANYL CITRATE (PF) 100 MCG/2ML IJ SOLN
INTRAMUSCULAR | Status: AC
  Filled 2019-10-07: qty 2

## 2019-10-07 MED ORDER — ONDANSETRON HCL 4 MG/2ML IJ SOLN
4.0000 mg | Freq: Once | INTRAMUSCULAR | Status: AC | PRN
  Administered 2019-10-07: 4 mg via INTRAVENOUS
  Filled 2019-10-07: qty 2

## 2019-10-07 MED ORDER — OXYCODONE HCL 5 MG/5ML PO SOLN
5.0000 mg | Freq: Once | ORAL | Status: AC | PRN
  Filled 2019-10-07: qty 5

## 2019-10-07 MED ORDER — FENTANYL CITRATE (PF) 100 MCG/2ML IJ SOLN
INTRAMUSCULAR | Status: DC | PRN
  Administered 2019-10-07 (×6): 50 ug via INTRAVENOUS

## 2019-10-07 MED ORDER — PROPOFOL 10 MG/ML IV BOLUS
INTRAVENOUS | Status: AC
  Filled 2019-10-07: qty 20

## 2019-10-07 MED ORDER — MIDAZOLAM HCL 2 MG/2ML IJ SOLN
INTRAMUSCULAR | Status: AC
  Filled 2019-10-07: qty 2

## 2019-10-07 MED ORDER — LACTATED RINGERS IV SOLN
INTRAVENOUS | Status: DC
  Filled 2019-10-07: qty 1000

## 2019-10-07 MED ORDER — ACETAMINOPHEN 325 MG PO TABS
325.0000 mg | ORAL_TABLET | ORAL | Status: DC | PRN
  Filled 2019-10-07: qty 2

## 2019-10-07 MED ORDER — OXYCODONE HCL 5 MG PO TABS
ORAL_TABLET | ORAL | Status: AC
  Filled 2019-10-07: qty 1

## 2019-10-07 MED ORDER — ACETAMINOPHEN 500 MG PO TABS
ORAL_TABLET | ORAL | Status: AC
  Filled 2019-10-07: qty 2

## 2019-10-07 MED ORDER — MEPERIDINE HCL 25 MG/ML IJ SOLN
6.2500 mg | INTRAMUSCULAR | Status: DC | PRN
  Filled 2019-10-07: qty 1

## 2019-10-07 MED ORDER — SUCCINYLCHOLINE CHLORIDE 20 MG/ML IJ SOLN
INTRAMUSCULAR | Status: DC | PRN
  Administered 2019-10-07: 40 mg via INTRAVENOUS

## 2019-10-07 MED ORDER — OXYCODONE HCL 5 MG PO TABS
5.0000 mg | ORAL_TABLET | Freq: Once | ORAL | Status: AC | PRN
  Administered 2019-10-07: 5 mg via ORAL
  Filled 2019-10-07: qty 1

## 2019-10-07 MED ORDER — LIDOCAINE 2% (20 MG/ML) 5 ML SYRINGE
INTRAMUSCULAR | Status: DC | PRN
  Administered 2019-10-07: 80 mg via INTRAVENOUS

## 2019-10-07 MED ORDER — MIDAZOLAM HCL 2 MG/2ML IJ SOLN
INTRAMUSCULAR | Status: DC | PRN
  Administered 2019-10-07: 2 mg via INTRAVENOUS

## 2019-10-07 MED ORDER — FENTANYL CITRATE (PF) 100 MCG/2ML IJ SOLN
25.0000 ug | INTRAMUSCULAR | Status: DC | PRN
  Administered 2019-10-07 (×2): 50 ug via INTRAVENOUS
  Filled 2019-10-07: qty 1

## 2019-10-07 MED ORDER — KETOROLAC TROMETHAMINE 30 MG/ML IJ SOLN
30.0000 mg | Freq: Once | INTRAMUSCULAR | Status: DC | PRN
  Filled 2019-10-07: qty 1

## 2019-10-07 MED ORDER — HYDROMORPHONE HCL 1 MG/ML IJ SOLN
0.2500 mg | INTRAMUSCULAR | Status: DC | PRN
  Administered 2019-10-07: 0.5 mg via INTRAVENOUS
  Filled 2019-10-07: qty 0.5

## 2019-10-07 MED ORDER — ONDANSETRON HCL 4 MG/2ML IJ SOLN
INTRAMUSCULAR | Status: AC
  Filled 2019-10-07: qty 2

## 2019-10-07 MED ORDER — PROPOFOL 10 MG/ML IV BOLUS
INTRAVENOUS | Status: DC | PRN
  Administered 2019-10-07: 200 mg via INTRAVENOUS

## 2019-10-07 MED ORDER — LACTATED RINGERS IV SOLN
INTRAVENOUS | Status: DC | PRN
  Administered 2019-10-07: 09:00:00 via INTRAVENOUS

## 2019-10-07 MED ORDER — ACETAMINOPHEN 160 MG/5ML PO SOLN
325.0000 mg | ORAL | Status: DC | PRN
  Filled 2019-10-07: qty 20.3

## 2019-10-07 MED ORDER — ACETAMINOPHEN 325 MG PO TABS
ORAL_TABLET | ORAL | Status: DC | PRN
  Administered 2019-10-07: 1000 mg via ORAL

## 2019-10-07 MED ORDER — KETAMINE HCL 10 MG/ML IJ SOLN
INTRAMUSCULAR | Status: DC | PRN
  Administered 2019-10-07: 10 mg via INTRAVENOUS
  Administered 2019-10-07: 20 mg via INTRAVENOUS

## 2019-10-07 MED ORDER — HYDROMORPHONE HCL 1 MG/ML IJ SOLN
INTRAMUSCULAR | Status: AC
  Filled 2019-10-07: qty 1

## 2019-10-07 SURGICAL SUPPLY — 19 items
BANDAGE ADHESIVE 1X3 (GAUZE/BANDAGES/DRESSINGS) ×1 IMPLANT
CLOTH BEACON ORANGE TIMEOUT ST (SAFETY) ×2 IMPLANT
GAUZE SPONGE 4X4 12PLY STRL (GAUZE/BANDAGES/DRESSINGS) ×1 IMPLANT
GLOVE BIO SURGEON STRL SZ8.5 (GLOVE) ×1 IMPLANT
GLOVE INDICATOR 8.5 STRL (GLOVE) ×1 IMPLANT
GLOVE SURG ORTHO 6.0 STRL STRW (GLOVE) ×1 IMPLANT
KIT TURNOVER CYSTO (KITS) ×1 IMPLANT
MANIFOLD NEPTUNE II (INSTRUMENTS) IMPLANT
NDL SAFETY ECLIPSE 18X1.5 (NEEDLE) ×1 IMPLANT
NDL SPNL 22GX3.5 QUINCKE BK (NEEDLE) IMPLANT
NEEDLE HYPO 18GX1.5 SHARP (NEEDLE)
NEEDLE SPNL 22GX3.5 QUINCKE BK (NEEDLE) IMPLANT
PACK ICE MAXI GEL EZY WRAP (MISCELLANEOUS) ×1 IMPLANT
SWAB ALCOHOL LATEX FREE (MISCELLANEOUS) IMPLANT
SWABSTICK POVIDONE IODINE SNGL (MISCELLANEOUS) ×2 IMPLANT
SYR 20ML LL LF (SYRINGE) IMPLANT
SYR CONTROL 10ML LL (SYRINGE) ×1 IMPLANT
TRAY DSU PREP LF (CUSTOM PROCEDURE TRAY) ×1 IMPLANT
TUBE CONNECTING 12X1/4 (SUCTIONS) IMPLANT

## 2019-10-07 NOTE — Transfer of Care (Signed)
Immediate Anesthesia Transfer of Care Note  Patient: Micheal Dominguez  Procedure(s) Performed: CLOSED MANIPULATION KNEE (Right Knee)  Patient Location: PACU  Anesthesia Type:General  Level of Consciousness: awake, alert , sedated and patient cooperative  Airway & Oxygen Therapy: Patient Spontanous Breathing and Patient connected to nasal cannula oxygen  Post-op Assessment: Report given to RN and Post -op Vital signs reviewed and stable  Post vital signs: Reviewed and stable  Last Vitals:  Vitals Value Taken Time  BP    Temp    Pulse    Resp    SpO2      Last Pain:  Vitals:   10/07/19 0905  TempSrc: Oral  PainSc: 6       Patients Stated Pain Goal: 5 ([id number])  Complications: No apparent anesthesia complications

## 2019-10-07 NOTE — Interval H&P Note (Signed)
History and Physical Interval Note:  10/07/2019 10:54 AM  Micheal Dominguez  has presented today for surgery, with the diagnosis of right knee arthrofibrosis.  The various methods of treatment have been discussed with the patient and family. After consideration of risks, benefits and other options for treatment, the patient has consented to  Procedure(s) with comments: CLOSED MANIPULATION KNEE (Right) - 50min as a surgical intervention.  The patient's history has been reviewed, patient examined, no change in status, stable for surgery.  I have reviewed the patient's chart and labs.  Questions were answered to the patient's satisfaction.     [name]

## 2019-10-07 NOTE — Anesthesia Preprocedure Evaluation (Addendum)
Anesthesia Evaluation  Patient identified by MRN, date of birth, ID band Patient awake    Reviewed: Allergy & Precautions, NPO status , Patient's Chart, lab work & pertinent test results, reviewed documented beta blocker date and time   Airway Mallampati: I       Dental no notable dental hx. (+) Teeth Intact   Pulmonary neg pulmonary ROS,    Pulmonary exam normal breath sounds clear to auscultation       Cardiovascular hypertension, Pt. on medications and Pt. on home beta blockers Normal cardiovascular exam Rhythm:Regular Rate:Normal     Neuro/Psych negative neurological ROS  negative psych ROS   GI/Hepatic negative GI ROS, Neg liver ROS,   Endo/Other  negative endocrine ROS  Renal/GU negative Renal ROS  negative genitourinary   Musculoskeletal negative musculoskeletal ROS (+)   Abdominal (+) + obese,   Peds  Hematology negative hematology ROS (+)   Anesthesia Other Findings Arthrofibrosis Rt knee S/P TKR  Reproductive/Obstetrics                           Anesthesia Physical Anesthesia Plan  ASA: II  Anesthesia Plan: General   Post-op Pain Management:    Induction: Intravenous  PONV Risk Score and Plan:   Airway Management Planned: LMA  Additional Equipment:   Intra-op Plan:   Post-operative Plan: Extubation in OR  Informed Consent:     Dental advisory given  Plan Discussed with: CRNA  Anesthesia Plan Comments:         Anesthesia Quick Evaluation

## 2019-10-07 NOTE — Anesthesia Postprocedure Evaluation (Signed)
Anesthesia Post Note  Patient: Micheal Dominguez  Procedure(s) Performed: CLOSED MANIPULATION KNEE (Right Knee)     Patient location during evaluation: Phase II Anesthesia Type: General Level of consciousness: awake and sedated Pain management: pain level controlled Vital Signs Assessment: post-procedure vital signs reviewed and stable Respiratory status: spontaneous breathing Cardiovascular status: stable Postop Assessment: no apparent nausea or vomiting Anesthetic complications: no    Last Vitals:  Vitals:   10/07/19 1215 10/07/19 1230  BP: (!) 165/90 (!) 160/93  Pulse: 67 (!) 59  Resp: 14 12  Temp:    SpO2: 100% 97%    Last Pain:  Vitals:   10/07/19 1230  TempSrc:   PainSc: 6    Pain Goal: Patients Stated Pain Goal: 5 (10/07/19 1220)                 Huston Foley

## 2019-10-07 NOTE — H&P (Signed)
Micheal Dominguez is an 53 y.o. male.   Chief Complaint: contracture of right total Knee. HPI: He developed a severe amount of swelling of his right Total knee  Past Medical History:  Diagnosis Date  . 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 Hypertension   . RBBB    Hx - no problems    Past Surgical History:  Procedure Laterality Date  . COLONOSCOPY  06/08/2016   per Dr. Henrene Pastor, no polyps, has diverticulosis and int. hemorrhoids, repeat in 10 yrs   . KNEE ARTHROSCOPY     right  . KNEE ARTHROSCOPY WITH MEDIAL MENISECTOMY Left 01/21/2019   Procedure: Left knee arthroscopy with medial menisectomy, debridement, chondroplasty;  Surgeon: [name], MD;  Location: [facility];  Service: Orthopedics;  Laterality: Left;  75min  . MANDIBLE SURGERY  09/25/2018   cancer- done at [facility] Right 08/19/2019   Procedure: TOTAL KNEE ARTHROPLASTY;  Surgeon: [name], MD;  Location: [facility];  Service: Orthopedics;  Laterality: Right;  18min    Family History  Problem Relation Age of Onset  . Cancer Father        prostate  . Cancer Brother        prostate  . Heart disease Other   . Diabetes Other   . Hyperlipidemia Other   . Hypertension Other   . Cancer Other        lung,prostate  . Stroke Other   . Colon cancer Neg Hx   . Esophageal cancer Neg Hx   . Rectal cancer Neg Hx   . Stomach cancer Neg Hx    Social History:  reports that he has never smoked. He quit smokeless tobacco use about 13 months ago.  His smokeless tobacco use included chew. He reports current alcohol use of about 28.0 standard drinks of alcohol per week. He reports that he does not use drugs.  Allergies:  Allergies  Allergen Reactions  . Contrast Media [Iodinated Diagnostic Agents] Other (See Comments) and Cough    Uncontrollable gagging/coughing  . Lisinopril Cough  . Penicillins Other (See Comments)    Did it involve swelling of the face/tongue/throat, SOB, or low BP?  Unknown Did it involve sudden or severe rash/hives, skin peeling, or any reaction on the inside of your mouth or nose? Unknown Did you need to seek medical attention at a hospital or doctor's office? Yes When did it last happen?Infantile Reaction If all above answers are "NO", may proceed with cephalosporin use. Childhood reaction    Facility-Administered Medications Prior to Admission  Medication Dose Route Frequency Provider Last Rate Last Dose  . 0.9 %  sodium chloride infusion  500 mL Intravenous Continuous Irene Shipper, MD       Medications Prior to Admission  Medication Sig Dispense Refill  . albuterol (VENTOLIN HFA) 108 (90 Base) MCG/ACT inhaler Inhale 2 puffs into the lungs every 4 (four) hours as needed for wheezing or shortness of breath. 24 g 3  . Ascorbic Acid (VITAMIN C PO) Take 1,000 mg by mouth daily.     . cholecalciferol (VITAMIN D) 25 MCG (1000 UT) tablet Take 1,000 Units by mouth daily.    . cyclobenzaprine (FLEXERIL) 10 MG tablet Take 1 tablet (10 mg total) by mouth 3 (three) times daily. 30 tablet 0  . fluticasone (FLONASE) 50 MCG/ACT nasal spray Place 2 sprays into both nostrils daily. (Patient taking differently: Place 2 sprays into both nostrils as needed for  allergies or rhinitis. ) 48 g 3  . gabapentin (NEURONTIN) 300 MG capsule Take a 300 mg capsule three times a day for two weeks Then a 300 mg capsule twice a day for two weeks Then a 300 mg capsule once a day for two weeks 84 capsule 0  . HYDROmorphone (DILAUDID) 4 MG tablet Take 1 tablet (4 mg total) by mouth every 4 (four) hours as needed for severe pain. 42 tablet 0  . Krill Oil 350 MG CAPS Take 350 mg by mouth daily.     Marland Kitchen losartan-hydrochlorothiazide (HYZAAR) 100-25 MG tablet Take 1 tablet by mouth daily. 30 tablet 5  . MAGNESIUM PO Take 400 mg by mouth daily.     . metoprolol succinate (TOPROL-XL) 100 MG 24 hr tablet TAKE 1 TABLET BY MOUTH EVERY DAY WITH OR FOLLOWING A MEAL (Patient taking differently:  Take 100 mg by mouth every evening. ) 30 tablet 2  . rivaroxaban (XARELTO) 10 MG TABS tablet Take 1 tablet (10 mg total) by mouth daily with breakfast. 18 tablet 0  . SUPER B COMPLEX/C PO Take 1 capsule by mouth daily.    Marland Kitchen triamcinolone cream (KENALOG) 0.1 % Apply 1 application topically 2 (two) times daily. (Patient taking differently: Apply 1 application topically as needed (Rash). ) 45 g 3    Results for orders placed or performed during the hospital encounter of 10/07/19 (from the past 48 hour(s))  CBC WITH DIFFERENTIAL     Status: Abnormal   Collection Time: 10/07/19  9:10 AM  Result Value Ref Range   WBC 7.7 4.0 - 10.5 K/uL   RBC 4.70 4.22 - 5.81 MIL/uL   Hemoglobin 12.8 (L) 13.0 - 17.0 g/dL   HCT 40.9 39.0 - 52.0 %   MCV 87.0 80.0 - 100.0 fL   MCH 27.2 26.0 - 34.0 pg   MCHC 31.3 30.0 - 36.0 g/dL   RDW 13.3 11.5 - 15.5 %   Platelets 371 150 - 400 K/uL   nRBC 0.0 0.0 - 0.2 %   Neutrophils Relative % 54 %   Neutro Abs 4.1 1.7 - 7.7 K/uL   Lymphocytes Relative 28 %   Lymphs Abs 2.1 0.7 - 4.0 K/uL   Monocytes Relative 10 %   Monocytes Absolute 0.8 0.1 - 1.0 K/uL   Eosinophils Relative 7 %   Eosinophils Absolute 0.5 0.0 - 0.5 K/uL   Basophils Relative 1 %   Basophils Absolute 0.1 0.0 - 0.1 K/uL   Immature Granulocytes 0 %   Abs Immature Granulocytes 0.02 0.00 - 0.07 K/uL    Comment: Performed at [facility], [place] [address] [place],  [address]  Comprehensive metabolic panel     Status: Abnormal   Collection Time: 10/07/19  9:10 AM  Result Value Ref Range   Sodium 139 135 - 145 mmol/L   Potassium 3.9 3.5 - 5.1 mmol/L   Chloride 105 98 - 111 mmol/L   CO2 22 22 - 32 mmol/L   Glucose, Bld 106 (H) 70 - 99 mg/dL   BUN 16 6 - 20 mg/dL   Creatinine, Ser 0.92 0.61 - 1.24 mg/dL   Calcium 9.2 8.9 - 10.3 mg/dL   Total Protein 7.7 6.5 - 8.1 g/dL   Albumin 4.1 3.5 - 5.0 g/dL   AST 33 15 - 41 U/L   ALT 47 (H) 0 - 44 U/L   Alkaline Phosphatase 74 38 - 126  U/L   Total Bilirubin 0.7 0.3 - 1.2  mg/dL   GFR calc non Af Amer >60 >60 mL/min   GFR calc Af Amer >60 >60 mL/min   Anion gap 12 5 - 15    Comment: Performed at [facility], [place] [address], [place], [place] [address]   No results found.  Review of Systems  Constitutional: Negative.   HENT: Negative.   Eyes: Negative.   Respiratory: Negative.   Cardiovascular: Negative.   Gastrointestinal: Negative.   Genitourinary: Negative.   Musculoskeletal: Positive for joint pain.  Skin: Negative.   Neurological: Negative.   Endo/Heme/Allergies: Negative.   Psychiatric/Behavioral: Negative.     Blood pressure (!) 151/90, pulse 74, temperature (!) 97.5 F (36.4 C), temperature source Oral, resp. rate 16, height 5\' 7"  (1.702 m), weight 110.8 kg, SpO2 98 %. Physical Exam  Constitutional: He appears well-developed.  HENT:  Head: Normocephalic.  Eyes: Pupils are equal, round, and reactive to light.  Neck: Normal range of motion.  Cardiovascular: Normal rate.  Respiratory: Effort normal.  GI: Soft.  Musculoskeletal:     Comments: Contracture of his right total knee  Neurological: He is alert.  Skin: Skin is warm.  Psychiatric: He has a normal mood and affect.     Assessment/Plan Closed Manipulation of Right Total Knee.  [name], MD 10/07/2019, 10:46 AM

## 2019-10-07 NOTE — Discharge Instructions (Addendum)
Walk with your Scheer or cane to prevent falls Weight bearing as tolerated Call if any temperatures greater than 101 or any wound complications: [phone] during the day and ask for Dr. Charlestine Night nurse, Brunilda Payor. Follow up in office in one week.  Use CPM knee machine daily as prescribed. Ice the knee 20-30 minutes every 2-3 hours.  No driving. Continue outpatient PT as ordered.    Post Anesthesia Home Care Instructions  Activity: Get plenty of rest for the remainder of the day. A responsible adult should stay with you for 24 hours following the procedure.  For the next 24 hours, DO NOT: -Drive a car -Paediatric nurse -Drink alcoholic beverages -Take any medication unless instructed by your physician -Make any legal decisions or sign important papers.  Meals: Start with liquid foods such as gelatin or soup. Progress to regular foods as tolerated. Avoid greasy, spicy, heavy foods. If nausea and/or vomiting occur, drink only clear liquids until the nausea and/or vomiting subsides. Call your physician if vomiting continues.  Special Instructions/Symptoms: Your throat may feel dry or sore from the anesthesia or the breathing tube placed in your throat during surgery. If this causes discomfort, gargle with warm salt water. The discomfort should disappear within 24 hours.  If you had a scopolamine patch placed behind your ear for the management of post- operative nausea and/or vomiting:  1. The medication in the patch is effective for 72 hours, after which it should be removed.  Wrap patch in a tissue and discard in the trash. Wash hands thoroughly with soap and water. 2. You may remove the patch earlier than 72 hours if you experience unpleasant side effects which may include dry mouth, dizziness or visual disturbances. 3. Avoid touching the patch. Wash your hands with soap and water after contact with the patch.

## 2019-10-07 NOTE — Brief Op Note (Signed)
10/07/2019  11:20 AM  PATIENT:  Micheal Dominguez  53 y.o. male  PRE-OPERATIVE DIAGNOSIS:  right knee arthrofibrosis withsever ,restricted Motion.  POST-OPERATIVE DIAGNOSISSame as Pre-Op  PROCEDURE:  Procedure(s) with comments: CLOSED MANIPULATION KNEE (Right) - 73min  SURGEON:  Surgeon(s) and Role:    * [name], MD - Primary    ASSISTANTS:Or Nurse   ANESTHESIA:   general  EBL:  0cc   BLOOD ADMINISTERED:none  DRAINS: none   LOCAL MEDICATIONS USED:  NONE  SPECIMEN:  No Specimen  DISPOSITION OF SPECIMEN:  N/A  COUNTS:  YES  TOURNIQUET:  * No tourniquets in log *  DICTATION: .Other Dictation: Dictation Number [id number]  PLAN OF CARE: Discharge to home after PACU  PATIENT DISPOSITION:  PACU - hemodynamically stable.   Delay start of Pharmacological VTE agent (>24hrs) due to surgical blood loss or risk of bleeding: yes

## 2019-10-07 NOTE — Anesthesia Procedure Notes (Signed)
Procedure Name: LMA Insertion Date/Time: 10/07/2019 11:01 AM Performed by: Wanita Chamberlain, CRNA Pre-anesthesia Checklist: Patient identified, Timeout performed, Emergency Drugs available, Suction available and Patient being monitored Patient Re-evaluated:Patient Re-evaluated prior to induction Oxygen Delivery Method: Circle system utilized Preoxygenation: Pre-oxygenation with 100% oxygen Induction Type: IV induction Ventilation: Two handed mask ventilation required LMA: LMA inserted LMA Size: 4.0 Number of attempts: 1 Placement Confirmation: breath sounds checked- equal and bilateral,  CO2 detector and positive ETCO2 Tube secured with: Tape Dental Injury: Teeth and Oropharynx as per pre-operative assessment

## 2019-10-08 ENCOUNTER — Encounter (HOSPITAL_BASED_OUTPATIENT_CLINIC_OR_DEPARTMENT_OTHER): Payer: Self-pay | Admitting: Orthopedic Surgery

## 2019-10-08 DIAGNOSIS — M25661 Stiffness of right knee, not elsewhere classified: Secondary | ICD-10-CM | POA: Diagnosis not present

## 2019-10-08 DIAGNOSIS — M25561 Pain in right knee: Secondary | ICD-10-CM | POA: Diagnosis not present

## 2019-10-08 DIAGNOSIS — R269 Unspecified abnormalities of gait and mobility: Secondary | ICD-10-CM | POA: Diagnosis not present

## 2019-10-08 DIAGNOSIS — Z96651 Presence of right artificial knee joint: Secondary | ICD-10-CM | POA: Diagnosis not present

## 2019-10-08 NOTE — Op Note (Signed)
NAME: Micheal Dominguez, Micheal Dominguez MEDICAL RECORD [id number] ACCOUNT [id number] DATE OF BIRTH:[birth year] FACILITY: [facility] LOCATION: [facility] PHYSICIAN:Dak Szumski Fransico Setters, MD  OPERATIVE REPORT  DATE OF PROCEDURE:  10/07/2019  SURGEON:  [name] MD  ASSISTANT:   Nurse.  PREOPERATIVE DIAGNOSIS:  Severe contracture and extension of the right knee.  POSTOPERATIVE DIAGNOSIS:   Severe contracture and extension of the right knee.  INDICATIONS:  Note, this is a right total knee.  Note, the issue here was postop, he had a significant bleed in the knee and calf muscle from his blood thinner.  As a result, he did not want to move and could not really flex his knee adequately.  Despite  the fact of therapy, he still had a difficult time with motion.    PROCEDURE:  Closed manipulation of a right total knee.  DESCRIPTION OF PROCEDURE:  Under general anesthesia with good relaxation, the first timeout was carried out, also marked the appropriate right leg in the holding area.  I did a gentle closed manipulation with several attempts at the manipulation.  A  photograph was taken.  We had him to about 95-100 degrees of flexion.  During the course of the procedure, I did check his posterior tibial pulse.  It was intact at all times.  Postop, he will start immediate physical therapy and we will start him on a  knee machine.  He will be discharged home.  VN/NUANCE  D:10/07/2019 T:10/07/2019 JOB:[id number]

## 2019-10-09 DIAGNOSIS — Z96651 Presence of right artificial knee joint: Secondary | ICD-10-CM | POA: Diagnosis not present

## 2019-10-09 DIAGNOSIS — R269 Unspecified abnormalities of gait and mobility: Secondary | ICD-10-CM | POA: Diagnosis not present

## 2019-10-09 DIAGNOSIS — M25661 Stiffness of right knee, not elsewhere classified: Secondary | ICD-10-CM | POA: Diagnosis not present

## 2019-10-09 DIAGNOSIS — M25561 Pain in right knee: Secondary | ICD-10-CM | POA: Diagnosis not present

## 2019-10-12 DIAGNOSIS — Z96651 Presence of right artificial knee joint: Secondary | ICD-10-CM | POA: Diagnosis not present

## 2019-10-12 DIAGNOSIS — M25661 Stiffness of right knee, not elsewhere classified: Secondary | ICD-10-CM | POA: Diagnosis not present

## 2019-10-12 DIAGNOSIS — M25562 Pain in left knee: Secondary | ICD-10-CM | POA: Diagnosis not present

## 2019-10-12 DIAGNOSIS — R269 Unspecified abnormalities of gait and mobility: Secondary | ICD-10-CM | POA: Diagnosis not present

## 2019-10-12 DIAGNOSIS — M25561 Pain in right knee: Secondary | ICD-10-CM | POA: Diagnosis not present

## 2019-10-13 DIAGNOSIS — M25561 Pain in right knee: Secondary | ICD-10-CM | POA: Diagnosis not present

## 2019-10-13 DIAGNOSIS — Z96651 Presence of right artificial knee joint: Secondary | ICD-10-CM | POA: Diagnosis not present

## 2019-10-13 DIAGNOSIS — R269 Unspecified abnormalities of gait and mobility: Secondary | ICD-10-CM | POA: Diagnosis not present

## 2019-10-13 DIAGNOSIS — M25661 Stiffness of right knee, not elsewhere classified: Secondary | ICD-10-CM | POA: Diagnosis not present

## 2019-10-14 DIAGNOSIS — M25661 Stiffness of right knee, not elsewhere classified: Secondary | ICD-10-CM | POA: Diagnosis not present

## 2019-10-14 DIAGNOSIS — Z96651 Presence of right artificial knee joint: Secondary | ICD-10-CM | POA: Diagnosis not present

## 2019-10-14 DIAGNOSIS — M25561 Pain in right knee: Secondary | ICD-10-CM | POA: Diagnosis not present

## 2019-10-14 DIAGNOSIS — R269 Unspecified abnormalities of gait and mobility: Secondary | ICD-10-CM | POA: Diagnosis not present

## 2019-10-16 DIAGNOSIS — M25561 Pain in right knee: Secondary | ICD-10-CM | POA: Diagnosis not present

## 2019-10-16 DIAGNOSIS — M25661 Stiffness of right knee, not elsewhere classified: Secondary | ICD-10-CM | POA: Diagnosis not present

## 2019-10-16 DIAGNOSIS — Z96651 Presence of right artificial knee joint: Secondary | ICD-10-CM | POA: Diagnosis not present

## 2019-10-16 DIAGNOSIS — R269 Unspecified abnormalities of gait and mobility: Secondary | ICD-10-CM | POA: Diagnosis not present

## 2019-10-19 DIAGNOSIS — M25561 Pain in right knee: Secondary | ICD-10-CM | POA: Diagnosis not present

## 2019-10-19 DIAGNOSIS — M25661 Stiffness of right knee, not elsewhere classified: Secondary | ICD-10-CM | POA: Diagnosis not present

## 2019-10-19 DIAGNOSIS — R269 Unspecified abnormalities of gait and mobility: Secondary | ICD-10-CM | POA: Diagnosis not present

## 2019-10-19 DIAGNOSIS — Z96651 Presence of right artificial knee joint: Secondary | ICD-10-CM | POA: Diagnosis not present

## 2019-10-21 DIAGNOSIS — M25661 Stiffness of right knee, not elsewhere classified: Secondary | ICD-10-CM | POA: Diagnosis not present

## 2019-10-21 DIAGNOSIS — M25561 Pain in right knee: Secondary | ICD-10-CM | POA: Diagnosis not present

## 2019-10-21 DIAGNOSIS — Z96651 Presence of right artificial knee joint: Secondary | ICD-10-CM | POA: Diagnosis not present

## 2019-10-21 DIAGNOSIS — R269 Unspecified abnormalities of gait and mobility: Secondary | ICD-10-CM | POA: Diagnosis not present

## 2019-10-22 DIAGNOSIS — R269 Unspecified abnormalities of gait and mobility: Secondary | ICD-10-CM | POA: Diagnosis not present

## 2019-10-22 DIAGNOSIS — M25561 Pain in right knee: Secondary | ICD-10-CM | POA: Diagnosis not present

## 2019-10-22 DIAGNOSIS — M25661 Stiffness of right knee, not elsewhere classified: Secondary | ICD-10-CM | POA: Diagnosis not present

## 2019-10-22 DIAGNOSIS — Z96651 Presence of right artificial knee joint: Secondary | ICD-10-CM | POA: Diagnosis not present

## 2019-10-24 ENCOUNTER — Other Ambulatory Visit: Payer: Self-pay | Admitting: Family Medicine

## 2019-10-26 DIAGNOSIS — Z5189 Encounter for other specified aftercare: Secondary | ICD-10-CM | POA: Diagnosis not present

## 2019-10-26 DIAGNOSIS — M1712 Unilateral primary osteoarthritis, left knee: Secondary | ICD-10-CM | POA: Diagnosis not present

## 2019-10-27 ENCOUNTER — Encounter: Payer: Self-pay | Admitting: Family Medicine

## 2019-10-27 ENCOUNTER — Ambulatory Visit (INDEPENDENT_AMBULATORY_CARE_PROVIDER_SITE_OTHER): Payer: BC Managed Care – PPO | Admitting: Family Medicine

## 2019-10-27 ENCOUNTER — Other Ambulatory Visit: Payer: Self-pay

## 2019-10-27 VITALS — BP 164/102 | HR 86 | Temp 98.0°F | Wt 249.6 lb

## 2019-10-27 DIAGNOSIS — I10 Essential (primary) hypertension: Secondary | ICD-10-CM | POA: Diagnosis not present

## 2019-10-27 MED ORDER — AMLODIPINE BESYLATE 5 MG PO TABS: 5.0000 mg | ORAL_TABLET | Freq: Every day | ORAL | 3 refills | Status: DC

## 2019-10-27 MED ORDER — LOSARTAN POTASSIUM-HCTZ 100-25 MG PO TABS: 1.0000 | ORAL_TABLET | Freq: Every day | ORAL | 3 refills | Status: DC

## 2019-10-27 MED ORDER — METOPROLOL SUCCINATE ER 100 MG PO TB24: ORAL_TABLET | ORAL | 3 refills | Status: DC

## 2019-10-27 NOTE — Progress Notes (Signed)
Subjective:    Patient ID: Micheal Dominguez, male    DOB: 1966/08/22, 53 y.o.   MRN: [id number]  HPI Here for elevated BP readings. He feels fine. Over the past few weeks his BP has gotten up to the 160s and 170s systolic.    Review of Systems  Constitutional: Negative.   Respiratory: Negative.   Cardiovascular: Negative.   Neurological: Negative.        Objective:   Physical Exam Constitutional:      Appearance: Normal appearance.  Cardiovascular:     Rate and Rhythm: Normal rate and regular rhythm.     Pulses: Normal pulses.     Heart sounds: Normal heart sounds.  Pulmonary:     Effort: Pulmonary effort is normal.     Breath sounds: Normal breath sounds.  Musculoskeletal:     Right lower leg: No edema.     Left lower leg: No edema.  Neurological:     Mental Status: He is alert.           Assessment & Plan:  HTN. We will add Amlodipine 5 mg daily to his other meds. Recheck in 3-4 weeks.  Gershon Crane, MD

## 2019-10-28 DIAGNOSIS — R269 Unspecified abnormalities of gait and mobility: Secondary | ICD-10-CM | POA: Diagnosis not present

## 2019-10-28 DIAGNOSIS — Z96651 Presence of right artificial knee joint: Secondary | ICD-10-CM | POA: Diagnosis not present

## 2019-10-28 DIAGNOSIS — M25561 Pain in right knee: Secondary | ICD-10-CM | POA: Diagnosis not present

## 2019-10-28 DIAGNOSIS — M25661 Stiffness of right knee, not elsewhere classified: Secondary | ICD-10-CM | POA: Diagnosis not present

## 2019-11-20 ENCOUNTER — Other Ambulatory Visit: Payer: Self-pay

## 2019-11-20 ENCOUNTER — Ambulatory Visit (INDEPENDENT_AMBULATORY_CARE_PROVIDER_SITE_OTHER): Payer: Managed Care, Other (non HMO) | Admitting: Family Medicine

## 2019-11-20 ENCOUNTER — Encounter: Payer: Self-pay | Admitting: Family Medicine

## 2019-11-20 VITALS — BP 130/80 | HR 83 | Temp 97.5°F | Ht 67.0 in | Wt 256.4 lb

## 2019-11-20 DIAGNOSIS — Z0000 Encounter for general adult medical examination without abnormal findings: Secondary | ICD-10-CM | POA: Diagnosis not present

## 2019-11-20 LAB — BASIC METABOLIC PANEL
BUN: 12 mg/dL (ref 6–23)
CO2: 28 mEq/L (ref 19–32)
Calcium: 9.1 mg/dL (ref 8.4–10.5)
Chloride: 102 mEq/L (ref 96–112)
Creatinine, Ser: 0.79 mg/dL (ref 0.40–1.50)
GFR: 101.49 mL/min (ref >60.00)
Glucose, Bld: 108 mg/dL — ABNORMAL HIGH (ref 70–99)
Potassium: 4.3 mEq/L (ref 3.5–5.1)
Sodium: 139 mEq/L (ref 135–145)

## 2019-11-20 LAB — LIPID PANEL
Cholesterol: 192 mg/dL (ref 0–200)
HDL: 46.2 mg/dL (ref >39.00)
LDL Cholesterol: 124 mg/dL — ABNORMAL HIGH (ref 0–99)
NonHDL: 145.71
Total CHOL/HDL Ratio: 4
Triglycerides: 110 mg/dL (ref 0.0–149.0)
VLDL: 22 mg/dL (ref 0.0–40.0)

## 2019-11-20 LAB — CBC WITH DIFFERENTIAL/PLATELET
Basophils Absolute: 0.1 10*3/uL (ref 0.0–0.1)
Basophils Relative: 1.2 % (ref 0.0–3.0)
Eosinophils Absolute: 0.4 10*3/uL (ref 0.0–0.7)
Eosinophils Relative: 6.4 % — ABNORMAL HIGH (ref 0.0–5.0)
HCT: 42.6 % (ref 39.0–52.0)
Hemoglobin: 13.9 g/dL (ref 13.0–17.0)
Lymphocytes Relative: 33.2 % (ref 12.0–46.0)
Lymphs Abs: 2.3 10*3/uL (ref 0.7–4.0)
MCHC: 32.7 g/dL (ref 30.0–36.0)
MCV: 84.2 fl (ref 78.0–100.0)
Monocytes Absolute: 0.6 10*3/uL (ref 0.1–1.0)
Monocytes Relative: 8.6 % (ref 3.0–12.0)
Neutro Abs: 3.5 10*3/uL (ref 1.4–7.7)
Neutrophils Relative %: 50.6 % (ref 43.0–77.0)
Platelets: 369 10*3/uL (ref 150.0–400.0)
RBC: 5.05 Mil/uL (ref 4.22–5.81)
RDW: 14.6 % (ref 11.5–15.5)
WBC: 6.8 10*3/uL (ref 4.0–10.5)

## 2019-11-20 LAB — HEPATIC FUNCTION PANEL
ALT: 22 U/L (ref 0–53)
AST: 18 U/L (ref 0–37)
Albumin: 4.2 g/dL (ref 3.5–5.2)
Alkaline Phosphatase: 79 U/L (ref 39–117)
Bilirubin, Direct: 0.1 mg/dL (ref 0.0–0.3)
Total Bilirubin: 0.4 mg/dL (ref 0.2–1.2)
Total Protein: 6.8 g/dL (ref 6.0–8.3)

## 2019-11-20 LAB — PSA: PSA: 1.85 ng/mL (ref 0.10–4.00)

## 2019-11-20 LAB — TSH: TSH: 3.21 u[IU]/mL (ref 0.35–4.50)

## 2019-11-20 MED ORDER — IBUPROFEN 800 MG PO TABS: 800.0000 mg | ORAL_TABLET | Freq: Four times a day (QID) | ORAL | 5 refills | Status: AC | PRN

## 2019-11-20 MED ORDER — ESOMEPRAZOLE MAGNESIUM 40 MG PO PACK: 40.0000 mg | PACK | Freq: Every day | ORAL | 3 refills | Status: DC

## 2019-11-20 NOTE — Progress Notes (Signed)
Subjective:    Patient ID: Micheal Dominguez, male    DOB: [birth year], 54 y.o.   MRN: [id number]  HPI Here for a well exam. He feels well other than his chronic knee pain. We recently started him on Amlodipine for his BP, and this has worked quite well for him.    Review of Systems  Constitutional: Negative.   HENT: Negative.   Eyes: Negative.   Respiratory: Negative.   Cardiovascular: Negative.   Gastrointestinal: Negative.   Genitourinary: Negative.   Musculoskeletal: Negative.   Skin: Negative.   Neurological: Negative.   Psychiatric/Behavioral: Negative.        Objective:   Physical Exam Constitutional:      General: He is not in acute distress.    Appearance: He is well-developed. He is obese. He is not diaphoretic.  HENT:     Head: Normocephalic and atraumatic.     Right Ear: External ear normal.     Left Ear: External ear normal.     Nose: Nose normal.     Mouth/Throat:     Pharynx: No oropharyngeal exudate.  Eyes:     General: No scleral icterus.       Right eye: No discharge.        Left eye: No discharge.     Conjunctiva/sclera: Conjunctivae normal.     Pupils: Pupils are equal, round, and reactive to light.  Neck:     Thyroid: No thyromegaly.     Vascular: No JVD.     Trachea: No tracheal deviation.  Cardiovascular:     Rate and Rhythm: Normal rate and regular rhythm.     Heart sounds: Normal heart sounds. No murmur. No friction rub. No gallop.   Pulmonary:     Effort: Pulmonary effort is normal. No respiratory distress.     Breath sounds: Normal breath sounds. No wheezing or rales.  Chest:     Chest wall: No tenderness.  Abdominal:     General: Bowel sounds are normal. There is no distension.     Palpations: Abdomen is soft. There is no mass.     Tenderness: There is no abdominal tenderness. There is no guarding or rebound.  Genitourinary:    Penis: Normal. No tenderness.      Testes: Normal.     Prostate: Normal.     Rectum: Normal. Guaiac  result negative.  Musculoskeletal:        General: No tenderness. Normal range of motion.     Cervical back: Neck supple.  Lymphadenopathy:     Cervical: No cervical adenopathy.  Skin:    General: Skin is warm and dry.     Coloration: Skin is not pale.     Findings: No erythema or rash.  Neurological:     Mental Status: He is alert and oriented to person, place, and time.     Cranial Nerves: No cranial nerve deficit.     Motor: No abnormal muscle tone.     Coordination: Coordination normal.     Deep Tendon Reflexes: Reflexes are normal and symmetric. Reflexes normal.  Psychiatric:        Behavior: Behavior normal.        Thought Content: Thought content normal.        Judgment: Judgment normal.           Assessment & Plan:  Well exam. We discussed diet and exercise. Get fasting labs.  Gershon Crane, MD

## 2019-12-16 IMAGING — CT CT NECK W/ CM
2 of 5 series · 4 of 14 positions shown, 5 images · IV contrast (iopamidol)
Comparison: None.

CLINICAL DATA: Oral cancer diagnosed 3 weeks ago.  Staging.

EXAM:
CT NECK WITH CONTRAST
TECHNIQUE: Multidetector CT imaging of the neck was performed using the
standard protocol following the bolus administration of intravenous
contrast.
Patient felt nauseous after contrast injection, but exhibited no
other signs to suggest allergy. Patient responded to Zofran
CONTRAST:  75mL L8T2M5-ZTT IOPAMIDOL (L8T2M5-ZTT) INJECTION 61%

[Series 5: neck · axial · 0.54mm/px · z∈[-221,-135]mm · 2 of 130 slices shown]
[im 44/130  bone]
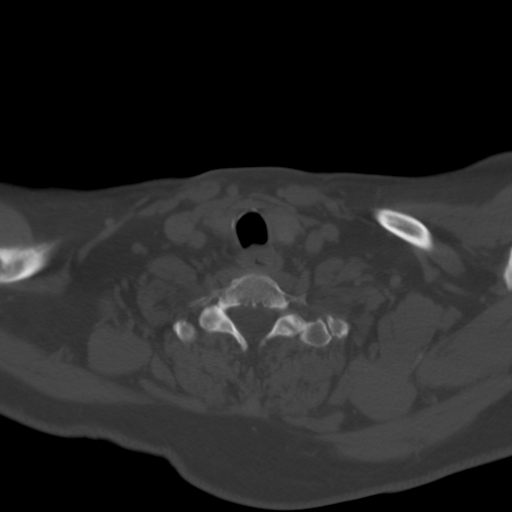
[im 87/130  bone]
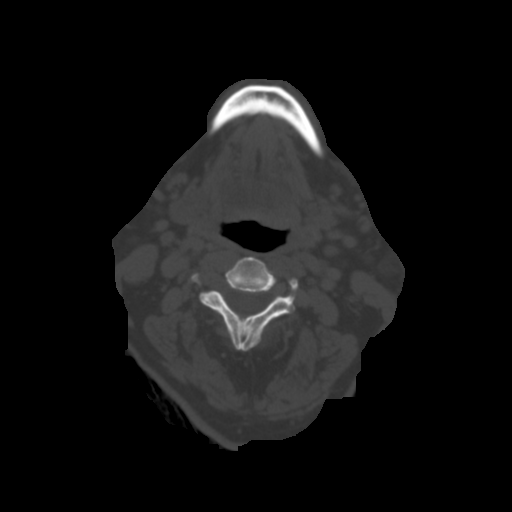

[Series 10: angled axial-oropharynx · axial · 0.39mm/px · z∈[-230,-140]mm · 2 of 135 slices shown, 3 images]
[im 45/135  soft-tissue]
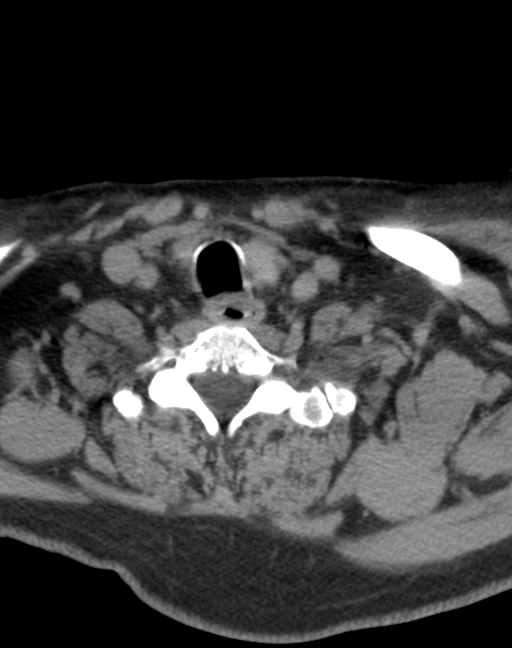
[im 45/135  bone]
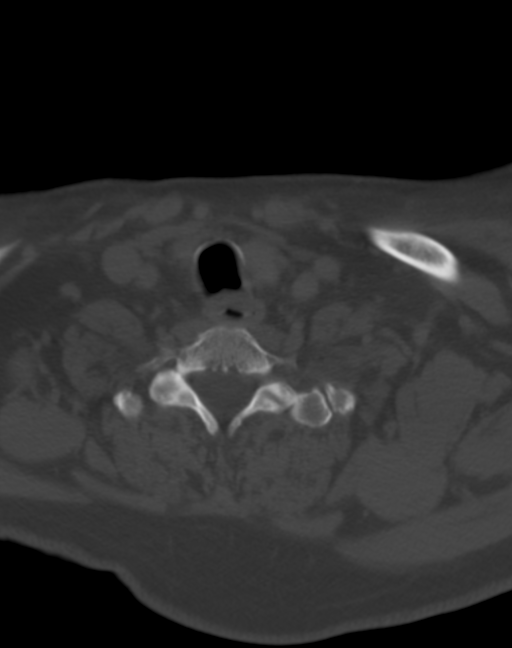
[im 90/135  bone]
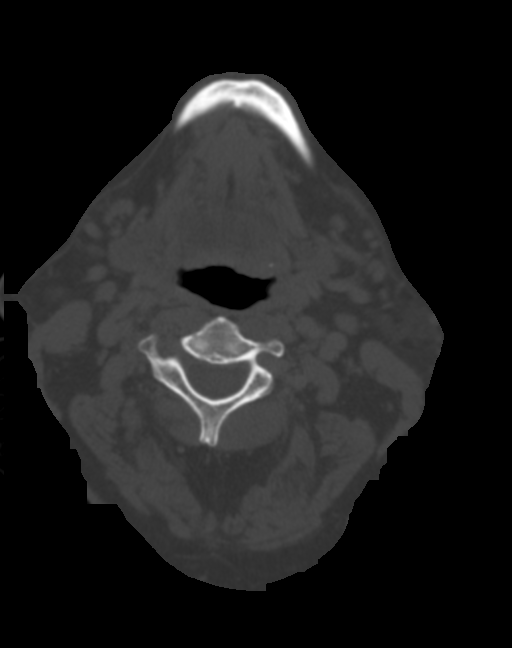

[4 of 14 positions shown; findings below may reference images not displayed]

FINDINGS: Pharynx and larynx: Slight asymmetric fullness base of
tongue/retromolar trigone on the LEFT, without deep space invasion,
see for instance series 5, image 37. Epiglottis and vallecula poorly
visualized due to swallowing artifact. Otherwise unremarkable
pharynx. Normal larynx.

Salivary glands: No inflammation, mass, or stone.

Thyroid: Normal.

Lymph nodes: None enlarged or abnormal density.

Vascular: Atherosclerosis.

Limited intracranial: Negative.

Visualized orbits: Negative.

Mastoids and visualized paranasal sinuses: Clear.

Skeleton: Cervical spondylosis. Multiple extractions of mandibular
teeth.

Upper chest: Motion degraded.  No mass or pneumothorax.

Other: None.
IMPRESSION: Motion degraded exam demonstrating no dominant pharyngeal mass.
Asymmetric fullness base of tongue/retromolar trigone on the LEFT,
without deep space invasion. Correlate with physical exam.

Poorly visualized epiglottis and valleculae due to swallowing. If
concern for tumor in this region, indirect laryngoscopy may be
required.

No pathologic adenopathy.

## 2020-07-30 ENCOUNTER — Other Ambulatory Visit: Payer: Self-pay | Admitting: Family Medicine

## 2020-08-30 ENCOUNTER — Other Ambulatory Visit: Payer: Self-pay | Admitting: Family Medicine

## 2020-09-21 ENCOUNTER — Other Ambulatory Visit: Payer: Self-pay | Admitting: Family Medicine

## 2020-09-26 ENCOUNTER — Other Ambulatory Visit: Payer: Self-pay | Admitting: Family Medicine

## 2020-10-18 ENCOUNTER — Other Ambulatory Visit: Payer: Self-pay | Admitting: Family Medicine

## 2020-10-23 ENCOUNTER — Other Ambulatory Visit: Payer: Self-pay | Admitting: Family Medicine

## 2020-11-17 ENCOUNTER — Other Ambulatory Visit: Payer: Self-pay | Admitting: Family Medicine

## 2020-11-17 NOTE — Telephone Encounter (Signed)
Patient has to be seen for future refills 

## 2020-11-25 ENCOUNTER — Encounter: Payer: Self-pay | Admitting: Family Medicine

## 2020-11-25 ENCOUNTER — Other Ambulatory Visit: Payer: Self-pay | Admitting: Family Medicine

## 2020-11-25 ENCOUNTER — Other Ambulatory Visit: Payer: Self-pay

## 2020-11-25 ENCOUNTER — Ambulatory Visit (INDEPENDENT_AMBULATORY_CARE_PROVIDER_SITE_OTHER): Payer: Managed Care, Other (non HMO) | Admitting: Family Medicine

## 2020-11-25 VITALS — BP 132/84 | HR 65 | Temp 97.8°F | Ht 69.0 in | Wt 255.2 lb

## 2020-11-25 DIAGNOSIS — Z0000 Encounter for general adult medical examination without abnormal findings: Secondary | ICD-10-CM | POA: Diagnosis not present

## 2020-11-25 LAB — BASIC METABOLIC PANEL
BUN: 17 mg/dL (ref 6–23)
CO2: 28 mEq/L (ref 19–32)
Calcium: 9.4 mg/dL (ref 8.4–10.5)
Chloride: 102 mEq/L (ref 96–112)
Creatinine, Ser: 0.96 mg/dL (ref 0.40–1.50)
GFR: 88.1 mL/min (ref >60.00)
Glucose, Bld: 103 mg/dL — ABNORMAL HIGH (ref 70–99)
Potassium: 4.5 mEq/L (ref 3.5–5.1)
Sodium: 137 mEq/L (ref 135–145)

## 2020-11-25 LAB — LIPID PANEL
Cholesterol: 191 mg/dL (ref 0–200)
HDL: 49.1 mg/dL (ref >39.00)
LDL Cholesterol: 110 mg/dL — ABNORMAL HIGH (ref 0–99)
NonHDL: 142.23
Total CHOL/HDL Ratio: 4
Triglycerides: 159 mg/dL — ABNORMAL HIGH (ref 0.0–149.0)
VLDL: 31.8 mg/dL (ref 0.0–40.0)

## 2020-11-25 LAB — HEPATIC FUNCTION PANEL
ALT: 25 U/L (ref 0–53)
AST: 20 U/L (ref 0–37)
Albumin: 4.5 g/dL (ref 3.5–5.2)
Alkaline Phosphatase: 62 U/L (ref 39–117)
Bilirubin, Direct: 0.2 mg/dL (ref 0.0–0.3)
Total Bilirubin: 0.7 mg/dL (ref 0.2–1.2)
Total Protein: 7.4 g/dL (ref 6.0–8.3)

## 2020-11-25 LAB — TESTOSTERONE: Testosterone: 298.64 ng/dL — ABNORMAL LOW (ref 300.00–890.00)

## 2020-11-25 LAB — CBC WITH DIFFERENTIAL/PLATELET
Basophils Absolute: 0.1 10*3/uL (ref 0.0–0.1)
Basophils Relative: 1 % (ref 0.0–3.0)
Eosinophils Absolute: 0.4 10*3/uL (ref 0.0–0.7)
Eosinophils Relative: 5.7 % — ABNORMAL HIGH (ref 0.0–5.0)
HCT: 41.8 % (ref 39.0–52.0)
Hemoglobin: 14.4 g/dL (ref 13.0–17.0)
Lymphocytes Relative: 26.2 % (ref 12.0–46.0)
Lymphs Abs: 2 10*3/uL (ref 0.7–4.0)
MCHC: 34.5 g/dL (ref 30.0–36.0)
MCV: 86.6 fl (ref 78.0–100.0)
Monocytes Absolute: 0.7 10*3/uL (ref 0.1–1.0)
Monocytes Relative: 8.5 % (ref 3.0–12.0)
Neutro Abs: 4.6 10*3/uL (ref 1.4–7.7)
Neutrophils Relative %: 58.6 % (ref 43.0–77.0)
Platelets: 300 10*3/uL (ref 150.0–400.0)
RBC: 4.83 Mil/uL (ref 4.22–5.81)
RDW: 12.5 % (ref 11.5–15.5)
WBC: 7.8 10*3/uL (ref 4.0–10.5)

## 2020-11-25 LAB — HEMOGLOBIN A1C: Hgb A1c MFr Bld: 6.1 % (ref 4.6–6.5)

## 2020-11-25 LAB — TSH: TSH: 2.92 u[IU]/mL (ref 0.35–4.50)

## 2020-11-25 LAB — PSA: PSA: 1.54 ng/mL (ref 0.10–4.00)

## 2020-11-25 MED ORDER — HYDROCHLOROTHIAZIDE 25 MG PO TABS: 25.0000 mg | ORAL_TABLET | Freq: Every day | ORAL | 3 refills | Status: DC

## 2020-11-25 MED ORDER — LOSARTAN POTASSIUM 100 MG PO TABS: 100.0000 mg | ORAL_TABLET | Freq: Every day | ORAL | 3 refills | Status: DC

## 2020-11-25 MED ORDER — METOPROLOL SUCCINATE ER 100 MG PO TB24: ORAL_TABLET | ORAL | 3 refills | Status: DC

## 2020-11-25 MED ORDER — LOSARTAN POTASSIUM-HCTZ 100-25 MG PO TABS: 1.0000 | ORAL_TABLET | Freq: Every day | ORAL | 3 refills | Status: DC

## 2020-11-25 MED ORDER — TRIAMCINOLONE ACETONIDE 0.1 % EX CREA: 1.0000 "application " | TOPICAL_CREAM | Freq: Two times a day (BID) | CUTANEOUS | 5 refills | Status: AC

## 2020-11-25 NOTE — Progress Notes (Signed)
Subjective:    Patient ID: Micheal Dominguez, male    DOB: [birth year], 55 y.o.   MRN: [id number]  HPI Here for a well exam. He feels well but he mentions a lack of sex drive. He has had some borderline low testosterone levels in the past. His BP is stable.    Review of Systems  Constitutional: Negative.   HENT: Negative.   Eyes: Negative.   Respiratory: Negative.   Cardiovascular: Negative.   Gastrointestinal: Negative.   Genitourinary: Negative.   Musculoskeletal: Negative.   Skin: Negative.   Neurological: Negative.   Psychiatric/Behavioral: Negative.        Objective:   Physical Exam Constitutional:      General: He is not in acute distress.    Appearance: He is well-developed and well-nourished. He is obese. He is not diaphoretic.  HENT:     Head: Normocephalic and atraumatic.     Right Ear: External ear normal.     Left Ear: External ear normal.     Nose: Nose normal.     Mouth/Throat:     Mouth: Oropharynx is clear and moist.     Pharynx: No oropharyngeal exudate.  Eyes:     General: No scleral icterus.       Right eye: No discharge.        Left eye: No discharge.     Extraocular Movements: EOM normal.     Conjunctiva/sclera: Conjunctivae normal.     Pupils: Pupils are equal, round, and reactive to light.  Neck:     Thyroid: No thyromegaly.     Vascular: No JVD.     Trachea: No tracheal deviation.  Cardiovascular:     Rate and Rhythm: Normal rate and regular rhythm.     Pulses: Intact distal pulses.     Heart sounds: Normal heart sounds. No murmur heard. No friction rub. No gallop.   Pulmonary:     Effort: Pulmonary effort is normal. No respiratory distress.     Breath sounds: Normal breath sounds. No wheezing or rales.  Chest:     Chest wall: No tenderness.  Abdominal:     General: Bowel sounds are normal. There is no distension.     Palpations: Abdomen is soft. There is no mass.     Tenderness: There is no abdominal tenderness. There is no guarding  or rebound.  Genitourinary:    Penis: Normal. No tenderness.      Testes: Normal.     Prostate: Normal.     Rectum: Normal. Guaiac result negative.  Musculoskeletal:        General: No tenderness or edema. Normal range of motion.     Cervical back: Neck supple.  Lymphadenopathy:     Cervical: No cervical adenopathy.  Skin:    General: Skin is warm and dry.     Coloration: Skin is not pale.     Findings: No erythema or rash.  Neurological:     Mental Status: He is alert and oriented to person, place, and time.     Cranial Nerves: No cranial nerve deficit.     Motor: No abnormal muscle tone.     Coordination: Coordination normal.     Deep Tendon Reflexes: Reflexes are normal and symmetric. Reflexes normal.  Psychiatric:        Mood and Affect: Mood and affect normal.        Behavior: Behavior normal.        Thought Content: Thought content normal.  Judgment: Judgment normal.           Assessment & Plan:  Well exam. We discussed diet and exercise. Get fasting labs including a testosterone level.  Gershon Crane, MD

## 2020-11-25 NOTE — Telephone Encounter (Signed)
This medication is on backorder.   Can a separate prescription be sent to the pharmacy for Losartan 100mg , and for HCTZ for 25mg ?

## 2020-11-25 NOTE — Addendum Note (Signed)
Addended by: Alysia Penna A on: 11/25/2020 12:19 PM   Modules accepted: Orders

## 2020-12-01 ENCOUNTER — Telehealth: Payer: Self-pay | Admitting: Family Medicine

## 2020-12-01 MED ORDER — TESTOSTERONE 50 MG/5GM (1%) TD GEL: 5.0000 g | Freq: Every day | TRANSDERMAL | 1 refills | Status: DC

## 2020-12-01 NOTE — Addendum Note (Signed)
Addended by: Alysia Penna A on: 12/01/2020 12:06 PM   Modules accepted: Orders

## 2020-12-01 NOTE — Telephone Encounter (Signed)
I sent in a rx for testosterone gel to his pharmacy

## 2020-12-08 NOTE — Telephone Encounter (Signed)
(  [employer]) - [id number] Need help? Call us at [phone] Status Sent to [employer] The plan will fax you a determination, typically within 1 to 5 business days.  How do I follow up? Drug Testosterone 50 MG/5GM(1%) gel Form [employer] Testosterone Therapy Form C[employer] Prior Authorization Form for Testosterone Therapy [phone] Original Claim Info 75 MD CALL HELP DESKFOR EMRG OVD, SCC=13 PER RPH DISCRETION

## 2020-12-10 ENCOUNTER — Other Ambulatory Visit: Payer: Self-pay | Admitting: Family Medicine

## 2020-12-14 NOTE — Telephone Encounter (Signed)
Prior authorization submitted for Testosterone Gel 50 mg to [employer]. Determination will be faxed within 1 to 5 days

## 2020-12-16 NOTE — Telephone Encounter (Signed)
The patient called to follow up on the PA

## 2020-12-16 NOTE — Telephone Encounter (Signed)
[employer] spoke with [place].  Prior authorization case is open waiting on Medical Doctor at [facility] to review case .  Will have response by Feb 16th.    [phone]

## 2020-12-16 NOTE — Telephone Encounter (Signed)
LVM informing PA had been complete twice.  Will follow up with insurance

## 2020-12-20 ENCOUNTER — Telehealth: Payer: Self-pay

## 2020-12-20 NOTE — Telephone Encounter (Signed)
PA form filled out/signed/faxed to [facility] @ [phone] for testosterone Gel.  Dm/cma

## 2020-12-21 ENCOUNTER — Telehealth: Payer: Self-pay | Admitting: Family Medicine

## 2020-12-21 NOTE — Telephone Encounter (Signed)
Pt is calling in stating that he needs a PA for Rx testosterone (ANDROGEL) 50 MG pt stated that this is one of the Rx's that was given by his insurance stating that they will cover.  Pt would like to see if a PA can be done so that he is able to get his medication.  Pt would like to have a call back.

## 2020-12-21 NOTE — Telephone Encounter (Signed)
Received a message from [employer] that the PA for Testosterone 50 MG was denied to due blood labs. Attempted to contact Lennette Bihari and discuss PA denial. Left a message to call back.

## 2020-12-27 ENCOUNTER — Other Ambulatory Visit: Payer: Self-pay | Admitting: Family Medicine

## 2021-01-03 ENCOUNTER — Telehealth: Payer: Self-pay | Admitting: Family Medicine

## 2021-01-03 MED ORDER — TESTOSTERONE CYPIONATE 200 MG/ML IM SOLN: 200.0000 mg | INTRAMUSCULAR | 5 refills | Status: DC

## 2021-01-03 MED ORDER — "SYRINGE 20G X 1-1/2"" 3 ML MISC": 1.0000 "application " | 5 refills | Status: DC

## 2021-01-03 NOTE — Telephone Encounter (Signed)
The patients insurance doesn't cover  testosterone (ANDROGEL) 50 MG/5GM (1%) GEL  The insurance will cover Cypionate Injection    [id number] - [place], [place] [address] [place] Phone:  [phone]  Fax:  [phone]

## 2021-01-03 NOTE — Addendum Note (Signed)
Addended by: Alysia Penna A on: 01/03/2021 12:24 PM   Modules accepted: Orders

## 2021-01-03 NOTE — Telephone Encounter (Signed)
I sent in the testosterone and syringes. Please call to cancel the Androgel

## 2021-01-04 NOTE — Telephone Encounter (Signed)
Spoke with pt pharmacy advised to cancel Rx for Androgel due to pt insurance coverage

## 2021-01-14 ENCOUNTER — Other Ambulatory Visit: Payer: Self-pay | Admitting: Family Medicine

## 2021-02-09 ENCOUNTER — Other Ambulatory Visit: Payer: Self-pay | Admitting: Family Medicine

## 2021-03-11 ENCOUNTER — Other Ambulatory Visit: Payer: Self-pay | Admitting: Family Medicine

## 2021-04-28 ENCOUNTER — Ambulatory Visit: Payer: Managed Care, Other (non HMO) | Admitting: Family Medicine

## 2021-04-28 ENCOUNTER — Other Ambulatory Visit: Payer: Self-pay

## 2021-04-28 ENCOUNTER — Encounter: Payer: Self-pay | Admitting: Family Medicine

## 2021-04-28 VITALS — BP 130/88 | HR 64 | Temp 98.5°F | Wt 249.0 lb

## 2021-04-28 DIAGNOSIS — E291 Testicular hypofunction: Secondary | ICD-10-CM | POA: Diagnosis not present

## 2021-04-28 LAB — TESTOSTERONE: Testosterone: 669.99 ng/dL (ref 300.00–890.00)

## 2021-04-28 MED ORDER — SILDENAFIL CITRATE 100 MG PO TABS: 100.0000 mg | ORAL_TABLET | Freq: Every day | ORAL | 11 refills | Status: DC | PRN

## 2021-04-28 NOTE — Progress Notes (Signed)
Subjective:    Patient ID: Micheal Dominguez, male    DOB: 06-13-1966, 55 y.o.   MRN: [id number]  HPI Here to follow up on hypogonadism. He started on testosterone injections in January, and he feels better. His libido has inmproved and he has more energy. However he still has some erection problems.    Review of Systems  Constitutional: Negative.   Respiratory: Negative.    Cardiovascular: Negative.       Objective:   Physical Exam Constitutional:      Appearance: Normal appearance.  Cardiovascular:     Rate and Rhythm: Normal rate and regular rhythm.     Pulses: Normal pulses.     Heart sounds: Normal heart sounds.  Pulmonary:     Effort: Pulmonary effort is normal.     Breath sounds: Normal breath sounds.  Neurological:     Mental Status: He is alert.          Assessment & Plan:  Hypogonadism. We will check another testosterone level today. He will also try Viagra as needed for the ED. Gershon Crane, MD

## 2021-05-01 ENCOUNTER — Other Ambulatory Visit: Payer: Self-pay | Admitting: Family Medicine

## 2021-05-31 ENCOUNTER — Other Ambulatory Visit: Payer: Self-pay | Admitting: Family Medicine

## 2021-06-18 ENCOUNTER — Other Ambulatory Visit: Payer: Self-pay | Admitting: Family Medicine

## 2021-06-21 ENCOUNTER — Other Ambulatory Visit: Payer: Self-pay | Admitting: Family Medicine

## 2021-07-13 ENCOUNTER — Telehealth: Payer: Self-pay

## 2021-07-13 ENCOUNTER — Ambulatory Visit: Payer: Managed Care, Other (non HMO) | Admitting: Internal Medicine

## 2021-07-13 NOTE — Telephone Encounter (Signed)
Patient called to schedule virtual appt and was put in the wrong slot I called patient back to inform and to schedule a different time time.

## 2021-07-14 ENCOUNTER — Telehealth: Payer: Self-pay

## 2021-07-14 NOTE — Telephone Encounter (Signed)
Please advise 

## 2021-07-14 NOTE — Telephone Encounter (Signed)
Patient call requesting Rx for a hacking cough pt had Covid 3 weeks ago pt stated he is unavailable between 1-3 but will listen out for call.

## 2021-07-17 NOTE — Telephone Encounter (Signed)
He will need an OV for this

## 2021-07-17 NOTE — Telephone Encounter (Signed)
Lvm for patient to call back to schedule office visit.

## 2021-07-20 ENCOUNTER — Other Ambulatory Visit: Payer: Self-pay | Admitting: Family Medicine

## 2021-08-07 ENCOUNTER — Other Ambulatory Visit: Payer: Self-pay | Admitting: Family Medicine

## 2021-08-26 ENCOUNTER — Other Ambulatory Visit: Payer: Self-pay | Admitting: Family Medicine

## 2021-09-12 ENCOUNTER — Other Ambulatory Visit: Payer: Self-pay | Admitting: Family Medicine

## 2021-09-25 ENCOUNTER — Other Ambulatory Visit: Payer: Self-pay | Admitting: Family Medicine

## 2021-09-26 ENCOUNTER — Other Ambulatory Visit: Payer: Self-pay | Admitting: Family Medicine

## 2021-10-02 ENCOUNTER — Other Ambulatory Visit: Payer: Self-pay | Admitting: Family Medicine

## 2021-10-26 ENCOUNTER — Other Ambulatory Visit: Payer: Self-pay | Admitting: Family Medicine

## 2021-11-08 DIAGNOSIS — M25522 Pain in left elbow: Secondary | ICD-10-CM | POA: Diagnosis not present

## 2021-11-13 DIAGNOSIS — M1712 Unilateral primary osteoarthritis, left knee: Secondary | ICD-10-CM | POA: Diagnosis not present

## 2021-11-20 ENCOUNTER — Other Ambulatory Visit: Payer: Self-pay

## 2021-11-20 ENCOUNTER — Telehealth: Payer: Self-pay

## 2021-11-20 ENCOUNTER — Other Ambulatory Visit: Payer: Self-pay | Admitting: Family Medicine

## 2021-11-20 NOTE — Telephone Encounter (Signed)
Insurance no longer pays for Albuterol (Ventolin HFA) 108/MCG.  Alternatives:   Proair Respiclic 028 mcg/act Serevent Diskus 62mcg/Act  Please advise of alternative

## 2021-11-21 ENCOUNTER — Other Ambulatory Visit: Payer: Self-pay | Admitting: Family Medicine

## 2021-11-21 MED ORDER — PROAIR RESPICLICK 108 (90 BASE) MCG/ACT IN AEPB: 1.0000 | INHALATION_SPRAY | RESPIRATORY_TRACT | 11 refills | Status: DC | PRN

## 2021-11-21 NOTE — Telephone Encounter (Signed)
Message complete

## 2021-11-21 NOTE — Telephone Encounter (Signed)
I switched to the new Proair inhaler and sent in the RX

## 2021-11-21 NOTE — Telephone Encounter (Signed)
Pharmacy requesting alternative, please advise which on to send

## 2021-11-22 ENCOUNTER — Other Ambulatory Visit: Payer: Self-pay | Admitting: Family Medicine

## 2021-11-22 DIAGNOSIS — I10 Essential (primary) hypertension: Secondary | ICD-10-CM

## 2021-11-23 ENCOUNTER — Other Ambulatory Visit: Payer: Self-pay | Admitting: Family Medicine

## 2021-11-23 DIAGNOSIS — J309 Allergic rhinitis, unspecified: Secondary | ICD-10-CM

## 2021-11-23 NOTE — Telephone Encounter (Signed)
Insurance no longer will pay for Losartan 100mg .  Willing to pay for Losartan 50mg  tabs. Lorsartan 50mg  tabs sent to pharmacy

## 2021-12-18 ENCOUNTER — Telehealth: Payer: Self-pay | Admitting: Family Medicine

## 2021-12-18 DIAGNOSIS — M1712 Unilateral primary osteoarthritis, left knee: Secondary | ICD-10-CM | POA: Diagnosis not present

## 2021-12-18 NOTE — Telephone Encounter (Signed)
Patient called in stating that his insurance company advised him to send in a preauthorization form for patient to get testosterone cypionate (DEPOTESTOSTERONE CYPIONATE) 200 MG/ML injection [[id number]] .   Patient could be contacted at [phone].  Please advise.

## 2021-12-21 NOTE — Telephone Encounter (Signed)
Patient called in stating that Micheal Dominguez advised him to call Dr.Fry to see if he could turn in another PA form with all of the details on it. Patient stated that [employer] informed him that last PA was denied due to missing details.  Please advise.

## 2021-12-21 NOTE — Telephone Encounter (Signed)
I initiated a PA for the pt's Testosterone 200 mg and the plan has been sent to the pt's insurance for approval or denial. As of 02/14 the plan has been denied by the pt's insurance and an appeal can be made. Micheal Dominguez

## 2021-12-23 ENCOUNTER — Other Ambulatory Visit: Payer: Self-pay | Admitting: Family Medicine

## 2021-12-23 DIAGNOSIS — J309 Allergic rhinitis, unspecified: Secondary | ICD-10-CM

## 2021-12-25 DIAGNOSIS — M1712 Unilateral primary osteoarthritis, left knee: Secondary | ICD-10-CM | POA: Diagnosis not present

## 2021-12-28 NOTE — Telephone Encounter (Signed)
Left detailed message for pt regarding Dr Sarajane Jews advise to wait for 90 days for another repeat of Testosterone level due to pt insurance denial of coverage.

## 2022-01-03 DIAGNOSIS — M1712 Unilateral primary osteoarthritis, left knee: Secondary | ICD-10-CM | POA: Diagnosis not present

## 2022-01-05 ENCOUNTER — Ambulatory Visit (INDEPENDENT_AMBULATORY_CARE_PROVIDER_SITE_OTHER): Payer: BC Managed Care – PPO | Admitting: Family Medicine

## 2022-01-05 ENCOUNTER — Encounter: Payer: Self-pay | Admitting: Family Medicine

## 2022-01-05 VITALS — BP 138/86 | HR 68 | Temp 98.8°F | Ht 67.0 in | Wt 254.0 lb

## 2022-01-05 DIAGNOSIS — E291 Testicular hypofunction: Secondary | ICD-10-CM | POA: Diagnosis not present

## 2022-01-05 DIAGNOSIS — Z0000 Encounter for general adult medical examination without abnormal findings: Secondary | ICD-10-CM

## 2022-01-05 LAB — HEPATIC FUNCTION PANEL
ALT: 38 U/L (ref 0–53)
AST: 26 U/L (ref 0–37)
Albumin: 4.5 g/dL (ref 3.5–5.2)
Alkaline Phosphatase: 56 U/L (ref 39–117)
Bilirubin, Direct: 0.2 mg/dL (ref 0.0–0.3)
Total Bilirubin: 0.9 mg/dL (ref 0.2–1.2)
Total Protein: 7.5 g/dL (ref 6.0–8.3)

## 2022-01-05 LAB — CBC WITH DIFFERENTIAL/PLATELET
Basophils Absolute: 0.1 10*3/uL (ref 0.0–0.1)
Basophils Relative: 1.1 % (ref 0.0–3.0)
Eosinophils Absolute: 0.7 10*3/uL (ref 0.0–0.7)
Eosinophils Relative: 7.6 % — ABNORMAL HIGH (ref 0.0–5.0)
HCT: 48.1 % (ref 39.0–52.0)
Hemoglobin: 16.5 g/dL (ref 13.0–17.0)
Lymphocytes Relative: 25.9 % (ref 12.0–46.0)
Lymphs Abs: 2.3 10*3/uL (ref 0.7–4.0)
MCHC: 34.2 g/dL (ref 30.0–36.0)
MCV: 87.9 fl (ref 78.0–100.0)
Monocytes Absolute: 1 10*3/uL (ref 0.1–1.0)
Monocytes Relative: 10.9 % (ref 3.0–12.0)
Neutro Abs: 4.8 10*3/uL (ref 1.4–7.7)
Neutrophils Relative %: 54.5 % (ref 43.0–77.0)
Platelets: 293 10*3/uL (ref 150.0–400.0)
RBC: 5.47 Mil/uL (ref 4.22–5.81)
RDW: 13.2 % (ref 11.5–15.5)
WBC: 8.9 10*3/uL (ref 4.0–10.5)

## 2022-01-05 LAB — BASIC METABOLIC PANEL
BUN: 15 mg/dL (ref 6–23)
CO2: 28 mEq/L (ref 19–32)
Calcium: 9.6 mg/dL (ref 8.4–10.5)
Chloride: 102 mEq/L (ref 96–112)
Creatinine, Ser: 0.97 mg/dL (ref 0.40–1.50)
GFR: 86.33 mL/min (ref >60.00)
Glucose, Bld: 112 mg/dL — ABNORMAL HIGH (ref 70–99)
Potassium: 5 mEq/L (ref 3.5–5.1)
Sodium: 138 mEq/L (ref 135–145)

## 2022-01-05 LAB — LIPID PANEL
Cholesterol: 190 mg/dL (ref 0–200)
HDL: 46.4 mg/dL (ref >39.00)
LDL Cholesterol: 128 mg/dL — ABNORMAL HIGH (ref 0–99)
NonHDL: 143.65
Total CHOL/HDL Ratio: 4
Triglycerides: 79 mg/dL (ref 0.0–149.0)
VLDL: 15.8 mg/dL (ref 0.0–40.0)

## 2022-01-05 LAB — TSH: TSH: 3.34 u[IU]/mL (ref 0.35–5.50)

## 2022-01-05 LAB — HEMOGLOBIN A1C: Hgb A1c MFr Bld: 6.5 % (ref 4.6–6.5)

## 2022-01-05 LAB — TESTOSTERONE: Testosterone: 363.92 ng/dL (ref 300.00–890.00)

## 2022-01-05 LAB — PSA: PSA: 2.87 ng/mL (ref 0.10–4.00)

## 2022-01-05 MED ORDER — PHENTERMINE HCL 37.5 MG PO CAPS: 37.5000 mg | ORAL_CAPSULE | ORAL | 0 refills | Status: DC

## 2022-01-05 NOTE — Progress Notes (Signed)
? ?Subjective:  ? ? Patient ID: Micheal Dominguez, male    DOB: November 20, 1965, 56 y.o.   MRN: [id number] ? ?HPI ?Here for a well exam. He feels well in general , but he says he can really tell a difference when he is not taking testosterone. He had been giving himself shots and he felt great with lots of energy. However since his last level was normal, his insurance company stopped covering it. Now he feels tired and has low lidbido again. He also asks for help with losing weight. He tries to eta a healthy diet and to exercise, but he has had little success.  ? ? ?Review of Systems  ?Constitutional: Negative.   ?HENT: Negative.    ?Eyes: Negative.   ?Respiratory: Negative.    ?Cardiovascular: Negative.   ?Gastrointestinal: Negative.   ?Genitourinary: Negative.   ?Musculoskeletal: Negative.   ?Skin: Negative.   ?Neurological: Negative.   ?Psychiatric/Behavioral: Negative.    ? ?   ?Objective:  ? Physical Exam ?Constitutional:   ?   General: He is not in acute distress. ?   Appearance: He is well-developed. He is obese. He is not diaphoretic.  ?HENT:  ?   Head: Normocephalic and atraumatic.  ?   Right Ear: External ear normal.  ?   Left Ear: External ear normal.  ?   Nose: Nose normal.  ?   Mouth/Throat:  ?   Pharynx: No oropharyngeal exudate.  ?Eyes:  ?   General: No scleral icterus.    ?   Right eye: No discharge.     ?   Left eye: No discharge.  ?   Conjunctiva/sclera: Conjunctivae normal.  ?   Pupils: Pupils are equal, round, and reactive to light.  ?Neck:  ?   Thyroid: No thyromegaly.  ?   Vascular: No JVD.  ?   Trachea: No tracheal deviation.  ?Cardiovascular:  ?   Rate and Rhythm: Normal rate and regular rhythm.  ?   Heart sounds: Normal heart sounds. No murmur heard. ?  No friction rub. No gallop.  ?Pulmonary:  ?   Effort: Pulmonary effort is normal. No respiratory distress.  ?   Breath sounds: Normal breath sounds. No wheezing or rales.  ?Chest:  ?   Chest wall: No tenderness.  ?Abdominal:  ?   General: Bowel sounds  are normal. There is no distension.  ?   Palpations: Abdomen is soft. There is no mass.  ?   Tenderness: There is no abdominal tenderness. There is no guarding or rebound.  ?Genitourinary: ?   Penis: Normal. No tenderness.   ?   Testes: Normal.  ?   Prostate: Normal.  ?   Rectum: Normal. Guaiac result negative.  ?Musculoskeletal:     ?   General: No tenderness. Normal range of motion.  ?   Cervical back: Neck supple.  ?Lymphadenopathy:  ?   Cervical: No cervical adenopathy.  ?Skin: ?   General: Skin is warm and dry.  ?   Coloration: Skin is not pale.  ?   Findings: No erythema or rash.  ?Neurological:  ?   Mental Status: He is alert and oriented to person, place, and time.  ?   Cranial Nerves: No cranial nerve deficit.  ?   Motor: No abnormal muscle tone.  ?   Coordination: Coordination normal.  ?   Deep Tendon Reflexes: Reflexes are normal and symmetric. Reflexes normal.  ?Psychiatric:     ?   Behavior: Behavior  normal.     ?   Thought Content: Thought content normal.     ?   Judgment: Judgment normal.  ? ? ? ? ? ?   ?Assessment & Plan:  ?Well exam. We discussed diet and exercise. He will try Phentermine daily for 90 days. He will watch his BP at home. Get fasting labs. We will check another testosterone level. As soon as it drops below normal, we will resubmit our request to get his testosterone shots covered again.  ?Gershon Crane, MD ? ? ?

## 2022-01-07 ENCOUNTER — Other Ambulatory Visit: Payer: Self-pay | Admitting: Family Medicine

## 2022-01-07 DIAGNOSIS — J309 Allergic rhinitis, unspecified: Secondary | ICD-10-CM

## 2022-01-10 NOTE — Addendum Note (Signed)
Addended by: Alysia Penna A on: 01/10/2022 08:16 AM ? ? Modules accepted: Orders ? ?

## 2022-01-26 ENCOUNTER — Other Ambulatory Visit (INDEPENDENT_AMBULATORY_CARE_PROVIDER_SITE_OTHER): Payer: BC Managed Care – PPO

## 2022-01-26 DIAGNOSIS — E291 Testicular hypofunction: Secondary | ICD-10-CM | POA: Diagnosis not present

## 2022-01-30 LAB — TESTOSTERONE: Testosterone: 215.7 ng/dL — ABNORMAL LOW (ref 300.00–890.00)

## 2022-02-14 ENCOUNTER — Other Ambulatory Visit: Payer: Self-pay | Admitting: Family Medicine

## 2022-02-14 DIAGNOSIS — M1712 Unilateral primary osteoarthritis, left knee: Secondary | ICD-10-CM | POA: Diagnosis not present

## 2022-02-14 NOTE — Telephone Encounter (Signed)
Pt called wanting to see if someone can give him a call back concerning his Testosterone blood work results.  ? ?Please advise. ?

## 2022-02-15 NOTE — Telephone Encounter (Signed)
Spoke with patient and reviewed lab result. ?

## 2022-02-21 ENCOUNTER — Other Ambulatory Visit: Payer: Self-pay | Admitting: Family Medicine

## 2022-02-21 DIAGNOSIS — I10 Essential (primary) hypertension: Secondary | ICD-10-CM

## 2022-02-21 MED ORDER — TESTOSTERONE CYPIONATE 200 MG/ML IM SOLN: 200.0000 mg | INTRAMUSCULAR | 0 refills | Status: DC

## 2022-02-21 NOTE — Addendum Note (Signed)
Addended by: Apolinar Junes on: 02/21/2022 05:01 PM ? ? Modules accepted: Orders ? ?

## 2022-02-21 NOTE — Telephone Encounter (Signed)
Pt is calling to see if testosterone cypionate (DEPOTESTOSTERONE CYPIONATE) 200 MG/ML injection can [id number] [place]  [phone] ?Fax:  [phone] ?  ? ? ?

## 2022-02-21 NOTE — Addendum Note (Signed)
Addended by: Nathanial Millman E on: 02/21/2022 12:09 PM ? ? Modules accepted: Orders ? ?

## 2022-02-22 ENCOUNTER — Other Ambulatory Visit: Payer: Self-pay

## 2022-02-22 DIAGNOSIS — I10 Essential (primary) hypertension: Secondary | ICD-10-CM

## 2022-02-22 MED ORDER — LOSARTAN POTASSIUM 50 MG PO TABS: 100.0000 mg | ORAL_TABLET | Freq: Every day | ORAL | 1 refills | Status: DC

## 2022-02-22 MED ORDER — HYDROCHLOROTHIAZIDE 25 MG PO TABS: 25.0000 mg | ORAL_TABLET | Freq: Every day | ORAL | 1 refills | Status: DC

## 2022-03-08 ENCOUNTER — Telehealth: Payer: Self-pay | Admitting: Family Medicine

## 2022-03-08 NOTE — Telephone Encounter (Signed)
Pt requesting a refill of phentermine 37.5 MG capsule to be sent to  [id number] [place]  [phone] ?Fax:  [phone] ?  ? ? ? ?

## 2022-03-08 NOTE — Telephone Encounter (Signed)
Last refill- 01/05/22-90 tabs, 0 refils ?Last OV physical - 01/05/22 ? ?No future OV scheduled. ?

## 2022-03-09 MED ORDER — PHENTERMINE HCL 37.5 MG PO CAPS: 37.5000 mg | ORAL_CAPSULE | ORAL | 1 refills | Status: DC

## 2022-03-09 NOTE — Telephone Encounter (Signed)
Spoke with pharmacy on 03/08/22.   Pharmacy will contact patient when prescription is ready for pick up.  ?

## 2022-03-09 NOTE — Telephone Encounter (Signed)
Done

## 2022-03-13 ENCOUNTER — Telehealth: Payer: Self-pay | Admitting: Family Medicine

## 2022-03-13 NOTE — Telephone Encounter (Signed)
Pt requesting provider file an appeal regarding his health insurance ([employer]) denying his testosterone cypionate (DEPOTESTOSTERONE CYPIONATE) 200 MG/ML injection    pt can be reached at [phone]. States they denied it because his testosterone levels were taken in the early morning and it was low. Pt does not understand the denial ?

## 2022-03-29 NOTE — Telephone Encounter (Signed)
Pt PA sent to plan but received this message, Will call plan for more info This request has received a Cancelled outcome.  This may mean either your patient does not have active coverage with this plan, this authorization was processed as a duplicate request, or an authorization was not needed for this medication.

## 2022-04-10 ENCOUNTER — Other Ambulatory Visit: Payer: Self-pay | Admitting: Adult Health

## 2022-04-11 NOTE — Telephone Encounter (Signed)
Okay for refill?  

## 2022-04-13 ENCOUNTER — Other Ambulatory Visit: Payer: Self-pay | Admitting: Family Medicine

## 2022-04-18 ENCOUNTER — Telehealth: Payer: Self-pay | Admitting: Family Medicine

## 2022-04-18 NOTE — Telephone Encounter (Signed)
Calling to see if provider would change prescription phentermine 37.5 MG capsule from capsules to tablets as capsules are on backorder

## 2022-04-19 MED ORDER — PHENTERMINE HCL 37.5 MG PO TABS
37.5000 mg | ORAL_TABLET | Freq: Every day | ORAL | 1 refills | Status: DC
Start: 2022-04-19 — End: 2023-02-15

## 2022-04-19 NOTE — Telephone Encounter (Signed)
Done

## 2022-04-25 NOTE — Telephone Encounter (Signed)
Pt plan denies approval for Testosterone, spoke with an agent of [employer], state that both the initial and appeal for this Rx has been denied advised to try have pt give consent to send another appeal to plan. Agent state that this could take up to months in review process.

## 2022-05-05 ENCOUNTER — Emergency Department (HOSPITAL_COMMUNITY): Payer: BC Managed Care – PPO

## 2022-05-05 ENCOUNTER — Emergency Department (HOSPITAL_COMMUNITY)
Admission: EM | Admit: 2022-05-05 | Discharge: 2022-05-05 | Disposition: A | Discharge status: Home / Self Care | Payer: BC Managed Care – PPO | Attending: Emergency Medicine | Admitting: Emergency Medicine

## 2022-05-05 ENCOUNTER — Encounter (HOSPITAL_COMMUNITY): Payer: Self-pay | Admitting: Emergency Medicine

## 2022-05-05 ENCOUNTER — Other Ambulatory Visit: Payer: Self-pay

## 2022-05-05 DIAGNOSIS — W231XXA Caught, crushed, jammed, or pinched between stationary objects, initial encounter: Secondary | ICD-10-CM | POA: Diagnosis not present

## 2022-05-05 DIAGNOSIS — S61312A Laceration without foreign body of right middle finger with damage to nail, initial encounter: Secondary | ICD-10-CM | POA: Insufficient documentation

## 2022-05-05 DIAGNOSIS — Z79899 Other long term (current) drug therapy: Secondary | ICD-10-CM | POA: Diagnosis not present

## 2022-05-05 DIAGNOSIS — S6991XA Unspecified injury of right wrist, hand and finger(s), initial encounter: Secondary | ICD-10-CM | POA: Diagnosis not present

## 2022-05-05 DIAGNOSIS — S67192A Crushing injury of right middle finger, initial encounter: Secondary | ICD-10-CM | POA: Diagnosis not present

## 2022-05-05 DIAGNOSIS — S61212A Laceration without foreign body of right middle finger without damage to nail, initial encounter: Secondary | ICD-10-CM | POA: Diagnosis not present

## 2022-05-05 MED ORDER — LIDOCAINE HCL (PF) 2 % IJ SOLN
10.0000 mL | Freq: Once | INTRAMUSCULAR | Status: DC
Start: 2022-05-05 — End: 2022-05-05

## 2022-05-05 MED ORDER — LIDOCAINE HCL (PF) 1 % IJ SOLN
5.0000 mL | Freq: Once | INTRAMUSCULAR | Status: AC
Start: 2022-05-05 — End: 2022-05-05
  Administered 2022-05-05: 5 mL
  Filled 2022-05-05: qty 5

## 2022-05-05 MED ORDER — LIDOCAINE HCL (PF) 1 % IJ SOLN
5.0000 mL | Freq: Once | INTRAMUSCULAR | Status: AC
Start: 2022-05-05 — End: 2022-05-05
  Administered 2022-05-05: 5 mL

## 2022-05-05 NOTE — ED Triage Notes (Signed)
Pt states he smashed right middle finger between 2 rocks last night about 2100.

## 2022-05-05 NOTE — ED Provider Notes (Signed)
[facility] EMERGENCY DEPARTMENT Provider Note   CSN: [id number] Arrival date & time: 05/05/22  6546     History {Add pertinent medical, surgical, social history, OB history to HPI:1} Chief Complaint  Patient presents with   Hand Injury    Micheal Dominguez is a 56 y.o. male.  Patient presents to the emergency department for evaluation of right middle finger injury.  Patient reports that he smashed his finger between 2 rocks earlier tonight.  Patient reports laceration and pain.       Home Medications Prior to Admission medications   Medication Sig Start Date End Date Taking? Authorizing Provider  Albuterol Sulfate (PROAIR RESPICLICK) 503 (90 Base) MCG/ACT AEPB Inhale 1 puff into the lungs every 4 (four) hours as needed (wheezing or shortness of breath). 11/21/21   Laurey Morale, MD  amLODipine (NORVASC) 5 MG tablet TAKE 1 TABLET BY MOUTH EVERY DAY 10/02/21   Laurey Morale, MD  Ascorbic Acid (VITAMIN C PO) Take 1,000 mg by mouth daily.     [provider]  B-D 3CC LUER-LOK SYR 20GX1" 20G X 1" 3 ML MISC FOR USE EVERY 14 DAYS WITH INJECTION 04/13/22   Laurey Morale, MD  cholecalciferol (VITAMIN D) 25 MCG (1000 UT) tablet Take 1,000 Units by mouth daily.    [provider]  esomeprazole (NEXIUM) 40 MG capsule Take 40 mg by mouth daily.    [provider]  fluticasone (FLONASE) 50 MCG/ACT nasal spray PLACE 2 SPRAYS INTO BOTH NOSTRILS AS NEEDED FOR ALLERGIES OR RHINITIS. 01/08/22   Laurey Morale, MD  hydrochlorothiazide (HYDRODIURIL) 25 MG tablet Take 1 tablet (25 mg total) by mouth daily. 02/22/22   Laurey Morale, MD  ibuprofen (ADVIL) 800 MG tablet Take 1 tablet (800 mg total) by mouth every 6 (six) hours as needed for moderate pain. 11/20/19   Laurey Morale, MD  Krill Oil 350 MG CAPS Take 350 mg by mouth daily.     [provider]  losartan (COZAAR) 50 MG tablet Take 2 tablets (100 mg total) by mouth daily. 02/22/22   Laurey Morale, MD  MAGNESIUM PO  Take 400 mg by mouth daily.     [provider]  metoprolol succinate (TOPROL-XL) 100 MG 24 hr tablet TAKE 1 TABLET DAILY WITH OR IMMEDIATELY FOLLOWING A MEAL. 02/21/22   Laurey Morale, MD  phentermine (ADIPEX-P) 37.5 MG tablet Take 1 tablet (37.5 mg total) by mouth daily before breakfast. 04/19/22   Laurey Morale, MD  sildenafil (VIAGRA) 100 MG tablet Take 1 tablet (100 mg total) by mouth daily as needed for erectile dysfunction. 04/28/21   Laurey Morale, MD  SUPER B COMPLEX/C PO Take 1 capsule by mouth daily.    [provider]  Syringe/Needle, Disp, (SYRINGE 3CC/20GX1-1/2") 20G X 1-1/2" 3 ML MISC 1 application by Does not apply route every 14 (fourteen) days. 01/03/21   Laurey Morale, MD  testosterone cypionate (DEPOTESTOSTERONE CYPIONATE) 200 MG/ML injection INJECT 1 ML (200 MG TOTAL) INTO THE MUSCLE EVERY 14 DAYS 04/12/22   Laurey Morale, MD  triamcinolone (KENALOG) 0.1 % Apply 1 application topically 2 (two) times daily. 11/25/20   Laurey Morale, MD      Allergies    Contrast media [iodinated contrast media], Lisinopril, and Penicillins    Review of Systems   Review of Systems  Physical Exam Updated Vital Signs BP (!) 140/91   Pulse 69   Temp 97.9 F (36.6  C) (Oral)   Resp 18   Ht '5\' 7"'$  (1.702 m)   Wt 115 kg   SpO2 98%   BMI 39.71 kg/m  Physical Exam Musculoskeletal:     Comments: Swelling with laceration distal phalanx. Subungual hematoma present.  Skin:    Findings: Laceration (R middle finger) present.  Neurological:     Sensory: Sensation is intact.     Motor: Motor function is intact.     ED Results / Procedures / Treatments   Labs (all labs ordered are listed, but only abnormal results are displayed) Labs Reviewed - No data to display  EKG None  Radiology DG Finger Middle Right  Result Date: 05/05/2022 CLINICAL DATA:  Crush injury to the third digit, initial encounter EXAM: RIGHT MIDDLE FINGER 2+V COMPARISON:  None Available. FINDINGS: Soft  tissue swelling and subcutaneous air is noted related to the recent injury. No acute fracture or dislocation is noted. Foreign body is seen. IMPRESSION: Soft tissue swelling without acute bony abnormality. Electronically Signed   By: Inez Catalina M.D.   On: 05/05/2022 03:08    Procedures .Marland KitchenLaceration Repair  Date/Time: 05/05/2022 4:22 AM  Performed by: Orpah Greek, MD Authorized by: Orpah Greek, MD   Consent:    Consent obtained:  Verbal   Consent given by:  Patient   Risks, benefits, and alternatives were discussed: yes     Risks discussed:  Infection and pain Universal protocol:    Procedure explained and questions answered to patient or proxy's satisfaction: yes     Relevant documents present and verified: yes     Test results available: yes     Imaging studies available: yes     Required blood products, implants, devices, and special equipment available: yes     Site/side marked: yes     Immediately prior to procedure, a time out was called: yes     Patient identity confirmed:  Verbally with patient Anesthesia:    Anesthesia method:  Nerve block   Block location:  Finger   Block needle gauge:  25 G   Block anesthetic:  Lidocaine 2% w/o epi   Block technique:  Digital   Block injection procedure:  Anatomic landmarks identified, introduced needle, incremental injection, anatomic landmarks palpated and negative aspiration for blood   Block outcome:  Anesthesia achieved Laceration details:    Location:  Finger   Finger location:  R long finger   Length (cm):  1.5 Pre-procedure details:    Preparation:  Patient was prepped and draped in usual sterile fashion and imaging obtained to evaluate for foreign bodies Exploration:    Contaminated: no   Treatment:    Area cleansed with:  Povidone-iodine   Amount of cleaning:  Extensive   Irrigation solution:  Sterile saline   Irrigation method:  Syringe   Debridement:  None Skin repair:    Repair method:   Sutures   Suture size:  4-0   Suture material:  Prolene   Suture technique:  Simple interrupted   Number of sutures:  3 Approximation:    Approximation:  Close Repair type:    Repair type:  Simple Post-procedure details:    Dressing:  Open (no dressing)   Procedure completion:  Tolerated well, no immediate complications Comments:     Subungual hematoma drained with cautery pen   {Document cardiac monitor, telemetry assessment procedure when appropriate:1}  Medications Ordered in ED Medications  lidocaine (PF) (XYLOCAINE) 1 % injection 5 mL (has no administration in  time range)  lidocaine (PF) (XYLOCAINE) 1 % injection 5 mL (has no administration in time range)    ED Course/ Medical Decision Making/ A&P                           Medical Decision Making Amount and/or Complexity of Data Reviewed Radiology: ordered.  Risk Prescription drug management.   Crush injury to finger. Xray neg. Sutures placed. Subungua hematoma drained.   {Document critical care time when appropriate:1} {Document review of labs and clinical decision tools ie heart score, Chads2Vasc2 etc:1}  {Document your independent review of radiology images, and any outside records:1} {Document your discussion with family members, caretakers, and with consultants:1} {Document social determinants of health affecting pt's care:1} {Document your decision making why or why not admission, treatments were needed:1} Final Clinical Impression(s) / ED Diagnoses Final diagnoses:  Laceration of right middle finger without foreign body with damage to nail, initial encounter    Rx / DC Orders ED Discharge Orders     None

## 2022-05-15 ENCOUNTER — Encounter: Payer: Self-pay | Admitting: Family Medicine

## 2022-05-15 ENCOUNTER — Ambulatory Visit: Payer: BC Managed Care – PPO | Admitting: Family Medicine

## 2022-05-15 VITALS — BP 136/82 | HR 70 | Temp 97.9°F | Wt 241.0 lb

## 2022-05-15 DIAGNOSIS — E291 Testicular hypofunction: Secondary | ICD-10-CM

## 2022-05-15 DIAGNOSIS — S61312D Laceration without foreign body of right middle finger with damage to nail, subsequent encounter: Secondary | ICD-10-CM

## 2022-05-15 MED ORDER — TESTOSTERONE CYPIONATE 200 MG/ML IM SOLN: 200.0000 mg | INTRAMUSCULAR | 1 refills | Status: DC

## 2022-05-15 NOTE — Telephone Encounter (Signed)
Pt was at the office today state that he is paying for Testosterone out of pocket, state that he wanted to discuss with Dr Sarajane Jews about an alternative mediation that is covered by his insurance.

## 2022-05-15 NOTE — Progress Notes (Signed)
Subjective:    Patient ID: Micheal Dominguez, male    DOB: Jul 14, 1966, 56 y.o.   MRN: [id number]  HPI Here for several issues. First he went to urgent care on 05-05-22 for an injury to the right 3rd finger. While building a rock wall ay his home. He pinched the finger between two rocks. At the urgent care he had a laceration that was closed with 3 sutures. An Xray revealed no fractures. Since then the finger has been painful, and he has been dressing it with a bulky dressing to protect it. Also we have been treating him with testosterone shots every 14 days since he was found to have a low level at 215 in January. He has had some improvement in libido and in erections, but he asks if can take more of this medication.    Review of Systems  Constitutional: Negative.   Respiratory: Negative.    Cardiovascular: Negative.   Skin:  Positive for wound.       Objective:   Physical Exam Constitutional:      General: He is not in acute distress.    Appearance: Normal appearance.  Cardiovascular:     Rate and Rhythm: Normal rate and regular rhythm.     Pulses: Normal pulses.     Heart sounds: Normal heart sounds.  Pulmonary:     Effort: Pulmonary effort is normal.     Breath sounds: Normal breath sounds.  Skin:    Comments: The pad of the right third finger has a 1 cm laceration which is closed with 3 sutures. The wound looks clean   Neurological:     Mental Status: He is alert.           Assessment & Plan:  For the laceration, all sutures were removed today and we gave him a finger splint that he can protect the finger with while it is healing. For the hypogonadism, we will increase the testosterone shots to 200 mg every 7 days. Gershon Crane, MD

## 2022-08-09 DIAGNOSIS — C069 Malignant neoplasm of mouth, unspecified: Secondary | ICD-10-CM | POA: Diagnosis not present

## 2022-08-16 ENCOUNTER — Other Ambulatory Visit: Payer: Self-pay | Admitting: Family Medicine

## 2022-08-16 DIAGNOSIS — I10 Essential (primary) hypertension: Secondary | ICD-10-CM

## 2022-08-17 DIAGNOSIS — C069 Malignant neoplasm of mouth, unspecified: Secondary | ICD-10-CM | POA: Diagnosis not present

## 2022-08-17 DIAGNOSIS — R918 Other nonspecific abnormal finding of lung field: Secondary | ICD-10-CM | POA: Diagnosis not present

## 2022-09-05 ENCOUNTER — Other Ambulatory Visit: Payer: Self-pay | Admitting: Family Medicine

## 2022-09-20 DIAGNOSIS — C069 Malignant neoplasm of mouth, unspecified: Secondary | ICD-10-CM | POA: Diagnosis not present

## 2022-10-10 ENCOUNTER — Other Ambulatory Visit: Payer: Self-pay | Admitting: Family Medicine

## 2022-11-15 ENCOUNTER — Other Ambulatory Visit: Payer: Self-pay | Admitting: Family Medicine

## 2022-11-15 DIAGNOSIS — I10 Essential (primary) hypertension: Secondary | ICD-10-CM

## 2022-11-17 ENCOUNTER — Other Ambulatory Visit: Payer: Self-pay | Admitting: Family Medicine

## 2022-11-17 DIAGNOSIS — I10 Essential (primary) hypertension: Secondary | ICD-10-CM

## 2022-11-19 ENCOUNTER — Other Ambulatory Visit: Payer: Self-pay

## 2022-11-19 DIAGNOSIS — I10 Essential (primary) hypertension: Secondary | ICD-10-CM

## 2022-11-19 MED ORDER — AMLODIPINE BESYLATE 5 MG PO TABS: 5.0000 mg | ORAL_TABLET | Freq: Every day | ORAL | 0 refills | Status: DC

## 2022-11-19 MED ORDER — METOPROLOL SUCCINATE ER 100 MG PO TB24: ORAL_TABLET | ORAL | 0 refills | Status: DC

## 2022-11-19 NOTE — Telephone Encounter (Signed)
Last OV-05/15/22 Last refill-04/28/21--10 tabs, 11 refills

## 2022-12-13 DIAGNOSIS — C069 Malignant neoplasm of mouth, unspecified: Secondary | ICD-10-CM | POA: Diagnosis not present

## 2022-12-25 ENCOUNTER — Other Ambulatory Visit: Payer: Self-pay | Admitting: Family Medicine

## 2022-12-25 DIAGNOSIS — I10 Essential (primary) hypertension: Secondary | ICD-10-CM

## 2022-12-27 ENCOUNTER — Other Ambulatory Visit: Payer: Self-pay

## 2022-12-27 DIAGNOSIS — I10 Essential (primary) hypertension: Secondary | ICD-10-CM

## 2022-12-27 MED ORDER — LOSARTAN POTASSIUM 50 MG PO TABS: 100.0000 mg | ORAL_TABLET | Freq: Every day | ORAL | 0 refills | Status: DC

## 2022-12-27 MED ORDER — HYDROCHLOROTHIAZIDE 25 MG PO TABS: 25.0000 mg | ORAL_TABLET | Freq: Every day | ORAL | 0 refills | Status: DC

## 2022-12-27 NOTE — Telephone Encounter (Signed)
Last OV-05/15/22 Patient requesting Phentermine capsules.    Previous prescription sent for Phentermine tablets.

## 2022-12-28 DIAGNOSIS — M1712 Unilateral primary osteoarthritis, left knee: Secondary | ICD-10-CM | POA: Diagnosis not present

## 2023-01-04 DIAGNOSIS — M1711 Unilateral primary osteoarthritis, right knee: Secondary | ICD-10-CM | POA: Diagnosis not present

## 2023-01-11 DIAGNOSIS — M1711 Unilateral primary osteoarthritis, right knee: Secondary | ICD-10-CM | POA: Diagnosis not present

## 2023-02-08 ENCOUNTER — Encounter: Payer: BLUE CROSS/BLUE SHIELD | Admitting: Family Medicine

## 2023-02-15 ENCOUNTER — Other Ambulatory Visit: Payer: Self-pay | Admitting: Family Medicine

## 2023-02-15 ENCOUNTER — Ambulatory Visit (INDEPENDENT_AMBULATORY_CARE_PROVIDER_SITE_OTHER): Payer: BC Managed Care – PPO | Admitting: Family Medicine

## 2023-02-15 ENCOUNTER — Encounter: Payer: Self-pay | Admitting: Family Medicine

## 2023-02-15 VITALS — BP 136/88 | HR 72 | Temp 98.3°F | Ht 67.0 in | Wt 247.0 lb

## 2023-02-15 DIAGNOSIS — J309 Allergic rhinitis, unspecified: Secondary | ICD-10-CM | POA: Diagnosis not present

## 2023-02-15 DIAGNOSIS — Z0000 Encounter for general adult medical examination without abnormal findings: Secondary | ICD-10-CM

## 2023-02-15 DIAGNOSIS — E291 Testicular hypofunction: Secondary | ICD-10-CM | POA: Diagnosis not present

## 2023-02-15 DIAGNOSIS — I10 Essential (primary) hypertension: Secondary | ICD-10-CM | POA: Diagnosis not present

## 2023-02-15 DIAGNOSIS — N529 Male erectile dysfunction, unspecified: Secondary | ICD-10-CM

## 2023-02-15 LAB — BASIC METABOLIC PANEL
BUN: 17 mg/dL (ref 6–23)
CO2: 26 mEq/L (ref 19–32)
Calcium: 9.1 mg/dL (ref 8.4–10.5)
Chloride: 100 mEq/L (ref 96–112)
Creatinine, Ser: 1.08 mg/dL (ref 0.40–1.50)
GFR: 75.3 mL/min (ref >60.00)
Glucose, Bld: 107 mg/dL — ABNORMAL HIGH (ref 70–99)
Potassium: 4.4 mEq/L (ref 3.5–5.1)
Sodium: 137 mEq/L (ref 135–145)

## 2023-02-15 LAB — CBC WITH DIFFERENTIAL/PLATELET
Basophils Absolute: 0.1 10*3/uL (ref 0.0–0.1)
Basophils Relative: 1.2 % (ref 0.0–3.0)
Eosinophils Absolute: 0.4 10*3/uL (ref 0.0–0.7)
Eosinophils Relative: 4.7 % (ref 0.0–5.0)
HCT: 46.3 % (ref 39.0–52.0)
Hemoglobin: 15.9 g/dL (ref 13.0–17.0)
Lymphocytes Relative: 24.9 % (ref 12.0–46.0)
Lymphs Abs: 2.2 10*3/uL (ref 0.7–4.0)
MCHC: 34.3 g/dL (ref 30.0–36.0)
MCV: 89.4 fl (ref 78.0–100.0)
Monocytes Absolute: 0.9 10*3/uL (ref 0.1–1.0)
Monocytes Relative: 9.8 % (ref 3.0–12.0)
Neutro Abs: 5.3 10*3/uL (ref 1.4–7.7)
Neutrophils Relative %: 59.4 % (ref 43.0–77.0)
Platelets: 330 10*3/uL (ref 150.0–400.0)
RBC: 5.18 Mil/uL (ref 4.22–5.81)
RDW: 13.7 % (ref 11.5–15.5)
WBC: 8.9 10*3/uL (ref 4.0–10.5)

## 2023-02-15 LAB — HEPATIC FUNCTION PANEL
ALT: 31 U/L (ref 0–53)
AST: 24 U/L (ref 0–37)
Albumin: 4.3 g/dL (ref 3.5–5.2)
Alkaline Phosphatase: 53 U/L (ref 39–117)
Bilirubin, Direct: 0.2 mg/dL (ref 0.0–0.3)
Total Bilirubin: 1 mg/dL (ref 0.2–1.2)
Total Protein: 7.2 g/dL (ref 6.0–8.3)

## 2023-02-15 LAB — LIPID PANEL
Cholesterol: 195 mg/dL (ref 0–200)
HDL: 49.3 mg/dL (ref >39.00)
LDL Cholesterol: 122 mg/dL — ABNORMAL HIGH (ref 0–99)
NonHDL: 146.18
Total CHOL/HDL Ratio: 4
Triglycerides: 119 mg/dL (ref 0.0–149.0)
VLDL: 23.8 mg/dL (ref 0.0–40.0)

## 2023-02-15 LAB — TESTOSTERONE: Testosterone: 629.14 ng/dL (ref 300.00–890.00)

## 2023-02-15 LAB — PSA: PSA: 2.7 ng/mL (ref 0.10–4.00)

## 2023-02-15 LAB — HEMOGLOBIN A1C: Hgb A1c MFr Bld: 6.2 % (ref 4.6–6.5)

## 2023-02-15 LAB — TSH: TSH: 2.72 u[IU]/mL (ref 0.35–5.50)

## 2023-02-15 MED ORDER — PROAIR RESPICLICK 108 (90 BASE) MCG/ACT IN AEPB: 1.0000 | INHALATION_SPRAY | RESPIRATORY_TRACT | 11 refills | Status: DC | PRN

## 2023-02-15 MED ORDER — METOPROLOL SUCCINATE ER 100 MG PO TB24: ORAL_TABLET | ORAL | 3 refills | Status: DC

## 2023-02-15 MED ORDER — TESTOSTERONE CYPIONATE 200 MG/ML IM SOLN
200.0000 mg | INTRAMUSCULAR | 1 refills | Status: DC
Start: 2023-02-15 — End: 2023-12-13

## 2023-02-15 MED ORDER — PHENTERMINE-TOPIRAMATE 11.25-69 MG PO CP24
1.0000 | ORAL_CAPSULE | Freq: Every morning | ORAL | 5 refills | Status: DC
Start: 2023-02-15 — End: 2023-03-06

## 2023-02-15 MED ORDER — FLUTICASONE PROPIONATE 50 MCG/ACT NA SUSP: 2.0000 | NASAL | 3 refills | Status: DC | PRN

## 2023-02-15 MED ORDER — AMLODIPINE BESYLATE 5 MG PO TABS: 5.0000 mg | ORAL_TABLET | Freq: Every day | ORAL | 3 refills | Status: DC

## 2023-02-15 MED ORDER — TADALAFIL 20 MG PO TABS: 20.0000 mg | ORAL_TABLET | Freq: Every day | ORAL | 11 refills | Status: DC | PRN

## 2023-02-15 MED ORDER — HYDROCHLOROTHIAZIDE 25 MG PO TABS: 25.0000 mg | ORAL_TABLET | Freq: Every day | ORAL | 3 refills | Status: DC

## 2023-02-15 MED ORDER — LOSARTAN POTASSIUM 50 MG PO TABS: 100.0000 mg | ORAL_TABLET | Freq: Every day | ORAL | 3 refills | Status: DC

## 2023-02-15 NOTE — Telephone Encounter (Signed)
Please advise 

## 2023-02-15 NOTE — Progress Notes (Signed)
Subjective:    Patient ID: Micheal Dominguez, male    DOB: [birth year], 57 y.o.   MRN: [id number]  HPI Here for a well exam. He has a few issues to discuss. First he says the Phentermine no longer curbs his appetite like it used to, and he struggles with cravings all day. Also the Viagra is not working well. He says all it does is give him "hot flashes".    Review of Systems  Constitutional: Negative.   HENT: Negative.    Eyes: Negative.   Respiratory: Negative.    Cardiovascular: Negative.   Gastrointestinal: Negative.   Genitourinary: Negative.   Musculoskeletal: Negative.   Skin: Negative.   Neurological: Negative.   Psychiatric/Behavioral: Negative.         Objective:   Physical Exam Constitutional:      General: He is not in acute distress.    Appearance: He is well-developed. He is obese. He is not diaphoretic.  HENT:     Head: Normocephalic and atraumatic.     Right Ear: External ear normal.     Left Ear: External ear normal.     Nose: Nose normal.     Mouth/Throat:     Pharynx: No oropharyngeal exudate.  Eyes:     General: No scleral icterus.       Right eye: No discharge.        Left eye: No discharge.     Conjunctiva/sclera: Conjunctivae normal.     Pupils: Pupils are equal, round, and reactive to light.  Neck:     Thyroid: No thyromegaly.     Vascular: No JVD.     Trachea: No tracheal deviation.  Cardiovascular:     Rate and Rhythm: Normal rate and regular rhythm.     Heart sounds: Normal heart sounds. No murmur heard.    No friction rub. No gallop.  Pulmonary:     Effort: Pulmonary effort is normal. No respiratory distress.     Breath sounds: Normal breath sounds. No wheezing or rales.  Chest:     Chest wall: No tenderness.  Abdominal:     General: Bowel sounds are normal. There is no distension.     Palpations: Abdomen is soft. There is no mass.     Tenderness: There is no abdominal tenderness. There is no guarding or rebound.  Genitourinary:     Penis: Normal. No tenderness.      Testes: Normal.     Prostate: Normal.     Rectum: Normal. Guaiac result negative.  Musculoskeletal:        General: No tenderness. Normal range of motion.     Cervical back: Neck supple.  Lymphadenopathy:     Cervical: No cervical adenopathy.  Skin:    General: Skin is warm and dry.     Coloration: Skin is not pale.     Findings: No erythema or rash.  Neurological:     Mental Status: He is alert and oriented to person, place, and time.     Cranial Nerves: No cranial nerve deficit.     Motor: No abnormal muscle tone.     Coordination: Coordination normal.     Deep Tendon Reflexes: Reflexes are normal and symmetric. Reflexes normal.  Psychiatric:        Behavior: Behavior normal.        Thought Content: Thought content normal.        Judgment: Judgment normal.           Assessment &  Plan:  Well exam. We discussed diet and exercise. Get fasting labs. Check a testosterone level. He will try Cialis 20 mg for the ED. For appetite suppression, we will change to Phentermine-Topiramate every morning.  Gershon Crane, MD

## 2023-02-15 NOTE — Addendum Note (Signed)
Addended by: Gershon Crane A on: 02/15/2023 04:49 PM   Modules accepted: Orders

## 2023-03-06 ENCOUNTER — Telehealth: Payer: Self-pay

## 2023-03-06 MED ORDER — PHENTERMINE HCL 30 MG PO CAPS: 30.0000 mg | ORAL_CAPSULE | ORAL | 1 refills | Status: DC

## 2023-03-06 MED ORDER — TOPIRAMATE 50 MG PO TABS: 50.0000 mg | ORAL_TABLET | Freq: Every day | ORAL | 1 refills | Status: DC

## 2023-03-06 NOTE — Addendum Note (Signed)
Addended by: Gershon Crane A on: 03/06/2023 01:09 PM   Modules accepted: Orders

## 2023-03-06 NOTE — Telephone Encounter (Signed)
Received faxed correspondence from [facility] that an alternative is being requested  b/c current medication is not covered by insurance.

## 2023-03-06 NOTE — Telephone Encounter (Signed)
I split the medication into it's two parts (Phentermine and Topiramate) and sent these in separately. It should be covered that way

## 2023-03-06 NOTE — Telephone Encounter (Signed)
Pt notified via My Chart

## 2023-03-28 ENCOUNTER — Other Ambulatory Visit: Payer: Self-pay

## 2023-03-29 ENCOUNTER — Encounter: Payer: Self-pay | Admitting: Family Medicine

## 2023-04-02 ENCOUNTER — Encounter: Payer: Self-pay | Admitting: Family Medicine

## 2023-04-02 ENCOUNTER — Other Ambulatory Visit: Payer: Self-pay

## 2023-04-02 NOTE — Telephone Encounter (Signed)
Pt has appointment with Dr Clent Ridges on 04/10/23

## 2023-04-02 NOTE — Telephone Encounter (Signed)
Pt's spouse called very upset that no one has answered her MyChart message.  Spouse has several issues with medication she says should not have been prescribed to Pt.   Spouse is asking for a call back ASAP!!

## 2023-04-02 NOTE — Telephone Encounter (Signed)
Pt has a f/u appointment regarding this message

## 2023-04-02 NOTE — Telephone Encounter (Signed)
Pt has been scheduled for OV with Dr Clent Ridges on 04/10/23 to discuss this issues

## 2023-04-03 ENCOUNTER — Other Ambulatory Visit: Payer: Self-pay

## 2023-04-09 ENCOUNTER — Other Ambulatory Visit: Payer: Self-pay

## 2023-04-09 ENCOUNTER — Encounter: Payer: Self-pay | Admitting: Family Medicine

## 2023-04-09 ENCOUNTER — Ambulatory Visit (INDEPENDENT_AMBULATORY_CARE_PROVIDER_SITE_OTHER): Payer: BC Managed Care – PPO | Admitting: Family Medicine

## 2023-04-09 VITALS — BP 130/86 | HR 86 | Temp 98.3°F | Wt 245.0 lb

## 2023-04-09 DIAGNOSIS — N529 Male erectile dysfunction, unspecified: Secondary | ICD-10-CM

## 2023-04-09 DIAGNOSIS — E669 Obesity, unspecified: Secondary | ICD-10-CM

## 2023-04-09 DIAGNOSIS — I4510 Unspecified right bundle-branch block: Secondary | ICD-10-CM

## 2023-04-09 DIAGNOSIS — E291 Testicular hypofunction: Secondary | ICD-10-CM

## 2023-04-09 NOTE — Progress Notes (Signed)
Subjective:    Patient ID: Micheal Dominguez, male    DOB: September 24, 1966, 57 y.o.   MRN: [id number]  HPI Here for several issues. First he wants to discuss weight loss medications. We were going to let him try a combination of Topiramate and Phentermine, but he was nervous about that due to his hx of RBBB (he had a normal stress test in 2019). He is interested in trying a GLP-1 agonist instead. He is interested in seeing a Cardiologist. He would also like to see a Urologist to help with his ED.    Review of Systems  Constitutional: Negative.   Respiratory: Negative.    Cardiovascular: Negative.        Objective:   Physical Exam Constitutional:      Appearance: Normal appearance.  Cardiovascular:     Rate and Rhythm: Normal rate and regular rhythm.     Pulses: Normal pulses.     Heart sounds: Normal heart sounds.  Pulmonary:     Effort: Pulmonary effort is normal.     Breath sounds: Normal breath sounds.  Neurological:     Mental Status: He is alert.           Assessment & Plan:  For his obesity, I agree he would be an excellent candidate for a GLP-1 agonist. I asked him to check with his insurance company to see if they will cover any of these medications. We will refer him to Cardiology as well as Urology. Gershon Crane, MD

## 2023-04-11 ENCOUNTER — Encounter: Payer: Self-pay | Admitting: Internal Medicine

## 2023-04-11 ENCOUNTER — Ambulatory Visit: Payer: BC Managed Care – PPO | Attending: Cardiology | Admitting: Internal Medicine

## 2023-04-11 VITALS — BP 152/88 | HR 63 | Ht 67.0 in | Wt 246.7 lb

## 2023-04-11 DIAGNOSIS — I4510 Unspecified right bundle-branch block: Secondary | ICD-10-CM

## 2023-04-11 DIAGNOSIS — E669 Obesity, unspecified: Secondary | ICD-10-CM

## 2023-04-11 NOTE — Patient Instructions (Signed)
Medication Instructions:  Your physician recommends that you continue on your current medications as directed. Please refer to the Current Medication list given to you today.  The Pharmacy group will call you about Wegovy  *If you need a refill on your cardiac medications before your next appointment, please call your pharmacy*   Lab Work: None If you have labs (blood work) drawn today and your tests are completely normal, you will receive your results only by: MyChart Message (if you have MyChart) OR A paper copy in the mail If you have any lab test that is abnormal or we need to change your treatment, we will call you to review the results.   Testing/Procedures: None   Follow-Up: At [facility], you and your health needs are our priority.  As part of our continuing mission to provide you with exceptional heart care, we have created designated Provider Care Teams.  These Care Teams include your primary Cardiologist (physician) and Advanced Practice Providers (APPs -  Physician Assistants and Nurse Practitioners) who all work together to provide you with the care you need, when you need it.  We recommend signing up for the patient portal called "MyChart".  Sign up information is provided on this After Visit Summary.  MyChart is used to connect with patients for Virtual Visits (Telemedicine).  Patients are able to view lab/test results, encounter notes, upcoming appointments, etc.  Non-urgent messages can be sent to your provider as well.   To learn more about what you can do with MyChart, go to [email or web address].    Your next appointment:   3 month(s)  Provider:   Luane School, MD    Other Instructions    Semaglutide Injection (Weight Management) What is this medication? SEMAGLUTIDE (SEM a GLOO tide) promotes weight loss. It may also be used to maintain weight loss. It works by decreasing appetite. Changes to diet and exercise are often combined with this  medication. This medicine may be used for other purposes; ask your health care provider or pharmacist if you have questions. COMMON BRAND NAME(S): [id number] What should I tell my care team before I take this medication? They need to know if you have any of these conditions: Endocrine tumors (MEN 2) or if someone in your family had these tumors Eye disease, vision problems Gallbladder disease History of depression or mental health disease History of pancreatitis Kidney disease Stomach or intestine problems Suicidal thoughts, plans, or attempt; a previous suicide attempt by you or a family member Thyroid cancer or if someone in your family had thyroid cancer An unusual or allergic reaction to semaglutide, other medications, foods, dyes, or preservatives Pregnant or trying to get pregnant Breast-feeding How should I use this medication? This medication is injected under the skin. You will be taught how to prepare and give it. Take it as directed on the prescription label. It is given once every week (every 7 days). Keep taking it unless your care team tells you to stop. It is important that you put your used needles and pens in a special sharps container. Do not put them in a trash can. If you do not have a sharps container, call your pharmacist or care team to get one. A special MedGuide will be given to you by the pharmacist with each prescription and refill. Be sure to read this information carefully each time. This medication comes with INSTRUCTIONS FOR USE. Ask your pharmacist for directions on how to use this medication. Read the information carefully. Talk  to your pharmacist or care team if you have questions. Talk to your care team about the use of this medication in children. While it may be prescribed for children as young as 12 years for selected conditions, precautions do apply. Overdosage: If you think you have taken too much of this medicine contact a poison control center or emergency  room at once. NOTE: This medicine is only for you. Do not share this medicine with others. What if I miss a dose? If you miss a dose and the next scheduled dose is more than 2 days away, take the missed dose as soon as possible. If you miss a dose and the next scheduled dose is less than 2 days away, do not take the missed dose. Take the next dose at your regular time. Do not take double or extra doses. If you miss your dose for 2 weeks or more, take the next dose at your regular time or call your care team to talk about how to restart this medication. What may interact with this medication? Insulin and other medications for diabetes This list may not describe all possible interactions. Give your health care provider a list of all the medicines, herbs, non-prescription drugs, or dietary supplements you use. Also tell them if you smoke, drink alcohol, or use illegal drugs. Some items may interact with your medicine. What should I watch for while using this medication? Visit your care team for regular checks on your progress. It may be some time before you see the benefit from this medication. Drink plenty of fluids while taking this medication. Check with your care team if you have severe diarrhea, nausea, and vomiting, or if you sweat a lot. The loss of too much body fluid may make it dangerous for you to take this medication. This medication may affect blood sugar levels. Ask your care team if changes in diet or medications are needed if you have diabetes. Talk to your care team if may be pregnant. Losing weight while pregnant is not advised and may cause harm to the fetus. Talk to your care team for more information. What side effects may I notice from receiving this medication? Side effects that you should report to your care team as soon as possible: Allergic reactions--skin rash, itching, hives, swelling of the face, lips, tongue, or throat Change in vision Dehydration--increased thirst, dry  mouth, feeling faint or lightheaded, headache, dark yellow or brown urine Gallbladder problems--severe stomach pain, nausea, vomiting, fever Heart palpitations--rapid, pounding, or irregular heartbeat Kidney injury--decrease in the amount of urine, swelling of the ankles, hands, or feet Pancreatitis--severe stomach pain that spreads to your back or gets worse after eating or when touched, fever, nausea, vomiting Thoughts of suicide or self-harm, worsening mood, feelings of depression Thyroid cancer--new mass or lump in the neck, pain or trouble swallowing, trouble breathing, hoarseness Side effects that usually do not require medical attention (report these to your care team if they continue or are bothersome): Diarrhea Loss of appetite Nausea Upset stomach This list may not describe all possible side effects. Call your doctor for medical advice about side effects. You may report side effects to FDA at 1-800-FDA-1088. Where should I keep my medication? Keep out of the reach of children and pets. Refrigeration (preferred): Store in the refrigerator. Do not freeze. Keep this medication in the original container until you are ready to take it. Get rid of any unused medication after the expiration date. Room temperature: If needed, prior to cap  removal, the pen can be stored at room temperature for up to 28 days. Protect from light. If it is stored at room temperature, get rid of any unused medication after 28 days or after it expires, whichever is first. It is important to get rid of the medication as soon as you no longer need it or it is expired. You can do this in two ways: Take the medication to a medication take-back program. Check with your pharmacy or law enforcement to find a location. If you cannot return the medication, follow the directions in the MedGuide. NOTE: This sheet is a summary. It may not cover all possible information. If you have questions about this medicine, talk to your  doctor, pharmacist, or health care provider.  2024 Elsevier/Gold Standard (2022-12-30 00:00:00)

## 2023-04-11 NOTE — Telephone Encounter (Signed)
The form is ready  

## 2023-04-12 DIAGNOSIS — E669 Obesity, unspecified: Secondary | ICD-10-CM | POA: Insufficient documentation

## 2023-04-12 NOTE — Progress Notes (Signed)
Cardiology Office Note  Date: 04/12/2023   ID: Micheal Dominguez, DOB [birth year], MRN [id number]  PCP:  Nelwyn Salisbury, MD  Cardiologist:  Marjo Bicker, MD Electrophysiologist:  None   Reason for Office Visit: Initiation of Wegovy at the request of Dr. Clent Ridges   History of Present Illness: Micheal Dominguez is a 57 y.o. male known to have HTN was referred to cardiology clinic to initiate [facility].  Patient has been trying to lose weight and has been unsuccessful. He was referred to cardiology clinic to initiate [facility]. Denies any symptoms of angina, DOE.  No palpitations.  No leg swelling.  Denies smoking cigarettes. His wife is a paramedic, he is on [place] and saw significant weight loss.   Past Medical History:  Diagnosis Date   Cancer (HCC)    jaw   Hypertension    RBBB    Hx - no problems    Past Surgical History:  Procedure Laterality Date   COLONOSCOPY  06/08/2016   per Dr. Marina Goodell, no polyps, has diverticulosis and int. hemorrhoids, repeat in 10 yrs    KNEE ARTHROSCOPY     right   KNEE ARTHROSCOPY WITH MEDIAL MENISECTOMY Left 01/21/2019   Procedure: Left knee arthroscopy with medial menisectomy, debridement, chondroplasty;  Surgeon: Ranee Gosselin, MD;  Location: [facility];  Service: Orthopedics;  Laterality: Left;    KNEE CLOSED REDUCTION Right 10/07/2019   Procedure: CLOSED MANIPULATION KNEE;  Surgeon: Ranee Gosselin, MD;  Location: [facility];  Service: Orthopedics;  Laterality: Right;    MANDIBLE SURGERY  09/25/2018   cancer- done at [facility]   TOTAL KNEE ARTHROPLASTY Right 08/19/2019   Procedure: TOTAL KNEE ARTHROPLASTY;  Surgeon: Ranee Gosselin, MD;  Location: [facility];  Service: Orthopedics;  Laterality: Right;     Current Outpatient Medications  Medication Sig Dispense Refill   Albuterol Sulfate (PROAIR RESPICLICK) 108 (90 Base) MCG/ACT AEPB Inhale 1 puff into the lungs every 4 (four) hours as needed (wheezing  or shortness of breath). 1 each 11   amLODipine (NORVASC) 5 MG tablet Take 1 tablet (5 mg total) by mouth daily. 90 tablet 3   Ascorbic Acid (VITAMIN C PO) Take 1,000 mg by mouth daily.      B-D 3CC LUER-LOK SYR 20GX1" 20G X 1" 3 ML MISC FOR USE EVERY 14 DAYS WITH INJECTION 6 each 49   fluticasone (FLONASE) 50 MCG/ACT nasal spray Place 2 sprays into both nostrils as needed for allergies or rhinitis. 48 mL 3   hydrochlorothiazide (HYDRODIURIL) 25 MG tablet Take 1 tablet (25 mg total) by mouth daily. 90 tablet 3   ibuprofen (ADVIL) 800 MG tablet Take 1 tablet (800 mg total) by mouth every 6 (six) hours as needed for moderate pain. 120 tablet 5   Krill Oil 350 MG CAPS Take 350 mg by mouth daily.      losartan (COZAAR) 50 MG tablet Take 2 tablets (100 mg total) by mouth daily. 180 tablet 3   MAGNESIUM PO Take 400 mg by mouth daily.      metoprolol succinate (TOPROL-XL) 100 MG 24 hr tablet Take with or immediately following a meal. 90 tablet 3   SUPER B COMPLEX/C PO Take 1 capsule by mouth daily.     Syringe/Needle, Disp, (SYRINGE 3CC/20GX1-1/2") 20G X 1-1/2" 3 ML MISC 1 application by Does not apply route every 14 (fourteen) days. 50 each 5   tadalafil (CIALIS) 20 MG tablet Take 1 tablet (  20 mg total) by mouth daily as needed for erectile dysfunction. 10 tablet 11   testosterone cypionate (DEPOTESTOSTERONE CYPIONATE) 200 MG/ML injection Inject 1 mL (200 mg total) into the muscle every 7 (seven) days. 12 mL 1   triamcinolone (KENALOG) 0.1 % Apply 1 application topically 2 (two) times daily. 45 g 5   No current facility-administered medications for this visit.   Allergies:  Contrast media [iodinated contrast media], Lisinopril, and Penicillins   Social History: The patient  reports that he has never smoked. His smokeless tobacco use includes chew. He reports current alcohol use of about 28.0 standard drinks of alcohol per week. He reports that he does not use drugs.   Family History: The patient's  family history includes Cancer in his brother, father, and another family member; Diabetes in an other family member; Heart disease in an other family member; Hyperlipidemia in an other family member; Hypertension in an other family member; Stroke in an other family member.   ROS:  Please see the history of present illness. Otherwise, complete review of systems is positive for none.  All other systems are reviewed and negative.   Physical Exam: VS:  BP (!) 152/88   Pulse 63   Ht 5\' 7"  (1.702 m)   Wt 246 lb 11.2 oz (111.9 kg)   SpO2 97%   BMI 38.64 kg/m , BMI Body mass index is 38.64 kg/m.  Wt Readings from Last 3 Encounters:  04/11/23 246 lb 11.2 oz (111.9 kg)  04/09/23 245 lb (111.1 kg)  02/15/23 247 lb (112 kg)    General: Patient appears comfortable at rest. HEENT: Conjunctiva and lids normal, oropharynx clear with moist mucosa. Neck: Supple, no elevated JVP or carotid bruits, no thyromegaly. Lungs: Clear to auscultation, nonlabored breathing at rest. Cardiac: Regular rate and rhythm, no S3 or significant systolic murmur, no pericardial rub. Abdomen: Soft, nontender, no hepatomegaly, bowel sounds present, no guarding or rebound. Extremities: No pitting edema, distal pulses 2+. Skin: Warm and dry. Musculoskeletal: No kyphosis. Neuropsychiatric: Alert and oriented x3, affect grossly appropriate.  Recent Labwork: 02/15/2023: ALT 31; AST 24; BUN 17; Creatinine, Ser 1.08; Hemoglobin 15.9; Platelets 330.0; Potassium 4.4; Sodium 137; TSH 2.72     Component Value Date/Time   CHOL 195 02/15/2023 0923   TRIG 119.0 02/15/2023 0923   HDL 49.30 02/15/2023 0923   CHOLHDL 4 02/15/2023 0923   VLDL 23.8 02/15/2023 0923   LDLCALC 122 (H) 02/15/2023 0923   LDLDIRECT 142.8 03/30/2010 0916    Assessment and Plan:  Patient is a 57 year old M known to have HTN was referred to cardiology clinic to initiate [facility].  # Obesity, BMI 38 # HTN -Pharm.D. referral to initiate Wegovy -Continue  current antihypertensive medications, amlodipine 5 mg once daily, metoprolol succinate 100 mg once daily, HCTZ 25 mg once daily and losartan 100 mg once daily.  HTN is currently managed by PCP.  Once adequate weight loss is achieved, he probably will benefit from cutting back on his current antihypertensive medications.  I have spent a total of 30 minutes with patient reviewing chart, EKGs, labs and examining patient as well as establishing an assessment and plan that was discussed with the patient.  > 50% of time was spent in direct patient care.    Medication Adjustments/Labs and Tests Ordered: Current medicines are reviewed at length with the patient today.  Concerns regarding medicines are outlined above.   Tests Ordered: Orders Placed This Encounter  Procedures   AMB Referral to [facility]  Pharm-D   EKG 12-Lead    Medication Changes: No orders of the defined types were placed in this encounter.   Disposition:  Follow up  3 months  Signed, Dareth Andrew Verne Spurr, MD, 04/12/2023 8:04 AM    [facility] at [facility] [address] [place], [place] [address]

## 2023-04-18 ENCOUNTER — Ambulatory Visit: Payer: BC Managed Care – PPO | Attending: Internal Medicine | Admitting: Pharmacist

## 2023-04-18 ENCOUNTER — Telehealth: Payer: Self-pay

## 2023-04-18 ENCOUNTER — Other Ambulatory Visit (HOSPITAL_COMMUNITY): Payer: Self-pay

## 2023-04-18 VITALS — Ht 67.0 in | Wt 244.2 lb

## 2023-04-18 DIAGNOSIS — E669 Obesity, unspecified: Secondary | ICD-10-CM | POA: Diagnosis not present

## 2023-04-18 DIAGNOSIS — E119 Type 2 diabetes mellitus without complications: Secondary | ICD-10-CM

## 2023-04-18 NOTE — Telephone Encounter (Signed)
Pharmacy Patient Advocate Encounter   Received notification from [facility] that prior authorization for [facility] is required/requested.   PA submitted to [facility] via CoverMyMeds Key or (Medicaid) confirmation # [id number]  Status is pending

## 2023-04-18 NOTE — Telephone Encounter (Signed)
Pharmacy Patient Advocate Encounter  Received notification from [facility] that the request for prior authorization for [facility] has been denied due to      *See full denial in patients media.

## 2023-04-18 NOTE — Patient Instructions (Addendum)
It was nice meeting you today  I will complete the prior authorization for your [facility]  Your dose will be 0.25mg  once a week for 4 weeks and the next dose is 0.5mg  once a week for 4 weeks  Once the prior authorization is approved, I recommend going to www.wegovy.com and signing up for a copay card. Click on Cost, Savings, and Support, then click on "save on wegovy"  I will contact you when the prior authorization is approved  Laural Golden, PharmD, BCACP, CDCES, CPP [address] [place], [place], [address] Phone: [phone], Fax: [phone]

## 2023-04-18 NOTE — Progress Notes (Signed)
Patient ID: GRANTLEY STAN                 DOB: [birth year]                    MRN: [id number]     HPI: Micheal Dominguez is a 57 y.o. male patient referred to pharmacy clinic by Dr Brett Canales to initiate weight loss therapy with GLP1-RA. PMH is significant for obesity, HTN, RBBB, and HLD. Most recent BMI 38.24.  Has A1c of 6.5 on 01/05/22  Patient presents today in good spirits. 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 Wife is EMT who currenlty takes [place] with good results.  Believes his biggest issue is stress eating due to his job and he does not always make the best choices and then will also overeat.  Labs: Lab Results  Component Value Date   HGBA1C 6.2 02/15/2023    Wt Readings from Last 1 Encounters:  04/11/23 246 lb 11.2 oz (111.9 kg)    BP Readings from Last 1 Encounters:  04/11/23 (!) 152/88   Pulse Readings from Last 1 Encounters:  04/11/23 63       Component Value Date/Time   CHOL 195 02/15/2023 0923   TRIG 119.0 02/15/2023 0923   HDL 49.30 02/15/2023 0923   CHOLHDL 4 02/15/2023 0923   VLDL 23.8 02/15/2023 0923   LDLCALC 122 (H) 02/15/2023 0923   LDLDIRECT 142.8 03/30/2010 0916    Past Medical History:  Diagnosis Date   Cancer (HCC)    jaw   Hypertension    RBBB    Hx - no problems    Current Outpatient Medications on File Prior to Visit  Medication Sig Dispense Refill   Albuterol Sulfate (PROAIR RESPICLICK) 108 (90 Base) MCG/ACT AEPB Inhale 1 puff into the lungs every 4 (four) hours as needed (wheezing or shortness of breath). 1 each 11   amLODipine (NORVASC) 5 MG tablet Take 1 tablet (5 mg total) by mouth daily. 90 tablet 3   Ascorbic Acid (VITAMIN C PO) Take 1,000 mg by mouth daily.      B-D 3CC LUER-LOK SYR 20GX1" 20G X 1" 3 ML MISC FOR USE EVERY 14 DAYS WITH INJECTION 6 each 49   fluticasone (FLONASE) 50 MCG/ACT nasal spray Place 2 sprays into both nostrils as needed for allergies or rhinitis. 48 mL 3    hydrochlorothiazide (HYDRODIURIL) 25 MG tablet Take 1 tablet (25 mg total) by mouth daily. 90 tablet 3   ibuprofen (ADVIL) 800 MG tablet Take 1 tablet (800 mg total) by mouth every 6 (six) hours as needed for moderate pain. 120 tablet 5   Krill Oil 350 MG CAPS Take 350 mg by mouth daily.      losartan (COZAAR) 50 MG tablet Take 2 tablets (100 mg total) by mouth daily. 180 tablet 3   MAGNESIUM PO Take 400 mg by mouth daily.      metoprolol succinate (TOPROL-XL) 100 MG 24 hr tablet Take with or immediately following a meal. 90 tablet 3   SUPER B COMPLEX/C PO Take 1 capsule by mouth daily.     Syringe/Needle, Disp, (SYRINGE 3CC/20GX1-1/2") 20G X 1-1/2" 3 ML MISC 1 application by Does not apply route every 14 (fourteen) days. 50 each 5   tadalafil (CIALIS) 20 MG tablet Take 1 tablet (20 mg total) by mouth daily as needed for erectile dysfunction. 10 tablet 11   testosterone cypionate (DEPOTESTOSTERONE CYPIONATE) 200  MG/ML injection Inject 1 mL (200 mg total) into the muscle every 7 (seven) days. 12 mL 1   triamcinolone (KENALOG) 0.1 % Apply 1 application topically 2 (two) times daily. 45 g 5   No current facility-administered medications on file prior to visit.    Allergies  Allergen Reactions   Contrast Media [Iodinated Contrast Media] Other (See Comments) and Cough    Uncontrollable gagging/coughing   Lisinopril Cough   Penicillins Other (See Comments)    Did it involve swelling of the face/tongue/throat, SOB, or low BP? Unknown Did it involve sudden or severe rash/hives, skin peeling, or any reaction on the inside of your mouth or nose? Unknown Did you need to seek medical attention at a hospital or doctor's office? Yes When did it last happen? Infantile Reaction     If all above answers are "NO", may proceed with cephalosporin use. Childhood reaction     Assessment/Plan:  1. Weight loss - Patient BMI 38.25 kg/m2. Checked website and his plan should cover Wegovy with PA.  Using demo  pen, educated patient on mechansim of action, storage, site selection, and possible adverse effects. Explained dose titration schedule. Will complete PA and contact patient when approved. Patient will follow up via phone or mychart for dose increases. Confirmed patient has no personal or family history of medullary thyroid carcinoma (MTC) or Multiple Endocrine Neoplasia syndrome type 2 (MEN 2). Injection technique reviewed at today's visit.  Advised patient on common side effects including nausea, diarrhea, dyspepsia, decreased appetite, and fatigue. Counseled patient on reducing meal size and how to titrate medication to minimize side effects. Counseled patient to call if intolerable side effects or if experiencing dehydration, abdominal pain, or dizziness. Patient will adhere to dietary modifications and will target at least 150 minutes of moderate intensity exercise weekly.   Start [employer] 0.25mg  sq weekly  Laural Golden, PharmD, BCACP, CDCES, CPP [address], [address] [place], [place], [address] Phone: [phone], Fax: [phone]

## 2023-04-19 ENCOUNTER — Encounter: Payer: Self-pay | Admitting: Pharmacist

## 2023-04-25 ENCOUNTER — Telehealth: Payer: Self-pay

## 2023-04-25 DIAGNOSIS — E291 Testicular hypofunction: Secondary | ICD-10-CM

## 2023-04-25 NOTE — Telephone Encounter (Signed)
PA request for Testosterone Cypionate 200MG /ML intramuscular solution has been DENIED:   Denied. The request for coverage of Testosterone Cypionate is denied. This medication is covered for males with two low testosterone levels collected in the early morning. Based on the records provided, only one low testosterone level was sent in for review and it was not collected in the early morning. Other levels were normal.

## 2023-04-29 NOTE — Telephone Encounter (Signed)
Have him schedule two different dates in the early mornings (one week apart) to check testosterone levels

## 2023-04-30 ENCOUNTER — Encounter: Payer: Self-pay | Admitting: Internal Medicine

## 2023-04-30 NOTE — Telephone Encounter (Signed)
Lab appointment scheduled and labs ordered.

## 2023-04-30 NOTE — Addendum Note (Signed)
Addended by: Kern Reap B on: 04/30/2023 03:23 PM   Modules accepted: Orders

## 2023-05-01 ENCOUNTER — Other Ambulatory Visit (INDEPENDENT_AMBULATORY_CARE_PROVIDER_SITE_OTHER): Payer: BC Managed Care – PPO

## 2023-05-01 DIAGNOSIS — E291 Testicular hypofunction: Secondary | ICD-10-CM | POA: Diagnosis not present

## 2023-05-01 LAB — TESTOSTERONE: Testosterone: 759.4 ng/dL (ref 300.00–890.00)

## 2023-05-01 NOTE — Telephone Encounter (Signed)
Completed PA for Ozempic. Key [id number]

## 2023-05-03 MED ORDER — OZEMPIC (0.25 OR 0.5 MG/DOSE) 2 MG/3ML ~~LOC~~ SOPN
PEN_INJECTOR | SUBCUTANEOUS | 0 refills | Status: DC
Start: 2023-05-03 — End: 2023-06-10

## 2023-05-03 NOTE — Addendum Note (Signed)
Addended by: Cheree Ditto on: 05/03/2023 01:52 PM   Modules accepted: Orders

## 2023-05-03 NOTE — Telephone Encounter (Signed)
Ozempic approved through 04/29/24

## 2023-05-07 ENCOUNTER — Other Ambulatory Visit (INDEPENDENT_AMBULATORY_CARE_PROVIDER_SITE_OTHER): Payer: BC Managed Care – PPO

## 2023-05-07 DIAGNOSIS — E291 Testicular hypofunction: Secondary | ICD-10-CM

## 2023-05-07 LAB — TESTOSTERONE: Testosterone: 605.77 ng/dL (ref 300.00–890.00)

## 2023-06-10 ENCOUNTER — Other Ambulatory Visit: Payer: Self-pay | Admitting: Internal Medicine

## 2023-06-10 DIAGNOSIS — E119 Type 2 diabetes mellitus without complications: Secondary | ICD-10-CM

## 2023-06-11 ENCOUNTER — Other Ambulatory Visit: Payer: Self-pay | Admitting: Internal Medicine

## 2023-06-11 DIAGNOSIS — E119 Type 2 diabetes mellitus without complications: Secondary | ICD-10-CM

## 2023-06-11 MED ORDER — OZEMPIC (0.25 OR 0.5 MG/DOSE) 2 MG/3ML ~~LOC~~ SOPN
PEN_INJECTOR | SUBCUTANEOUS | 0 refills | Status: DC
Start: 2023-06-11 — End: 2023-07-02

## 2023-06-12 ENCOUNTER — Ambulatory Visit: Payer: BC Managed Care – PPO | Admitting: Cardiology

## 2023-07-02 ENCOUNTER — Other Ambulatory Visit: Payer: Self-pay | Admitting: Internal Medicine

## 2023-07-02 DIAGNOSIS — E119 Type 2 diabetes mellitus without complications: Secondary | ICD-10-CM

## 2023-07-04 DIAGNOSIS — M1712 Unilateral primary osteoarthritis, left knee: Secondary | ICD-10-CM | POA: Diagnosis not present

## 2023-07-05 ENCOUNTER — Telehealth: Payer: Self-pay | Admitting: Pharmacy Technician

## 2023-07-05 NOTE — Telephone Encounter (Signed)
 Clinical questions answered. PA submitted

## 2023-07-05 NOTE — Telephone Encounter (Signed)
Pharmacy Patient Advocate Encounter   Received notification from CoverMyMeds that prior authorization for Testosterone Cypionate 200MG /ML intramuscular solution is required/requested.   Insurance verification completed.   The patient is insured through [facility] .   Per test claim: PA required; PA started via CoverMyMeds. KEY [id number] . Waiting for clinical questions to populate.

## 2023-07-09 ENCOUNTER — Encounter: Payer: Self-pay | Admitting: Pharmacist

## 2023-07-09 DIAGNOSIS — E119 Type 2 diabetes mellitus without complications: Secondary | ICD-10-CM

## 2023-07-09 MED ORDER — OZEMPIC (1 MG/DOSE) 4 MG/3ML ~~LOC~~ SOPN
1.0000 mg | PEN_INJECTOR | SUBCUTANEOUS | 0 refills | Status: AC
Start: 2023-07-09 — End: ?

## 2023-07-09 NOTE — Addendum Note (Signed)
Addended by: Cheree Ditto on: 07/09/2023 01:56 PM   Modules accepted: Orders

## 2023-07-10 NOTE — Telephone Encounter (Addendum)
PA resubmitted. New key: [id number]

## 2023-07-12 NOTE — Telephone Encounter (Signed)
Pharmacy Patient Advocate Encounter  Received notification from [facility] that Prior Authorization for Testosterone Cypionate 200MG /ML  has been DENIED.  Full denial letter will be uploaded to the media tab. See denial reason below.   PA #/Case ID/Reference #: [id number]

## 2023-07-15 DIAGNOSIS — M1712 Unilateral primary osteoarthritis, left knee: Secondary | ICD-10-CM | POA: Diagnosis not present

## 2023-07-15 NOTE — Telephone Encounter (Addendum)
The only 2 low results was on 01/26/22 and 11/25/20.  The 01/26/22 results were not collected early morning, Time shows 2:42.

## 2023-07-19 NOTE — Telephone Encounter (Signed)
FYI

## 2023-07-23 DIAGNOSIS — M1712 Unilateral primary osteoarthritis, left knee: Secondary | ICD-10-CM | POA: Diagnosis not present

## 2023-07-30 DIAGNOSIS — M1712 Unilateral primary osteoarthritis, left knee: Secondary | ICD-10-CM | POA: Diagnosis not present

## 2023-07-31 ENCOUNTER — Encounter: Payer: Self-pay | Admitting: Internal Medicine

## 2023-07-31 ENCOUNTER — Ambulatory Visit: Payer: BC Managed Care – PPO | Attending: Internal Medicine | Admitting: Internal Medicine

## 2023-07-31 VITALS — BP 130/80 | HR 83 | Ht 67.0 in | Wt 239.8 lb

## 2023-07-31 DIAGNOSIS — E119 Type 2 diabetes mellitus without complications: Secondary | ICD-10-CM | POA: Diagnosis not present

## 2023-07-31 DIAGNOSIS — Z136 Encounter for screening for cardiovascular disorders: Secondary | ICD-10-CM | POA: Insufficient documentation

## 2023-07-31 NOTE — Progress Notes (Signed)
Cardiology Office Note  Date: 07/31/2023   ID: Broc, Parks [birth year], MRN [id number]  PCP:  Nelwyn Salisbury, MD  Cardiologist:  Marjo Bicker, MD Electrophysiologist:  None    History of Present Illness: Micheal Dominguez is a 57 y.o. male   HbA1c 6.5 around 1 year ago, currently on Ozempic based on this criteria.  Lost around 8 pounds of weight in the last 3 months.  Following with Pharm.D. for Ozempic titration and refills.  Asymptomatic, no angina, DOE, orthopnea, PND, presyncope, syncope, palpitations or leg swelling.  Overall doing great.  Family history of heart attacks present.   Past Medical History:  Diagnosis Date   Cancer (HCC)    jaw   Hypertension    RBBB    Hx - no problems    Past Surgical History:  Procedure Laterality Date   COLONOSCOPY  06/08/2016   per Dr. Marina Goodell, no polyps, has diverticulosis and int. hemorrhoids, repeat in 10 yrs    KNEE ARTHROSCOPY     right   KNEE ARTHROSCOPY WITH MEDIAL MENISECTOMY Left 01/21/2019   Procedure: Left knee arthroscopy with medial menisectomy, debridement, chondroplasty;  Surgeon: Ranee Gosselin, MD;  Location: [facility];  Service: Orthopedics;  Laterality: Left;    KNEE CLOSED REDUCTION Right 10/07/2019   Procedure: CLOSED MANIPULATION KNEE;  Surgeon: Ranee Gosselin, MD;  Location: [facility];  Service: Orthopedics;  Laterality: Right;    MANDIBLE SURGERY  09/25/2018   cancer- done at [facility]   TOTAL KNEE ARTHROPLASTY Right 08/19/2019   Procedure: TOTAL KNEE ARTHROPLASTY;  Surgeon: Ranee Gosselin, MD;  Location: [facility];  Service: Orthopedics;  Laterality: Right;     Current Outpatient Medications  Medication Sig Dispense Refill   Albuterol Sulfate (PROAIR RESPICLICK) 108 (90 Base) MCG/ACT AEPB Inhale 1 puff into the lungs every 4 (four) hours as needed (wheezing or shortness of breath). 1 each 11   amLODipine (NORVASC) 5 MG tablet Take 1 tablet (5 mg  total) by mouth daily. 90 tablet 3   Ascorbic Acid (VITAMIN C PO) Take 1,000 mg by mouth daily.      B-D 3CC LUER-LOK SYR 20GX1" 20G X 1" 3 ML MISC FOR USE EVERY 14 DAYS WITH INJECTION 6 each 49   fluticasone (FLONASE) 50 MCG/ACT nasal spray Place 2 sprays into both nostrils as needed for allergies or rhinitis. 48 mL 3   hydrochlorothiazide (HYDRODIURIL) 25 MG tablet Take 1 tablet (25 mg total) by mouth daily. 90 tablet 3   ibuprofen (ADVIL) 800 MG tablet Take 1 tablet (800 mg total) by mouth every 6 (six) hours as needed for moderate pain. 120 tablet 5   Krill Oil 350 MG CAPS Take 350 mg by mouth daily.      losartan (COZAAR) 50 MG tablet Take 2 tablets (100 mg total) by mouth daily. 180 tablet 3   MAGNESIUM PO Take 400 mg by mouth daily.      metoprolol succinate (TOPROL-XL) 100 MG 24 hr tablet Take with or immediately following a meal. 90 tablet 3   Semaglutide, 1 MG/DOSE, (OZEMPIC, 1 MG/DOSE,) 4 MG/3ML SOPN Inject 1 mg into the skin once a week. 3 mL 0   SUPER B COMPLEX/C PO Take 1 capsule by mouth daily.     Syringe/Needle, Disp, (SYRINGE 3CC/20GX1-1/2") 20G X 1-1/2" 3 ML MISC 1 application by Does not apply route every 14 (fourteen) days. 50 each 5   tadalafil (CIALIS) 20  MG tablet Take 1 tablet (20 mg total) by mouth daily as needed for erectile dysfunction. 10 tablet 11   testosterone cypionate (DEPOTESTOSTERONE CYPIONATE) 200 MG/ML injection Inject 1 mL (200 mg total) into the muscle every 7 (seven) days. 12 mL 1   triamcinolone (KENALOG) 0.1 % Apply 1 application topically 2 (two) times daily. 45 g 5   No current facility-administered medications for this visit.   Allergies:  Contrast media [iodinated contrast media], Lisinopril, and Penicillins   Social History: The patient  reports that he has never smoked. His smokeless tobacco use includes chew. He reports current alcohol use of about 28.0 standard drinks of alcohol per week. He reports that he does not use drugs.   Family  History: The patient's family history includes Cancer in his brother, father, and another family member; Diabetes in an other family member; Heart disease in an other family member; Hyperlipidemia in an other family member; Hypertension in an other family member; Stroke in an other family member.   ROS:  Please see the history of present illness. Otherwise, complete review of systems is positive for none.  All other systems are reviewed and negative.   Physical Exam: VS:  BP 130/80   Pulse 83   Ht 5\' 7"  (1.702 m)   Wt 239 lb 12.8 oz (108.8 kg)   SpO2 96%   BMI 37.56 kg/m , BMI Body mass index is 37.56 kg/m.  Wt Readings from Last 3 Encounters:  07/31/23 239 lb 12.8 oz (108.8 kg)  04/18/23 244 lb 3.2 oz (110.8 kg)  04/11/23 246 lb 11.2 oz (111.9 kg)    General: Patient appears comfortable at rest. HEENT: Conjunctiva and lids normal, oropharynx clear with moist mucosa. Neck: Supple, no elevated JVP or carotid bruits, no thyromegaly. Lungs: Clear to auscultation, nonlabored breathing at rest. Cardiac: Regular rate and rhythm, no S3 or significant systolic murmur, no pericardial rub. Abdomen: Soft, nontender, no hepatomegaly, bowel sounds present, no guarding or rebound. Extremities: No pitting edema, distal pulses 2+. Skin: Warm and dry. Musculoskeletal: No kyphosis. Neuropsychiatric: Alert and oriented x3, affect grossly appropriate.  Recent Labwork: 02/15/2023: ALT 31; AST 24; BUN 17; Creatinine, Ser 1.08; Hemoglobin 15.9; Platelets 330.0; Potassium 4.4; Sodium 137; TSH 2.72     Component Value Date/Time   CHOL 195 02/15/2023 0923   TRIG 119.0 02/15/2023 0923   HDL 49.30 02/15/2023 0923   CHOLHDL 4 02/15/2023 0923   VLDL 23.8 02/15/2023 0923   LDLCALC 122 (H) 02/15/2023 0923   LDLDIRECT 142.8 03/30/2010 0916    Assessment and Plan:  Screening of CAD: Family history of heart attacks present.  Obtain CAC and lipoprotein a levels.  Asymptomatic, no angina or DOE.  I reviewed  the lipid panel from 2024 that showed elevated LDL 122, TG 119.  Not on statins, on Ozempic.  Diet and exercise counseling provided.  Diabetes mellitus type 2 with morbid obesity: HbA1c was 6.5 around 1 year ago, 6.2 around 5 months ago.  Currently on Ozempic.  Follow-up with Pharm.D.  HTN, controlled: Continue amlodipine 5 mg once daily, insidiously 25 mg once daily, losartan 100 mg once daily and metoprolol succinate 100 mg once daily.  Follow-up with PCP.   Tests Ordered: Orders Placed This Encounter  Procedures   CT CARDIAC SCORING (SELF PAY ONLY)   Lipoprotein A (LPA)    Medication Changes: No orders of the defined types were placed in this encounter.   Disposition:  Follow up  pending results  Signed,  Charlann Wayne Verne Spurr, MD, 07/31/2023 3:03 PM    [facility] at [facility] [address], [place], [place] [address]

## 2023-07-31 NOTE — Patient Instructions (Signed)
Medication Instructions:  Your physician recommends that you continue on your current medications as directed. Please refer to the Current Medication list given to you today.  *If you need a refill on your cardiac medications before your next appointment, please call your pharmacy*   Lab Work: LP(a)  If you have labs (blood work) drawn today and your tests are completely normal, you will receive your results only by: MyChart Message (if you have MyChart) OR A paper copy in the mail If you have any lab test that is abnormal or we need to change your treatment, we will call you to review the results.   Testing/Procedures: Coronary Calcium Score- $99 out of pocket   Follow-Up: At [facility], you and your health needs are our priority.  As part of our continuing mission to provide you with exceptional heart care, we have created designated Provider Care Teams.  These Care Teams include your primary Cardiologist (physician) and Advanced Practice Providers (APPs -  Physician Assistants and Nurse Practitioners) who all work together to provide you with the care you need, when you need it.  We recommend signing up for the patient portal called "MyChart".  Sign up information is provided on this After Visit Summary.  MyChart is used to connect with patients for Virtual Visits (Telemedicine).  Patients are able to view lab/test results, encounter notes, upcoming appointments, etc.  Non-urgent messages can be sent to your provider as well.   To learn more about what you can do with MyChart, go to [email or web address].    Your next appointment:    Follow up pending.   Provider:   You may see Vishnu P Mallipeddi, MD or one of the following Advanced Practice Providers on your designated Care Team:   [place], PA-C  Jacolyn Reedy, [place]     Other Instructions

## 2023-08-01 DIAGNOSIS — N529 Male erectile dysfunction, unspecified: Secondary | ICD-10-CM | POA: Diagnosis not present

## 2023-08-01 DIAGNOSIS — N486 Induration penis plastica: Secondary | ICD-10-CM | POA: Diagnosis not present

## 2023-08-05 ENCOUNTER — Other Ambulatory Visit: Payer: Self-pay | Admitting: Internal Medicine

## 2023-08-05 DIAGNOSIS — E119 Type 2 diabetes mellitus without complications: Secondary | ICD-10-CM

## 2023-08-05 MED ORDER — OZEMPIC (1 MG/DOSE) 4 MG/3ML ~~LOC~~ SOPN: 1.0000 mg | PEN_INJECTOR | SUBCUTANEOUS | 1 refills | Status: DC

## 2023-08-16 ENCOUNTER — Ambulatory Visit: Payer: BC Managed Care – PPO | Admitting: Family Medicine

## 2023-08-16 ENCOUNTER — Encounter: Payer: Self-pay | Admitting: Family Medicine

## 2023-08-16 ENCOUNTER — Telehealth: Payer: Self-pay

## 2023-08-16 VITALS — BP 110/80 | HR 82 | Temp 98.4°F | Resp 16 | Ht 67.0 in | Wt 235.0 lb

## 2023-08-16 DIAGNOSIS — R1013 Epigastric pain: Secondary | ICD-10-CM

## 2023-08-16 DIAGNOSIS — K921 Melena: Secondary | ICD-10-CM | POA: Diagnosis not present

## 2023-08-16 DIAGNOSIS — R112 Nausea with vomiting, unspecified: Secondary | ICD-10-CM | POA: Diagnosis not present

## 2023-08-16 DIAGNOSIS — K219 Gastro-esophageal reflux disease without esophagitis: Secondary | ICD-10-CM

## 2023-08-16 DIAGNOSIS — R748 Abnormal levels of other serum enzymes: Secondary | ICD-10-CM

## 2023-08-16 LAB — CBC
HCT: 56 % — ABNORMAL HIGH (ref 39.0–52.0)
Hemoglobin: 18 g/dL (ref 13.0–17.0)
MCHC: 32.2 g/dL (ref 30.0–36.0)
MCV: 93 fL (ref 78.0–100.0)
Platelets: 312 10*3/uL (ref 150.0–400.0)
RBC: 6.02 Mil/uL — ABNORMAL HIGH (ref 4.22–5.81)
RDW: 12.9 % (ref 11.5–15.5)
WBC: 10.5 10*3/uL (ref 4.0–10.5)

## 2023-08-16 LAB — BASIC METABOLIC PANEL
BUN: 11 mg/dL (ref 6–23)
CO2: 28 meq/L (ref 19–32)
Calcium: 8.9 mg/dL (ref 8.4–10.5)
Chloride: 99 meq/L (ref 96–112)
Creatinine, Ser: 1.15 mg/dL (ref 0.40–1.50)
GFR: 69.59 mL/min (ref >60.00)
Glucose, Bld: 94 mg/dL (ref 70–99)
Potassium: 4.1 meq/L (ref 3.5–5.1)
Sodium: 135 meq/L (ref 135–145)

## 2023-08-16 LAB — LIPASE: Lipase: 104 U/L — ABNORMAL HIGH (ref 11.0–59.0)

## 2023-08-16 MED ORDER — PANTOPRAZOLE SODIUM 40 MG PO TBEC
40.0000 mg | DELAYED_RELEASE_TABLET | Freq: Two times a day (BID) | ORAL | 1 refills | Status: DC
Start: 2023-08-16 — End: 2023-09-13

## 2023-08-16 MED ORDER — ONDANSETRON HCL 4 MG PO TABS: 4.0000 mg | ORAL_TABLET | Freq: Three times a day (TID) | ORAL | 0 refills | Status: AC | PRN

## 2023-08-16 NOTE — Patient Instructions (Addendum)
A few things to remember from today's visit:  Gastroesophageal reflux disease, unspecified whether esophagitis present - Plan: pantoprazole (PROTONIX) 40 MG tablet, Ambulatory referral to Gastroenterology  Melena - Plan: Ambulatory referral to Gastroenterology  Epigastric abdominal pain - Plan: pantoprazole (PROTONIX) 40 MG tablet, Lipase, CBC, Basic metabolic panel  Nausea and vomiting in adult - Plan: ondansetron (ZOFRAN) 4 MG tablet Pantoprazole 40 mg 2 times daily for 4-6 weeks then once daily.  Do not use My Chart to request refills or for acute issues that need immediate attention. If you send a my chart message, it may take a few days to be addressed, specially if I am not in the office.  Please be sure medication list is accurate. If a new problem present, please set up appointment sooner than planned today.

## 2023-08-16 NOTE — Progress Notes (Signed)
ACUTE VISIT Chief Complaint  Patient presents with   Abdominal Pain    Pain started getting worse yesterday, has been ongoing. Pt stated it feels like a stabbing pain    HPI: [name] is a 57 y.o. male with a PMHx significant for HTN, lung cancer, HLD, psoriasis, and RBBB, who is here today with his wife complaining of abdominal pain, nausea, and vomiting as described above.   He complains of a couple weeks of intermittent epigastric stabbing abdominal pain that worsened yesterday. He says it was constant yesterday, and rated it as a 9/10. He denies radiation of the pain.  Abdominal Pain This is a new problem. The current episode started 1 to 4 weeks ago. The onset quality is gradual. The problem occurs intermittently. The problem has been gradually improving. The pain is located in the epigastric region. The pain is severe. The abdominal pain does not radiate. Associated symptoms include belching, diarrhea, flatus, melena, nausea and vomiting. Pertinent negatives include no anorexia, arthralgias, dysuria, fever, frequency, headaches, hematochezia, hematuria or myalgias. The pain is aggravated by eating. The pain is relieved by Nothing. He has tried proton pump inhibitors for the symptoms. The treatment provided mild relief.   He endorses recent constipation and black stool. He says he had diarrhea this morning which was also black, but attributes that to eating ice cream last night because he is lactose intolerant. His last bowel movement before that was 3-4 days ago and was hard. He says his pain has improved to a 1/10 after the bowel movement this morning.  He has not identified exacerbating or alleviating factors.  He also endorses new  fatigue, lower retrosternal chest tightness one time, acid reflux. + Nausea and belching, which reports as intermittent for a while but worse since above symptoms started.   He denies appetite changes, sore throat, dysphagia, SOB, palpitations,or  diaphoresis. DM II on Semaglutide 1 mg weekly.  His wife is concerned about possible H. Pylori infection.  His wife gave him over the counter omeprazole last night.   Lab Results  Component Value Date   NA 137 02/15/2023   CL 100 02/15/2023   K 4.4 02/15/2023   CO2 26 02/15/2023   BUN 17 02/15/2023   CREATININE 1.08 02/15/2023   GFR 75.30 02/15/2023   CALCIUM 9.1 02/15/2023   ALBUMIN 4.3 02/15/2023   GLUCOSE 107 (H) 02/15/2023   Lab Results  Component Value Date   WBC 8.9 02/15/2023   HGB 15.9 02/15/2023   HCT 46.3 02/15/2023   MCV 89.4 02/15/2023   PLT 330.0 02/15/2023   Review of Systems  Constitutional:  Positive for activity change and appetite change. Negative for fever.  Respiratory:  Negative for cough, shortness of breath and wheezing.   Cardiovascular:  Negative for leg swelling.  Gastrointestinal:  Positive for abdominal pain, diarrhea, flatus, melena, nausea and vomiting. Negative for anorexia and hematochezia.  Genitourinary:  Negative for decreased urine volume, dysuria, frequency and hematuria.  Musculoskeletal:  Negative for arthralgias and myalgias.  Skin:  Negative for rash.  Neurological:  Negative for syncope and headaches.  See other pertinent positives and negatives in HPI.  Current Outpatient Medications on File Prior to Visit  Medication Sig Dispense Refill   Albuterol Sulfate (PROAIR RESPICLICK) 108 (90 Base) MCG/ACT AEPB Inhale 1 puff into the lungs every 4 (four) hours as needed (wheezing or shortness of breath). 1 each 11   amLODipine (NORVASC) 5 MG tablet Take 1 tablet (5 mg total)  by mouth daily. 90 tablet 3   Ascorbic Acid (VITAMIN C PO) Take 1,000 mg by mouth daily.      B-D 3CC LUER-LOK SYR 20GX1" 20G X 1" 3 ML MISC FOR USE EVERY 14 DAYS WITH INJECTION 6 each 49   fluticasone (FLONASE) 50 MCG/ACT nasal spray Place 2 sprays into both nostrils as needed for allergies or rhinitis. 48 mL 3   hydrochlorothiazide (HYDRODIURIL) 25 MG tablet Take  1 tablet (25 mg total) by mouth daily. 90 tablet 3   ibuprofen (ADVIL) 800 MG tablet Take 1 tablet (800 mg total) by mouth every 6 (six) hours as needed for moderate pain. 120 tablet 5   Krill Oil 350 MG CAPS Take 350 mg by mouth daily.      losartan (COZAAR) 50 MG tablet Take 2 tablets (100 mg total) by mouth daily. 180 tablet 3   MAGNESIUM PO Take 400 mg by mouth daily.      metoprolol succinate (TOPROL-XL) 100 MG 24 hr tablet Take with or immediately following a meal. 90 tablet 3   Semaglutide, 1 MG/DOSE, (OZEMPIC, 1 MG/DOSE,) 4 MG/3ML SOPN Inject 1 mg into the skin once a week. 3 mL 1   SUPER B COMPLEX/C PO Take 1 capsule by mouth daily.     Syringe/Needle, Disp, (SYRINGE 3CC/20GX1-1/2") 20G X 1-1/2" 3 ML MISC 1 application by Does not apply Dominguez every 14 (fourteen) days. 50 each 5   tadalafil (CIALIS) 20 MG tablet Take 1 tablet (20 mg total) by mouth daily as needed for erectile dysfunction. 10 tablet 11   testosterone cypionate (DEPOTESTOSTERONE CYPIONATE) 200 MG/ML injection Inject 1 mL (200 mg total) into the muscle every 7 (seven) days. 12 mL 1   triamcinolone (KENALOG) 0.1 % Apply 1 application topically 2 (two) times daily. 45 g 5   No current facility-administered medications on file prior to visit.    Past Medical History:  Diagnosis Date   Cancer (HCC)    jaw   Hypertension    RBBB    Hx - no problems   Allergies  Allergen Reactions   Contrast Media [Iodinated Contrast Media] Other (See Comments) and Cough    Uncontrollable gagging/coughing   Lisinopril Cough   Penicillins Other (See Comments)    Did it involve swelling of the face/tongue/throat, SOB, or low BP? Unknown Did it involve sudden or severe rash/hives, skin peeling, or any reaction on the inside of your mouth or nose? Unknown Did you need to seek medical attention at a hospital or doctor's office? Yes When did it last happen? Infantile Reaction     If all above answers are "NO", may proceed with  cephalosporin use. Childhood reaction    Social History   Socioeconomic History   Marital status: Married    Spouse name: Not on file   Number of children: Not on file   Years of education: Not on file   Highest education level: Not on file  Occupational History   Not on file  Tobacco Use   Smoking status: Never   Smokeless tobacco: Current    Types: Dorna Bloom    Last attempt to quit: 08/21/2018  Vaping Use   Vaping status: Never Used  Substance and Sexual Activity   Alcohol use: Yes    Alcohol/week: 28.0 standard drinks of alcohol    Types: 28 Cans of beer per week    Comment: 4 beers/ day   Drug use: No   Sexual activity: Yes  Other Topics  Concern   Not on file  Social History Narrative   Not on file   Social Determinants of Health   Financial Resource Strain: Not on file  Food Insecurity: Not on file  Transportation Needs: Not on file  Physical Activity: Not on file  Stress: Not on file  Social Connections: Not on file    Vitals:   08/16/23 0842  BP: 110/80  Pulse: 82  Resp: 16  Temp: 98.4 F (36.9 C)  SpO2: 97%   Body mass index is 36.81 kg/m.  Physical Exam Vitals and nursing note reviewed.  Constitutional:      General: He is not in acute distress.    Appearance: He is well-developed.  HENT:     Head: Normocephalic and atraumatic.     Mouth/Throat:     Mouth: Mucous membranes are moist.     Comments: Mild black tongue coloration. Eyes:     Conjunctiva/sclera: Conjunctivae normal.  Cardiovascular:     Rate and Rhythm: Normal rate and regular rhythm.     Heart sounds: No murmur heard. Pulmonary:     Effort: Pulmonary effort is normal. No respiratory distress.     Breath sounds: Normal breath sounds.  Abdominal:     Palpations: Abdomen is soft. There is no hepatomegaly or mass.     Tenderness: There is abdominal tenderness in the epigastric area. There is no guarding or rebound.  Musculoskeletal:     Right lower leg: No edema.     Left lower  leg: No edema.  Lymphadenopathy:     Cervical: No cervical adenopathy.  Skin:    General: Skin is warm.     Findings: No erythema or rash.  Neurological:     Mental Status: He is alert and oriented to person, place, and time.     Cranial Nerves: No cranial nerve deficit.     Gait: Gait normal.  Psychiatric:        Mood and Affect: Mood and affect normal.   ASSESSMENT AND PLAN:  Mr. Woodfield was seen today for abdominal pain, nausea, and vomiting.  Lab Results  Component Value Date   WBC 10.5 08/16/2023   HGB 18.0 Repeated and verified X2. (HH) 08/16/2023   HCT 56.0 Repeated and verified X2. (H) 08/16/2023   MCV 93.0 08/16/2023   PLT 312.0 08/16/2023   Lab Results  Component Value Date   LIPASE 104.0 (H) 08/16/2023   Lab Results  Component Value Date   NA 135 08/16/2023   CL 99 08/16/2023   K 4.1 08/16/2023   CO2 28 08/16/2023   BUN 11 08/16/2023   CREATININE 1.15 08/16/2023   GFR 69.59 08/16/2023   CALCIUM 8.9 08/16/2023   ALBUMIN 4.3 02/15/2023   GLUCOSE 94 08/16/2023   Epigastric abdominal pain We discussed possible etiologies, PUD, dyspepsia, pancreatitis among some to consider. PPI started today. Further recommendation will be given according to lab results. Instructed about warning signs.  -     Pantoprazole Sodium; Take 1 tablet (40 mg total) by mouth 2 (two) times daily.  Dispense: 60 tablet; Refill: 1 -     Lipase; Future -     CBC; Future -     Basic metabolic panel; Future  Gastroesophageal reflux disease, unspecified whether esophagitis present Pantoprazole 40 mg twice daily started today. GERD precautions to continue.  -     Pantoprazole Sodium; Take 1 tablet (40 mg total) by mouth 2 (two) times daily.  Dispense: 60 tablet; Refill: 1 -  Ambulatory referral to Gastroenterology  Melena We discussed diagnosis and prognosis. Protonix 40 mg twice daily started today. Clearly instructed about warning signs. GI referral placed. Further  recommendation will be given according to lab results.  -     Ambulatory referral to Gastroenterology  Nausea and vomiting in adult One episode of vomiting yesterday, not sure about color/content, described as "acidic." Zofran 4 mg 3 times daily as needed recommended for symptomatic treatment. He was clearly instructed about warning signs.  -     Ondansetron HCl; Take 1 tablet (4 mg total) by mouth every 8 (eight) hours as needed for up to 3 days for nausea or vomiting.  Dispense: 9 tablet; Refill: 0  I spent a total of 41 minutes in both face to face and non face to face activities for this visit on the date of this encounter. During this time history was obtained and documented, examination was performed, prior labs reviewed, and assessment/plan discussed. Lipase and H/H elevated. Pt was contacted with lab results and referred to the ER for acute pancreatitis, he declined to go because he is feeling better and has a high deductible. He is asking to have necessary work up done next week. He was clearly instructed about warning signs. Stop Semaglutide. He is allergic to IV contrast, abdomina CT with oral contrast will be arranged asap, repeat CBC and lipase Monday. See lab results documentation for details.  Return in about 2 weeks (around 08/30/2023) for GI bleed with PCP if not able to see GI at that time.Trula Ore, acting as a scribe for Jaileen Janelle [place], MD., have documented all relevant documentation on the behalf of Micheal Roger [place], MD, as directed by  Stepfon Rawles [place], MD while in the presence of Lillieanna Tuohy [place], MD.   I, Guerin Lashomb [place], MD, have reviewed all documentation for this visit. The documentation on 08/16/23 for the exam, diagnosis, procedures, and orders are all accurate and complete.  Mikea Quadros G. [place], MD  [facility]. [place] office.

## 2023-08-16 NOTE — Telephone Encounter (Signed)
CRITICAL VALUE STICKER  CRITICAL VALUE: Hemoglobin 18.0   RECEIVER (on-site recipient of call):Rudene Poulsen, CMA  DATE & TIME NOTIFIED: 10/11 306pm  MESSENGER (representative from lab): Henderson Cloud  MD NOTIFIED: Yes  TIME OF NOTIFICATION: 310pm  RESPONSE: in basket.

## 2023-08-16 NOTE — Telephone Encounter (Signed)
See result note.  

## 2023-08-19 ENCOUNTER — Ambulatory Visit
Admission: RE | Admit: 2023-08-19 | Discharge: 2023-08-19 | Disposition: A | Discharge status: Home / Self Care | Payer: BC Managed Care – PPO | Source: Ambulatory Visit | Attending: Family Medicine | Admitting: Family Medicine

## 2023-08-19 ENCOUNTER — Other Ambulatory Visit (INDEPENDENT_AMBULATORY_CARE_PROVIDER_SITE_OTHER): Payer: BC Managed Care – PPO

## 2023-08-19 DIAGNOSIS — R1013 Epigastric pain: Secondary | ICD-10-CM | POA: Diagnosis not present

## 2023-08-19 DIAGNOSIS — R112 Nausea with vomiting, unspecified: Secondary | ICD-10-CM

## 2023-08-19 DIAGNOSIS — R748 Abnormal levels of other serum enzymes: Secondary | ICD-10-CM | POA: Diagnosis not present

## 2023-08-19 LAB — BASIC METABOLIC PANEL
BUN: 11 mg/dL (ref 6–23)
CO2: 27 meq/L (ref 19–32)
Calcium: 8.5 mg/dL (ref 8.4–10.5)
Chloride: 101 meq/L (ref 96–112)
Creatinine, Ser: 1.14 mg/dL (ref 0.40–1.50)
GFR: 70.32 mL/min (ref >60.00)
Glucose, Bld: 104 mg/dL — ABNORMAL HIGH (ref 70–99)
Potassium: 3.6 meq/L (ref 3.5–5.1)
Sodium: 135 meq/L (ref 135–145)

## 2023-08-19 LAB — CBC WITH DIFFERENTIAL/PLATELET
Basophils Absolute: 0.1 10*3/uL (ref 0.0–0.1)
Basophils Relative: 0.9 % (ref 0.0–3.0)
Eosinophils Absolute: 0.3 10*3/uL (ref 0.0–0.7)
Eosinophils Relative: 4 % (ref 0.0–5.0)
HCT: 53.1 % — ABNORMAL HIGH (ref 39.0–52.0)
Hemoglobin: 17.7 g/dL — ABNORMAL HIGH (ref 13.0–17.0)
Lymphocytes Relative: 20.5 % (ref 12.0–46.0)
Lymphs Abs: 1.6 10*3/uL (ref 0.7–4.0)
MCHC: 33.3 g/dL (ref 30.0–36.0)
MCV: 91.4 fL (ref 78.0–100.0)
Monocytes Absolute: 0.9 10*3/uL (ref 0.1–1.0)
Monocytes Relative: 12.2 % — ABNORMAL HIGH (ref 3.0–12.0)
Neutro Abs: 4.8 10*3/uL (ref 1.4–7.7)
Neutrophils Relative %: 62.4 % (ref 43.0–77.0)
Platelets: 327 10*3/uL (ref 150.0–400.0)
RBC: 5.81 Mil/uL (ref 4.22–5.81)
RDW: 12.9 % (ref 11.5–15.5)
WBC: 7.7 10*3/uL (ref 4.0–10.5)

## 2023-08-19 LAB — HEPATIC FUNCTION PANEL
ALT: 56 U/L — ABNORMAL HIGH (ref 0–53)
AST: 43 U/L — ABNORMAL HIGH (ref 0–37)
Albumin: 4.1 g/dL (ref 3.5–5.2)
Alkaline Phosphatase: 59 U/L (ref 39–117)
Bilirubin, Direct: 0.1 mg/dL (ref 0.0–0.3)
Total Bilirubin: 0.6 mg/dL (ref 0.2–1.2)
Total Protein: 7.4 g/dL (ref 6.0–8.3)

## 2023-08-19 LAB — C-REACTIVE PROTEIN: CRP: 1.5 mg/dL (ref 0.5–20.0)

## 2023-08-19 LAB — AMYLASE: Amylase: 54 U/L (ref 27–131)

## 2023-08-19 LAB — LIPASE: Lipase: 59 U/L (ref 11.0–59.0)

## 2023-08-20 ENCOUNTER — Other Ambulatory Visit: Payer: Self-pay | Admitting: Family Medicine

## 2023-08-20 ENCOUNTER — Inpatient Hospital Stay
Admission: RE | Admit: 2023-08-20 | Discharge: 2023-08-20 | Discharge status: Home / Self Care | Payer: BC Managed Care – PPO | Source: Ambulatory Visit | Attending: Family Medicine | Admitting: Family Medicine

## 2023-08-20 ENCOUNTER — Encounter: Payer: Self-pay | Admitting: Family Medicine

## 2023-08-20 DIAGNOSIS — K219 Gastro-esophageal reflux disease without esophagitis: Secondary | ICD-10-CM

## 2023-08-20 DIAGNOSIS — N289 Disorder of kidney and ureter, unspecified: Secondary | ICD-10-CM | POA: Diagnosis not present

## 2023-08-20 DIAGNOSIS — R112 Nausea with vomiting, unspecified: Secondary | ICD-10-CM | POA: Diagnosis not present

## 2023-08-20 DIAGNOSIS — R748 Abnormal levels of other serum enzymes: Secondary | ICD-10-CM

## 2023-08-20 DIAGNOSIS — R1013 Epigastric pain: Secondary | ICD-10-CM

## 2023-08-20 DIAGNOSIS — R109 Unspecified abdominal pain: Secondary | ICD-10-CM | POA: Diagnosis not present

## 2023-08-20 DIAGNOSIS — K921 Melena: Secondary | ICD-10-CM

## 2023-08-20 DIAGNOSIS — N2889 Other specified disorders of kidney and ureter: Secondary | ICD-10-CM

## 2023-08-20 DIAGNOSIS — K5730 Diverticulosis of large intestine without perforation or abscess without bleeding: Secondary | ICD-10-CM | POA: Diagnosis not present

## 2023-08-23 ENCOUNTER — Telehealth: Payer: Self-pay

## 2023-08-23 NOTE — Telephone Encounter (Signed)
Pharmacy Patient Advocate Encounter  Received notification from [facility] that Prior Authorization for Pantoprazole 40mg  has been APPROVED from 08/20/2023 to 08/19/2024   PA #/Case ID/Reference #: [id number]

## 2023-08-27 ENCOUNTER — Other Ambulatory Visit: Payer: Self-pay | Admitting: Internal Medicine

## 2023-08-27 DIAGNOSIS — E119 Type 2 diabetes mellitus without complications: Secondary | ICD-10-CM

## 2023-08-27 MED ORDER — OZEMPIC (2 MG/DOSE) 8 MG/3ML ~~LOC~~ SOPN: 2.0000 mg | PEN_INJECTOR | SUBCUTANEOUS | 5 refills | Status: DC

## 2023-08-27 NOTE — Addendum Note (Signed)
Addended by: Cheree Ditto on: 08/27/2023 10:11 AM   Modules accepted: Orders

## 2023-08-28 ENCOUNTER — Ambulatory Visit (HOSPITAL_BASED_OUTPATIENT_CLINIC_OR_DEPARTMENT_OTHER)
Admission: RE | Admit: 2023-08-28 | Discharge: 2023-08-28 | Disposition: A | Discharge status: Home / Self Care | Payer: BC Managed Care – PPO | Source: Ambulatory Visit | Attending: Family Medicine | Admitting: Family Medicine

## 2023-08-28 DIAGNOSIS — N2889 Other specified disorders of kidney and ureter: Secondary | ICD-10-CM

## 2023-08-28 DIAGNOSIS — K5730 Diverticulosis of large intestine without perforation or abscess without bleeding: Secondary | ICD-10-CM | POA: Diagnosis not present

## 2023-09-02 ENCOUNTER — Other Ambulatory Visit: Payer: Self-pay | Admitting: Family Medicine

## 2023-09-02 DIAGNOSIS — N2889 Other specified disorders of kidney and ureter: Secondary | ICD-10-CM

## 2023-09-02 NOTE — Addendum Note (Signed)
Addended by: Kathreen Devoid on: 09/02/2023 01:38 PM   Modules accepted: Orders

## 2023-09-04 ENCOUNTER — Encounter: Payer: Self-pay | Admitting: Family Medicine

## 2023-09-04 NOTE — Telephone Encounter (Signed)
I called the reading room, they will send for it to be read.

## 2023-09-06 NOTE — Telephone Encounter (Signed)
See result note.  

## 2023-09-12 ENCOUNTER — Encounter: Payer: Self-pay | Admitting: Family Medicine

## 2023-09-12 DIAGNOSIS — K219 Gastro-esophageal reflux disease without esophagitis: Secondary | ICD-10-CM

## 2023-09-12 DIAGNOSIS — R1013 Epigastric pain: Secondary | ICD-10-CM

## 2023-09-13 MED ORDER — PANTOPRAZOLE SODIUM 40 MG PO TBEC: 40.0000 mg | DELAYED_RELEASE_TABLET | Freq: Two times a day (BID) | ORAL | 1 refills | Status: DC

## 2023-09-13 NOTE — Telephone Encounter (Signed)
Done

## 2023-09-18 ENCOUNTER — Other Ambulatory Visit (HOSPITAL_COMMUNITY): Payer: BC Managed Care – PPO

## 2023-09-23 ENCOUNTER — Encounter: Payer: Self-pay | Admitting: Internal Medicine

## 2023-09-28 ENCOUNTER — Other Ambulatory Visit: Payer: Self-pay | Admitting: Internal Medicine

## 2023-09-28 DIAGNOSIS — E119 Type 2 diabetes mellitus without complications: Secondary | ICD-10-CM

## 2023-10-01 ENCOUNTER — Other Ambulatory Visit (HOSPITAL_COMMUNITY): Payer: BC Managed Care – PPO

## 2023-10-11 ENCOUNTER — Telehealth: Payer: Self-pay | Admitting: Family Medicine

## 2023-10-11 DIAGNOSIS — M1712 Unilateral primary osteoarthritis, left knee: Secondary | ICD-10-CM | POA: Diagnosis not present

## 2023-10-11 NOTE — Telephone Encounter (Signed)
Surgical Clearance form to be filled out--placed in dr's folder.  Fax to [phone] upon completion.

## 2023-10-14 ENCOUNTER — Telehealth: Payer: Self-pay | Admitting: *Deleted

## 2023-10-14 NOTE — Telephone Encounter (Signed)
   Name: Micheal Dominguez  DOB: 20-Jan-1966  MRN: [id number]  Primary Cardiologist: Marjo Bicker, MD Procedure: Left total knee arthroplasty   Preoperative team, please contact this patient and set up a phone call appointment for further preoperative risk assessment. Please obtain consent and complete medication review. Thank you for your help.  I also confirmed the patient resides in the state of [place]. As per [facility] Medical Board telemedicine laws, the patient must reside in the state in which the provider is licensed.  Rip Harbour, NP 10/14/2023, 9:50 AM [facility]

## 2023-10-14 NOTE — Telephone Encounter (Signed)
   Pre-operative Risk Assessment    Patient Name: BRAUN BARNO  DOB: 09-Aug-1966 MRN: [id number]  DATE OF LAST VISIT: 07/31/23 DR. MALLIPEDDI DATE OF NEXT VISIT: NONE    Request for Surgical Clearance    Procedure:   LEFT  TOTAL KNEE ARTHROPLASTY  Date of Surgery:  Clearance TBD                                 Surgeon:  DR. Samson Frederic Surgeon's Group or Practice Name:  [facility] Phone number:  [phone] KERRI MAZE Fax number:  [phone]   Type of Clearance Requested:   - Medical ; NONE INDICATED TO BE HELD   Type of Anesthesia:  Spinal   Additional requests/questions:    Elpidio Anis   10/14/2023, 9:19 AM

## 2023-10-15 ENCOUNTER — Telehealth: Payer: Self-pay | Admitting: *Deleted

## 2023-10-15 NOTE — Telephone Encounter (Signed)
Patient is returning call.  °

## 2023-10-15 NOTE — Telephone Encounter (Signed)
Lvm for pt to call office to set up tele appt.

## 2023-10-15 NOTE — Telephone Encounter (Signed)
Pt is scheduled for tele visit. Med rec and consent done. Will send to surgeons office to update and remove from pool.     Patient Consent for Virtual Visit         Micheal Dominguez has provided verbal consent on 10/15/2023 for a virtual visit (video or telephone).   CONSENT FOR VIRTUAL VISIT FOR:  Micheal Dominguez  By participating in this virtual visit I agree to the following:  I hereby voluntarily request, consent and authorize [facility] and its employed or contracted physicians, physician assistants, nurse practitioners or other licensed health care professionals (the Practitioner), to provide me with telemedicine health care services (the "Services") as deemed necessary by the treating Practitioner. I acknowledge and consent to receive the Services by the Practitioner via telemedicine. I understand that the telemedicine visit will involve communicating with the Practitioner through live audiovisual communication technology and the disclosure of certain medical information by electronic transmission. I acknowledge that I have been given the opportunity to request an in-person assessment or other available alternative prior to the telemedicine visit and am voluntarily participating in the telemedicine visit.  I understand that I have the right to withhold or withdraw my consent to the use of telemedicine in the course of my care at any time, without affecting my right to future care or treatment, and that the Practitioner or I may terminate the telemedicine visit at any time. I understand that I have the right to inspect all information obtained and/or recorded in the course of the telemedicine visit and may receive copies of available information for a reasonable fee.  I understand that some of the potential risks of receiving the Services via telemedicine include:  Delay or interruption in medical evaluation due to technological equipment failure or disruption; Information transmitted  may not be sufficient (e.g. poor resolution of images) to allow for appropriate medical decision making by the Practitioner; and/or  In rare instances, security protocols could fail, causing a breach of personal health information.  Furthermore, I acknowledge that it is my responsibility to provide information about my medical history, conditions and care that is complete and accurate to the best of my ability. I acknowledge that Practitioner's advice, recommendations, and/or decision may be based on factors not within their control, such as incomplete or inaccurate data provided by me or distortions of diagnostic images or specimens that may result from electronic transmissions. I understand that the practice of medicine is not an exact science and that Practitioner makes no warranties or guarantees regarding treatment outcomes. I acknowledge that a copy of this consent can be made available to me via my patient portal [facility] MyChart), or I can request a printed copy by calling the office of [facility].    I understand that my insurance will be billed for this visit.   I have read or had this consent read to me. I understand the contents of this consent, which adequately explains the benefits and risks of the Services being provided via telemedicine.  I have been provided ample opportunity to ask questions regarding this consent and the Services and have had my questions answered to my satisfaction. I give my informed consent for the services to be provided through the use of telemedicine in my medical care

## 2023-10-17 ENCOUNTER — Encounter: Payer: Self-pay | Admitting: Pharmacist

## 2023-10-17 DIAGNOSIS — N486 Induration penis plastica: Secondary | ICD-10-CM | POA: Diagnosis not present

## 2023-10-17 DIAGNOSIS — N529 Male erectile dysfunction, unspecified: Secondary | ICD-10-CM | POA: Diagnosis not present

## 2023-10-17 DIAGNOSIS — E119 Type 2 diabetes mellitus without complications: Secondary | ICD-10-CM

## 2023-10-17 NOTE — Telephone Encounter (Signed)
Pt surgical form received and placed on Dr Clent Ridges red folder

## 2023-10-18 DIAGNOSIS — Z0279 Encounter for issue of other medical certificate: Secondary | ICD-10-CM

## 2023-10-18 MED ORDER — OZEMPIC (2 MG/DOSE) 8 MG/3ML ~~LOC~~ SOPN: 2.0000 mg | PEN_INJECTOR | SUBCUTANEOUS | 5 refills | Status: DC

## 2023-10-21 NOTE — Telephone Encounter (Signed)
Pt surgical clearance completed and faxed as per the form request. Copy sent to scanning

## 2023-11-07 ENCOUNTER — Ambulatory Visit: Payer: BC Managed Care – PPO | Attending: Nurse Practitioner | Admitting: Nurse Practitioner

## 2023-11-07 ENCOUNTER — Encounter: Payer: Self-pay | Admitting: Nurse Practitioner

## 2023-11-07 DIAGNOSIS — Z01810 Encounter for preprocedural cardiovascular examination: Secondary | ICD-10-CM

## 2023-11-07 NOTE — Progress Notes (Addendum)
 Virtual Visit via Telephone Note   Because of Micheal Dominguez co-morbid illnesses, he is at least at moderate risk for complications without adequate follow up.  This format is felt to be most appropriate for this patient at this time.  The patient did not have access to video technology/had technical difficulties with video requiring transitioning to audio format only (telephone).  All issues noted in this document were discussed and addressed.  No physical exam could be performed with this format.  Please refer to the patient's chart for his consent to telehealth for [facility].  Evaluation Performed:  Preoperative cardiovascular risk assessment _____________   Date:  11/07/2023   Patient ID:  Micheal Dominguez, DOB Jun 05, 1966, MRN [id number] Patient Location:  Home Provider location:   Office  Primary Care Provider:  Johnny Garnette LABOR, MD Primary Cardiologist:  Diannah SHAUNNA Maywood, MD  Chief Complaint / Patient Profile   58 y.o. y/o male with a h/o hypertension, family history CAD, obesity, dyslipidemia, type 2 DM,  who is pending left total knee arthroplasty with Dr. Fidel on date TBD and presents today for telephonic preoperative cardiovascular risk assessment.  History of Present Illness    Micheal Dominguez is a 58 y.o. male who presents via audio/video conferencing for a telehealth visit today.  Pt was last seen in cardiology clinic on 07/31/2023 by Dr. Mallipeddi.  At that time [name] was doing well.  The patient is now pending procedure as outlined above. Since his last visit, he denies chest pain, shortness of breath, lower extremity edema, fatigue, palpitations, melena, hematuria, hemoptysis, diaphoresis, weakness, presyncope, syncope, orthopnea, and PND. He remains active with regular yard work, golf, and work outs each morning which include weight lifting and callisthenic exercises and does not have any concerning cardiac symptoms.   Past Medical History     Past Medical History:  Diagnosis Date   Cancer (HCC)    jaw   Hypertension    RBBB    Hx - no problems   Past Surgical History:  Procedure Laterality Date   COLONOSCOPY  06/08/2016   per Dr. Abran, no polyps, has diverticulosis and int. hemorrhoids, repeat in 10 yrs    KNEE ARTHROSCOPY     right   KNEE ARTHROSCOPY WITH MEDIAL MENISECTOMY Left 01/21/2019   Procedure: Left knee arthroscopy with medial menisectomy, debridement, chondroplasty;  Surgeon: Heide Ingle, MD;  Location: [facility];  Service: Orthopedics;  Laterality: Left;    KNEE CLOSED REDUCTION Right 10/07/2019   Procedure: CLOSED MANIPULATION KNEE;  Surgeon: Heide Ingle, MD;  Location: [facility];  Service: Orthopedics;  Laterality: Right;    MANDIBLE SURGERY  09/25/2018   cancer- done at [facility]   TOTAL KNEE ARTHROPLASTY Right 08/19/2019   Procedure: TOTAL KNEE ARTHROPLASTY;  Surgeon: Heide Ingle, MD;  Location: [facility];  Service: Orthopedics;  Laterality: Right;     Allergies  Allergies  Allergen Reactions   Contrast Media [Iodinated Contrast Media] Other (See Comments) and Cough    Uncontrollable gagging/coughing   Lisinopril Cough   Penicillins Other (See Comments)    Did it involve swelling of the face/tongue/throat, SOB, or low BP? Unknown Did it involve sudden or severe rash/hives, skin peeling, or any reaction on the inside of your mouth or nose? Unknown Did you need to seek medical attention at a hospital or doctor's office? Yes When did it last happen? Infantile Reaction  If all above answers are "NO", may proceed with cephalosporin use. Childhood reaction    Home Medications    Prior to Admission medications   Medication Sig Start Date End Date Taking? Authorizing Provider  Albuterol  Sulfate (PROAIR  RESPICLICK) 108 (90 Base) MCG/ACT AEPB Inhale 1 puff into the lungs every 4 (four) hours as needed (wheezing or shortness of breath).  02/15/23   Johnny Garnette LABOR, MD  amLODipine  (NORVASC ) 5 MG tablet Take 1 tablet (5 mg total) by mouth daily. 02/15/23   Johnny Garnette LABOR, MD  Ascorbic Acid (VITAMIN C PO) Take 1,000 mg by mouth daily.     [provider]  B-D 3CC LUER-LOK SYR 20GX1 20G X 1 3 ML MISC FOR USE EVERY 14 DAYS WITH INJECTION 04/13/22   Johnny Garnette LABOR, MD  fluticasone  (FLONASE ) 50 MCG/ACT nasal spray Place 2 sprays into both nostrils as needed for allergies or rhinitis. 02/15/23   Johnny Garnette LABOR, MD  hydrochlorothiazide  (HYDRODIURIL ) 25 MG tablet Take 1 tablet (25 mg total) by mouth daily. 02/15/23   Johnny Garnette LABOR, MD  ibuprofen  (ADVIL ) 800 MG tablet Take 1 tablet (800 mg total) by mouth every 6 (six) hours as needed for moderate pain. 11/20/19   Johnny Garnette LABOR, MD  Krill Oil 350 MG CAPS Take 350 mg by mouth daily.     [provider]  losartan  (COZAAR ) 50 MG tablet Take 2 tablets (100 mg total) by mouth daily. 02/15/23   Johnny Garnette LABOR, MD  MAGNESIUM  PO Take 400 mg by mouth daily.     [provider]  metoprolol  succinate (TOPROL -XL) 100 MG 24 hr tablet Take with or immediately following a meal. 02/15/23   Johnny Garnette LABOR, MD  pantoprazole  (PROTONIX ) 40 MG tablet Take 1 tablet (40 mg total) by mouth 2 (two) times daily. 09/13/23   Johnny Garnette LABOR, MD  Semaglutide , 2 MG/DOSE, (OZEMPIC , 2 MG/DOSE,) 8 MG/3ML SOPN Inject 2 mg into the skin once a week. 10/18/23   Mallipeddi, Vishnu P, MD  SUPER B COMPLEX/C PO Take 1 capsule by mouth daily.    [provider]  Syringe/Needle, Disp, (SYRINGE 3CC/20GX1-1/2) 20G X 1-1/2 3 ML MISC 1 application by Does not apply route every 14 (fourteen) days. 01/03/21   Johnny Garnette LABOR, MD  tadalafil  (CIALIS ) 20 MG tablet Take 1 tablet (20 mg total) by mouth daily as needed for erectile dysfunction. 02/15/23   Johnny Garnette LABOR, MD  testosterone  cypionate (DEPOTESTOSTERONE CYPIONATE) 200 MG/ML injection Inject 1 mL (200 mg total) into the muscle every 7 (seven) days. 02/15/23    Johnny Garnette LABOR, MD  triamcinolone  (KENALOG ) 0.1 % Apply 1 application topically 2 (two) times daily. 11/25/20   Johnny Garnette LABOR, MD    Physical Exam    Vital Signs:  Micheal Dominguez does not have vital signs available for review today.  Given telephonic nature of communication, physical exam is limited. AAOx3. NAD. Normal affect.  Speech and respirations are unlabored.  Accessory Clinical Findings    None  Assessment & Plan    1.  Preoperative Cardiovascular Risk Assessment: According to the Revised Cardiac Risk Index (RCRI), his Perioperative Risk of Major Cardiac Event is (%): 0.9. His Functional Capacity in METs is: 8.23 according to the Duke Activity Status Index (DASI). The patient is doing well from a cardiac perspective. Therefore, based on ACC/AHA guidelines, the patient would be at acceptable risk for the planned procedure without further cardiovascular testing.   The patient was  advised that if he develops new symptoms prior to surgery to contact our office to arrange for a follow-up visit, and he verbalized understanding.  No request to hold cardiac medications. Per surgery scheduler, patient reports history of taking aspirin . There is no cardiac limitation to holding aspirin  for 1 week prior to procedure.   A copy of this note will be routed to requesting surgeon.  Time:   Today, I have spent 10 minutes with the patient with telehealth technology discussing medical history, symptoms, and management plan.    Rosaline EMERSON Bane, NP-C  11/07/2023, 9:04 AM [address] Office [phone] Fax [phone]

## 2023-11-08 ENCOUNTER — Other Ambulatory Visit: Payer: Self-pay | Admitting: Family Medicine

## 2023-11-08 DIAGNOSIS — K219 Gastro-esophageal reflux disease without esophagitis: Secondary | ICD-10-CM

## 2023-11-08 DIAGNOSIS — R1013 Epigastric pain: Secondary | ICD-10-CM

## 2023-12-11 ENCOUNTER — Other Ambulatory Visit: Payer: Self-pay | Admitting: Family Medicine

## 2023-12-11 NOTE — Telephone Encounter (Signed)
 Pt LOV was on 08/16/23 Last refill was done on 02/15/23 Please advise

## 2023-12-17 ENCOUNTER — Other Ambulatory Visit (HOSPITAL_COMMUNITY): Payer: Self-pay

## 2023-12-17 ENCOUNTER — Telehealth (HOSPITAL_COMMUNITY): Payer: Self-pay | Admitting: Pharmacy Technician

## 2023-12-17 DIAGNOSIS — E291 Testicular hypofunction: Secondary | ICD-10-CM

## 2023-12-17 NOTE — Telephone Encounter (Signed)
Pharmacy Patient Advocate Encounter   Received notification from  [employer]  that prior authorization for TESOSTERONE CYPIONATE 200MG /ML SOLUTION is required/requested.   Insurance verification completed.   The patient is insured through [facility] .   Per test claim: PA required; However, NEW/RECENT labs/notes are needed to complete & submit PA request. Please see below.  Insurance requires 2 lab values completed in the morning.

## 2023-12-19 ENCOUNTER — Other Ambulatory Visit (HOSPITAL_COMMUNITY): Payer: Self-pay

## 2023-12-23 ENCOUNTER — Encounter: Payer: Self-pay | Admitting: Family Medicine

## 2023-12-23 ENCOUNTER — Ambulatory Visit: Payer: BC Managed Care – PPO | Admitting: Family Medicine

## 2023-12-23 VITALS — BP 120/82 | HR 60 | Temp 97.7°F | Wt 239.0 lb

## 2023-12-23 DIAGNOSIS — E291 Testicular hypofunction: Secondary | ICD-10-CM

## 2023-12-23 DIAGNOSIS — I10 Essential (primary) hypertension: Secondary | ICD-10-CM | POA: Diagnosis not present

## 2023-12-23 DIAGNOSIS — N529 Male erectile dysfunction, unspecified: Secondary | ICD-10-CM | POA: Diagnosis not present

## 2023-12-23 MED ORDER — SILDENAFIL CITRATE 100 MG PO TABS: 100.0000 mg | ORAL_TABLET | Freq: Every day | ORAL | 3 refills | Status: AC | PRN

## 2023-12-23 NOTE — Progress Notes (Signed)
   Subjective:    Patient ID: Micheal Dominguez, male    DOB: 1966-10-19, 59 y.o.   MRN: [id number]  HPI Here to follow up on ED, hypogonadism, and HTN. His BP is stable at home. He is pleased with alternating Tadalafil with Sildenafil. We are going through the PA process with his insurance company about covering the testosterone treatments. He feels well.    Review of Systems  Constitutional: Negative.   Respiratory: Negative.    Cardiovascular: Negative.        Objective:   Physical Exam Constitutional:      Appearance: Normal appearance.  Cardiovascular:     Rate and Rhythm: Normal rate and regular rhythm.     Pulses: Normal pulses.     Heart sounds: Normal heart sounds.  Pulmonary:     Effort: Pulmonary effort is normal.     Breath sounds: Normal breath sounds.  Neurological:     Mental Status: He is alert.           Assessment & Plan:  His ED and hypogonadism are stable. His HTN is well controlled.  Gershon Crane, MD

## 2023-12-23 NOTE — Telephone Encounter (Signed)
Please start the PA

## 2023-12-23 NOTE — Telephone Encounter (Signed)
 Insurance requires 2 early morning baseline serum total or free serum testosterone lab results. Patient only has 1 early morning test result. Please advise.

## 2023-12-24 ENCOUNTER — Ambulatory Visit: Payer: BC Managed Care – PPO | Admitting: Internal Medicine

## 2023-12-26 ENCOUNTER — Encounter: Payer: Self-pay | Admitting: Internal Medicine

## 2023-12-26 ENCOUNTER — Ambulatory Visit: Payer: BC Managed Care – PPO | Admitting: Internal Medicine

## 2023-12-26 VITALS — BP 124/80 | HR 71 | Ht 67.5 in | Wt 238.0 lb

## 2023-12-26 DIAGNOSIS — K920 Hematemesis: Secondary | ICD-10-CM

## 2023-12-26 DIAGNOSIS — R1010 Upper abdominal pain, unspecified: Secondary | ICD-10-CM | POA: Diagnosis not present

## 2023-12-26 NOTE — Patient Instructions (Signed)
 You have been scheduled for an endoscopy. Please follow written instructions given to you at your visit today.  If you use inhalers (even only as needed), please bring them with you on the day of your procedure.  If you take any of the following medications, they will need to be adjusted prior to your procedure:   DO NOT TAKE 7 DAYS PRIOR TO TEST- Trulicity (dulaglutide) Ozempic, Wegovy (semaglutide) Mounjaro (tirzepatide) Bydureon Bcise (exanatide extended release)  DO NOT TAKE 1 DAY PRIOR TO YOUR TEST Rybelsus (semaglutide) Adlyxin (lixisenatide) Victoza (liraglutide) Byetta (exanatide) ___________________________________________________________________________   If your blood pressure at your visit was 140/90 or greater, please contact your primary care physician to follow up on this.  _______________________________________________________  If you are age 56 or older, your body mass index should be between 23-30. Your Body mass index is 36.73 kg/m. If this is out of the aforementioned range listed, please consider follow up with your Primary Care Provider.  If you are age 54 or younger, your body mass index should be between 19-25. Your Body mass index is 36.73 kg/m. If this is out of the aformentioned range listed, please consider follow up with your Primary Care Provider.   ________________________________________________________  The [facility] providers would like to encourage you to use [facility] to communicate with providers for non-urgent requests or questions.  Due to long hold times on the telephone, sending your provider a message by [facility] may be a faster and more efficient way to get a response.  Please allow 48 business hours for a response.  Please remember that this is for non-urgent requests.  _______________________________________________________

## 2023-12-26 NOTE — Progress Notes (Signed)
 HISTORY OF PRESENT ILLNESS:  Micheal Dominguez is a 58 y.o. male, Technical sales engineer at [employer], who is sent today by his primary care provider regarding upper abdominal pain and hematemesis.  I saw the patient on 1 occasion June 08, 2016 for screening colonoscopy.  He was found to have diverticulosis and internal hemorrhoids.  Otherwise normal.  Follow-up in 10 years recommended.  Patient tells me that in October 2024 he was experiencing significant left upper quadrant pain.  He developed nausea and vomiting with scant hematemesis.  He saw his PCP who placed him on pantoprazole.  Within a week, his pain resolved.  No further problems.  He had been using NSAIDs periodically.  Not recently.  He takes Ozempic.  No problems with heartburn or indigestion prior to PPI.  No dysphagia.  His weight has been stable (despite Ozempic).  He does describe a chronic abdominal fullness sensation.  He did undergo CT scan of the abdomen and pelvis without contrast August 28, 2023.  No acute abnormalities.  Review of blood work from August 19, 2023 shows unremarkable comprehensive metabolic panel except for AST of 43 and ALT of 56.  Liver test 6 months prior were normal.  CBC revealed hemoglobin of 17.7.  REVIEW OF SYSTEMS:  All non-GI ROS negative unless otherwise stated in the HPI. Past Medical History:  Diagnosis Date   Cancer (HCC)    jaw   GERD (gastroesophageal reflux disease)    Hypertension    RBBB    Hx - no problems   Seasonal allergies     Past Surgical History:  Procedure Laterality Date   COLONOSCOPY  06/08/2016   per Dr. Marina Goodell, no polyps, has diverticulosis and int. hemorrhoids, repeat in 10 yrs    KNEE ARTHROSCOPY     right   KNEE ARTHROSCOPY WITH MEDIAL MENISECTOMY Left 01/21/2019   Procedure: Left knee arthroscopy with medial menisectomy, debridement, chondroplasty;  Surgeon: Ranee Gosselin, MD;  Location: [facility];  Service: Orthopedics;  Laterality: Left;    KNEE  CLOSED REDUCTION Right 10/07/2019   Procedure: CLOSED MANIPULATION KNEE;  Surgeon: Ranee Gosselin, MD;  Location: [facility] ;  Service: Orthopedics;  Laterality: Right;    MANDIBLE SURGERY  09/25/2018   cancer- done at [facility]   TOTAL KNEE ARTHROPLASTY Right 08/19/2019   Procedure: TOTAL KNEE ARTHROPLASTY;  Surgeon: Ranee Gosselin, MD;  Location: [facility];  Service: Orthopedics;  Laterality: Right;     Social History MYKING SAR  reports that he has never smoked. His smokeless tobacco use includes chew. He reports current alcohol use of about 28.0 standard drinks of alcohol per week. He reports that he does not use drugs.  family history includes Breast cancer in his mother; Cancer in his brother; Diabetes in his father, maternal grandfather, maternal grandmother, mother, and paternal grandmother; Heart disease in his father, maternal grandfather, mother, and paternal grandfather; Hyperlipidemia in his father and mother; Hypertension in his father and mother; Lung cancer in his father; Prostate cancer in his father; Stroke in his maternal grandfather.  Allergies  Allergen Reactions   Contrast Media [Iodinated Contrast Media] Other (See Comments) and Cough    Uncontrollable gagging/coughing   Lisinopril Cough   Penicillins Other (See Comments)    Did it involve swelling of the face/tongue/throat, SOB, or low BP? Unknown Did it involve sudden or severe rash/hives, skin peeling, or any reaction on the inside of your mouth or nose? Unknown Did  you need to seek medical attention at a hospital or doctor's office? Yes When did it last happen? Infantile Reaction     If all above answers are "NO", may proceed with cephalosporin use. Childhood reaction       PHYSICAL EXAMINATION: Vital signs: BP 124/80 (BP Location: Left Arm, Patient Position: Sitting, Cuff Size: Large)   Pulse 71   Ht 5' 7.5" (1.715 m)   Wt 238 lb (108 kg)   BMI 36.73 kg/m    Constitutional: generally well-appearing, no acute distress Psychiatric: alert and oriented x3, cooperative Eyes: extraocular movements intact, anicteric, conjunctiva pink Mouth: oral pharynx moist, no lesions Neck: supple no lymphadenopathy Cardiovascular: heart regular rate and rhythm, no murmur Lungs: clear to auscultation bilaterally Abdomen: soft, nontender, nondistended, no obvious ascites, no peritoneal signs, normal bowel sounds, no organomegaly Rectal: Omitted Extremities: no clubbing, cyanosis, or lower extremity edema bilaterally Skin: no lesions on visible extremities Neuro: No focal deficits.  Cranial nerves intact  ASSESSMENT:  1.  Prior history of upper abdominal pain and hematemesis.  Improved after PPI.  Rule out ulcer.  Rule out other upper GI mucosal lesion. 2.  Screening colonoscopy August 2017 with diverticulosis and hemorrhoids   PLAN:  1.  Continue PPI 2.  Minimize NSAIDs 3.  Schedule upper endoscopy to evaluate abdominal pain and hematemesis.The nature of the procedure, as well as the risks, benefits, and alternatives were carefully and thoroughly reviewed with the patient. Ample time for discussion and questions allowed. The patient understood, was satisfied, and agreed to proceed. 4.  Repeat screening colonoscopy around August 2027 5.  Ongoing general medical care with Dr. Clent Ridges A total time of 45 minutes was spent preparing to see the patient, reviewing a myriad of data, obtaining comprehensive history, performing medically appropriate physical examination, counseling and educating patient regarding the above listed issues, ordering endoscopic procedure, and documenting clinical information in the health record

## 2024-01-01 NOTE — Telephone Encounter (Signed)
 Ok to place an early Testosterone order for PA approval

## 2024-01-02 NOTE — Addendum Note (Signed)
 Addended by: Gershon Crane A on: 01/02/2024 07:56 AM   Modules accepted: Orders

## 2024-01-02 NOTE — Telephone Encounter (Signed)
 Left  pt a detailed message to call the office and schedule an early lab appointment for testosterone recheck

## 2024-01-02 NOTE — Telephone Encounter (Signed)
 I placed the lab order. Please start the PA

## 2024-01-03 ENCOUNTER — Encounter: Payer: Self-pay | Admitting: Family Medicine

## 2024-01-06 NOTE — Telephone Encounter (Signed)
 Pt has appointment for CPE on 02/18/24

## 2024-01-09 ENCOUNTER — Encounter: Payer: Self-pay | Admitting: Internal Medicine

## 2024-01-17 ENCOUNTER — Ambulatory Visit: Payer: BC Managed Care – PPO | Admitting: Internal Medicine

## 2024-01-17 ENCOUNTER — Encounter: Payer: Self-pay | Admitting: Internal Medicine

## 2024-01-17 VITALS — BP 151/82 | HR 60 | Temp 97.7°F | Resp 13 | Ht 67.5 in | Wt 238.0 lb

## 2024-01-17 DIAGNOSIS — K920 Hematemesis: Secondary | ICD-10-CM

## 2024-01-17 DIAGNOSIS — K449 Diaphragmatic hernia without obstruction or gangrene: Secondary | ICD-10-CM

## 2024-01-17 DIAGNOSIS — K2950 Unspecified chronic gastritis without bleeding: Secondary | ICD-10-CM | POA: Diagnosis not present

## 2024-01-17 DIAGNOSIS — K2951 Unspecified chronic gastritis with bleeding: Secondary | ICD-10-CM | POA: Diagnosis not present

## 2024-01-17 DIAGNOSIS — R1010 Upper abdominal pain, unspecified: Secondary | ICD-10-CM

## 2024-01-17 DIAGNOSIS — B9681 Helicobacter pylori [H. pylori] as the cause of diseases classified elsewhere: Secondary | ICD-10-CM | POA: Diagnosis not present

## 2024-01-17 DIAGNOSIS — K2970 Gastritis, unspecified, without bleeding: Secondary | ICD-10-CM

## 2024-01-17 HISTORY — PX: ESOPHAGOGASTRODUODENOSCOPY: SHX1529

## 2024-01-17 MED ORDER — SODIUM CHLORIDE 0.9 % IV SOLN: 500.0000 mL | INTRAVENOUS | Status: DC

## 2024-01-17 NOTE — Op Note (Signed)
 [facility] Patient Name: Micheal Dominguez Procedure Date: 01/17/2024 10:11 AM MRN: [id number] Endoscopist: Wilhemina Bonito. Marina Goodell , MD, [id number] Age: 58 Referring MD:  Date of Birth: 13-May-1966 Gender: Male Account #: [id number] Procedure:                Upper GI endoscopy with biopsies Indications:              Abdominal pain in the left upper quadrant,                            Hematemesis. Both about 6 months ago. Has been on                            PPI. Seen in the office last month. Has been doing                            well. Medicines:                Monitored Anesthesia Care Procedure:                Pre-Anesthesia Assessment:                           - Prior to the procedure, a History and Physical                            was performed, and patient medications and                            allergies were reviewed. The patient's tolerance of                            previous anesthesia was also reviewed. The risks                            and benefits of the procedure and the sedation                            options and risks were discussed with the patient.                            All questions were answered, and informed consent                            was obtained. Prior Anticoagulants: The patient has                            taken no anticoagulant or antiplatelet agents.                            After reviewing the risks and benefits, the patient                            was deemed in satisfactory condition to undergo the  procedure.                           After obtaining informed consent, the endoscope was                            passed under direct vision. Throughout the                            procedure, the patient's blood pressure, pulse, and                            oxygen saturations were monitored continuously. The                            Olympus Scope [id number] was introduced through the                             mouth, and advanced to the second part of duodenum.                            The upper GI endoscopy was accomplished without                            difficulty. The patient tolerated the procedure                            well. Scope In: Scope Out: Findings:                 The esophagus was normal.                           The stomach was normal except for mild gastric                            erythema and a small hiatal hernia. Biopsies were                            taken with a cold forceps for histology to rule out                            H. pylori.                           The examined duodenum was normal.                           The cardia and gastric fundus were normal on                            retroflexion. Complications:            No immediate complications. Estimated Blood Loss:     Estimated blood loss: none. Impression:               - Normal esophagus.                           -  Normal stomach. Biopsied.                           - Normal examined duodenum. Recommendation:           - Patient has a contact number available for                            emergencies. The signs and symptoms of potential                            delayed complications were discussed with the                            patient. Return to normal activities tomorrow.                            Written discharge instructions were provided to the                            patient.                           - Resume previous diet.                           - Continue present medications. Wilhemina Bonito. Marina Goodell, MD 01/17/2024 10:28:54 AM This report has been signed electronically.

## 2024-01-17 NOTE — Patient Instructions (Signed)
-  Await pathology results -Continue present medications  YOU HAD AN ENDOSCOPIC PROCEDURE TODAY AT THE [facility]:   Refer to the procedure report that was given to you for any specific questions about what was found during the examination.  If the procedure report does not answer your questions, please call your gastroenterologist to clarify.  If you requested that your care partner not be given the details of your procedure findings, then the procedure report has been included in a sealed envelope for you to review at your convenience later.  YOU SHOULD EXPECT: Some feelings of bloating in the abdomen. Passage of more gas than usual.  Walking can help get rid of the air that was put into your GI tract during the procedure and reduce the bloating. If you had a lower endoscopy (such as a colonoscopy or flexible sigmoidoscopy) you may notice spotting of blood in your stool or on the toilet paper. If you underwent a bowel prep for your procedure, you may not have a normal bowel movement for a few days.  Please Note:  You might notice some irritation and congestion in your nose or some drainage.  This is from the oxygen used during your procedure.  There is no need for concern and it should clear up in a day or so.  SYMPTOMS TO REPORT IMMEDIATELY:  Following upper endoscopy (EGD)  Vomiting of blood or coffee ground material  New chest pain or pain under the shoulder blades  Painful or persistently difficult swallowing  New shortness of breath  Fever of 100F or higher  Black, tarry-looking stools  For urgent or emergent issues, a gastroenterologist can be reached at any hour by calling [phone]. Do not use MyChart messaging for urgent concerns.    DIET:  We do recommend a small meal at first, but then you may proceed to your regular diet.  Drink plenty of fluids but you should avoid alcoholic beverages for 24 hours.  ACTIVITY:  You should plan to take it easy for the rest of  today and you should NOT DRIVE or use heavy machinery until tomorrow (because of the sedation medicines used during the test).    FOLLOW UP: Our staff will call the number listed on your records the next business day following your procedure.  We will call around 7:15- 8:00 am to check on you and address any questions or concerns that you may have regarding the information given to you following your procedure. If we do not reach you, we will leave a message.     If any biopsies were taken you will be contacted by phone or by letter within the next 1-3 weeks.  Please call us at [phone] if you have not heard about the biopsies in 3 weeks.    SIGNATURES/CONFIDENTIALITY: You and/or your care partner have signed paperwork which will be entered into your electronic medical record.  These signatures attest to the fact that that the information above on your After Visit Summary has been reviewed and is understood.  Full responsibility of the confidentiality of this discharge information lies with you and/or your care-partner.

## 2024-01-17 NOTE — Progress Notes (Signed)
 Expand All Collapse All HISTORY OF PRESENT ILLNESS:   Micheal Dominguez is a 58 y.o. male, Technical sales engineer at [employer], who is sent today by his primary care provider regarding upper abdominal pain and hematemesis.  I saw the patient on 1 occasion June 08, 2016 for screening colonoscopy.  He was found to have diverticulosis and internal hemorrhoids.  Otherwise normal.  Follow-up in 10 years recommended.   Patient tells me that in October 2024 he was experiencing significant left upper quadrant pain.  He developed nausea and vomiting with scant hematemesis.  He saw his PCP who placed him on pantoprazole.  Within a week, his pain resolved.  No further problems.  He had been using NSAIDs periodically.  Not recently.  He takes Ozempic.  No problems with heartburn or indigestion prior to PPI.  No dysphagia.  His weight has been stable (despite Ozempic).  He does describe a chronic abdominal fullness sensation.  He did undergo CT scan of the abdomen and pelvis without contrast August 28, 2023.  No acute abnormalities.   Review of blood work from August 19, 2023 shows unremarkable comprehensive metabolic panel except for AST of 43 and ALT of 56.  Liver test 6 months prior were normal.  CBC revealed hemoglobin of 17.7.   REVIEW OF SYSTEMS:   All non-GI ROS negative unless otherwise stated in the HPI.     Past Medical History:  Diagnosis Date   Cancer (HCC)      jaw   GERD (gastroesophageal reflux disease)     Hypertension     RBBB      Hx - no problems   Seasonal allergies                 Past Surgical History:  Procedure Laterality Date   COLONOSCOPY   06/08/2016    per Dr. Marina Goodell, no polyps, has diverticulosis and int. hemorrhoids, repeat in 10 yrs    KNEE ARTHROSCOPY        right   KNEE ARTHROSCOPY WITH MEDIAL MENISECTOMY Left 01/21/2019    Procedure: Left knee arthroscopy with medial menisectomy, debridement, chondroplasty;  Surgeon: Ranee Gosselin, MD;  Location: [facility];  Service: Orthopedics;  Laterality: Left;    KNEE CLOSED REDUCTION Right 10/07/2019    Procedure: CLOSED MANIPULATION KNEE;  Surgeon: Ranee Gosselin, MD;  Location: [facility];  Service: Orthopedics;  Laterality: Right;    MANDIBLE SURGERY   09/25/2018    cancer- done at [facility]   TOTAL KNEE ARTHROPLASTY Right 08/19/2019    Procedure: TOTAL KNEE ARTHROPLASTY;  Surgeon: Ranee Gosselin, MD;  Location: [facility];  Service: Orthopedics;  Laterality: Right;           Social History [name]  reports that he has never smoked. His smokeless tobacco use includes chew. He reports current alcohol use of about 28.0 standard drinks of alcohol per week. He reports that he does not use drugs.   family history includes Breast cancer in his mother; Cancer in his brother; Diabetes in his father, maternal grandfather, maternal grandmother, mother, and paternal grandmother; Heart disease in his father, maternal grandfather, mother, and paternal grandfather; Hyperlipidemia in his father and mother; Hypertension in his father and mother; Lung cancer in his father; Prostate cancer in his father; Stroke in his maternal grandfather.   Allergies       Allergies  Allergen Reactions   Contrast Media [Iodinated Contrast Media] Other (  See Comments) and Cough      Uncontrollable gagging/coughing   Lisinopril Cough   Penicillins Other (See Comments)      Did it involve swelling of the face/tongue/throat, SOB, or low BP? Unknown Did it involve sudden or severe rash/hives, skin peeling, or any reaction on the inside of your mouth or nose? Unknown Did you need to seek medical attention at a hospital or doctor's office? Yes When did it last happen? Infantile Reaction     If all above answers are "NO", may proceed with cephalosporin use. Childhood reaction            PHYSICAL EXAMINATION: Vital signs: BP 124/80 (BP Location: Left Arm, Patient Position:  Sitting, Cuff Size: Large)   Pulse 71   Ht 5' 7.5" (1.715 m)   Wt 238 lb (108 kg)   BMI 36.73 kg/m   Constitutional: generally well-appearing, no acute distress Psychiatric: alert and oriented x3, cooperative Eyes: extraocular movements intact, anicteric, conjunctiva pink Mouth: oral pharynx moist, no lesions Neck: supple no lymphadenopathy Cardiovascular: heart regular rate and rhythm, no murmur Lungs: clear to auscultation bilaterally Abdomen: soft, nontender, nondistended, no obvious ascites, no peritoneal signs, normal bowel sounds, no organomegaly Rectal: Omitted Extremities: no clubbing, cyanosis, or lower extremity edema bilaterally Skin: no lesions on visible extremities Neuro: No focal deficits.  Cranial nerves intact   ASSESSMENT:   1.  Prior history of upper abdominal pain and hematemesis.  Improved after PPI.  Rule out ulcer.  Rule out other upper GI mucosal lesion. 2.  Screening colonoscopy August 2017 with diverticulosis and hemorrhoids     PLAN:   1.  Continue PPI 2.  Minimize NSAIDs 3.  Schedule upper endoscopy to evaluate abdominal pain and hematemesis.The nature of the procedure, as well as the risks, benefits, and alternatives were carefully and thoroughly reviewed with the patient. Ample time for discussion and questions allowed. The patient understood, was satisfied, and agreed to proceed. 4.  Repeat screening colonoscopy around August 2027 5.  Ongoing general medical care with Dr. Clent Ridges

## 2024-01-17 NOTE — Progress Notes (Signed)
 Called to room to assist during endoscopic procedure.  Patient ID and intended procedure confirmed with present staff. Received instructions for my participation in the procedure from the performing physician.

## 2024-01-17 NOTE — Progress Notes (Signed)
 Sedate, gd SR, tolerated procedure well, VSS, report to RN

## 2024-01-20 ENCOUNTER — Telehealth: Payer: Self-pay | Admitting: *Deleted

## 2024-01-20 NOTE — Telephone Encounter (Signed)
 No answer on  follow up call. Left message.

## 2024-01-21 ENCOUNTER — Encounter: Payer: Self-pay | Admitting: Internal Medicine

## 2024-01-21 ENCOUNTER — Other Ambulatory Visit: Payer: Self-pay

## 2024-01-21 DIAGNOSIS — C069 Malignant neoplasm of mouth, unspecified: Secondary | ICD-10-CM | POA: Diagnosis not present

## 2024-01-21 LAB — SURGICAL PATHOLOGY

## 2024-01-21 MED ORDER — BISMUTH/METRONIDAZ/TETRACYCLIN 140-125-125 MG PO CAPS: 3.0000 | ORAL_CAPSULE | Freq: Four times a day (QID) | ORAL | 0 refills | Status: DC

## 2024-01-21 MED ORDER — PANTOPRAZOLE SODIUM 40 MG PO TBEC: 40.0000 mg | DELAYED_RELEASE_TABLET | Freq: Two times a day (BID) | ORAL | 0 refills | Status: DC

## 2024-01-23 ENCOUNTER — Other Ambulatory Visit: Payer: Self-pay | Admitting: Internal Medicine

## 2024-01-24 ENCOUNTER — Telehealth: Payer: Self-pay

## 2024-01-24 ENCOUNTER — Other Ambulatory Visit (HOSPITAL_COMMUNITY): Payer: Self-pay

## 2024-01-24 NOTE — Telephone Encounter (Signed)
 PA request has been Submitted. New Encounter has been or will be created for follow up. For additional info see Pharmacy Prior Auth telephone encounter from 01-24-2024.

## 2024-01-24 NOTE — Telephone Encounter (Signed)
 Pharmacy Patient Advocate Encounter  Received notification from [facility] that Prior Authorization for  Pylera 140-125-125MG  capsules has been DENIED.  Full denial letter will be uploaded to the media tab. See denial reason below.  This medication is not on the formulary. This medication is covered for members when:  -The member has tried an A-rated generic equivalent, bismuth subcitrate-metronidazole-tetracycline combination pack, and had a life-threatening side effect requiring medical intervention that did not occur with the brand equivalent. Provider must complete and submit a Food and Drug Administration (FDA) MedWatch Adverse Event Reporting Form on behalf of the member, and -The member must try and fail (did not work), or be unable to take ALL formulary alternatives (due to interactions, side effects, etc.).  PA #/Case ID/Reference #: [id number]

## 2024-01-24 NOTE — Telephone Encounter (Signed)
 Some part of this H Pylori "combo" requires a PA

## 2024-01-24 NOTE — Telephone Encounter (Signed)
 Pharmacy Patient Advocate Encounter   Received notification from RX Request Messages that prior authorization for Pylera 140-125-125MG  capsules is required/requested.   Insurance verification completed.   The patient is insured through [facility] .   Per test claim: PA required; PA submitted to above mentioned insurance via CoverMyMeds Key/confirmation #/EOC [id number] Status is pending

## 2024-01-27 ENCOUNTER — Other Ambulatory Visit: Payer: Self-pay

## 2024-01-27 MED ORDER — METRONIDAZOLE 250 MG PO TABS: 250.0000 mg | ORAL_TABLET | Freq: Four times a day (QID) | ORAL | 0 refills | Status: DC

## 2024-01-27 MED ORDER — DOXYCYCLINE HYCLATE 100 MG PO CAPS: 100.0000 mg | ORAL_CAPSULE | Freq: Two times a day (BID) | ORAL | 0 refills | Status: DC

## 2024-01-27 MED ORDER — BISMUTH 262 MG PO CHEW: 524.0000 mg | CHEWABLE_TABLET | Freq: Four times a day (QID) | ORAL | 0 refills | Status: AC

## 2024-01-27 NOTE — Telephone Encounter (Signed)
 New prescriptions sent to the pharmacy for the pt to tx h pylori. Mychart message sent to the pt.

## 2024-01-27 NOTE — Telephone Encounter (Signed)
 Not sure if you saw this denial

## 2024-02-18 ENCOUNTER — Encounter: Payer: Self-pay | Admitting: Family Medicine

## 2024-02-18 ENCOUNTER — Ambulatory Visit (INDEPENDENT_AMBULATORY_CARE_PROVIDER_SITE_OTHER): Payer: BC Managed Care – PPO | Admitting: Family Medicine

## 2024-02-18 VITALS — BP 118/70 | HR 81 | Temp 98.2°F | Ht 67.0 in | Wt 240.0 lb

## 2024-02-18 DIAGNOSIS — Z23 Encounter for immunization: Secondary | ICD-10-CM | POA: Diagnosis not present

## 2024-02-18 DIAGNOSIS — Z0000 Encounter for general adult medical examination without abnormal findings: Secondary | ICD-10-CM

## 2024-02-18 DIAGNOSIS — I10 Essential (primary) hypertension: Secondary | ICD-10-CM

## 2024-02-18 DIAGNOSIS — Z131 Encounter for screening for diabetes mellitus: Secondary | ICD-10-CM

## 2024-02-18 DIAGNOSIS — Z125 Encounter for screening for malignant neoplasm of prostate: Secondary | ICD-10-CM

## 2024-02-18 LAB — CBC WITH DIFFERENTIAL/PLATELET
Basophils Absolute: 0.1 10*3/uL (ref 0.0–0.1)
Basophils Relative: 1 % (ref 0.0–3.0)
Eosinophils Absolute: 0.3 10*3/uL (ref 0.0–0.7)
Eosinophils Relative: 3.9 % (ref 0.0–5.0)
HCT: 49.3 % (ref 39.0–52.0)
Hemoglobin: 16.9 g/dL (ref 13.0–17.0)
Lymphocytes Relative: 24.2 % (ref 12.0–46.0)
Lymphs Abs: 1.9 10*3/uL (ref 0.7–4.0)
MCHC: 34.3 g/dL (ref 30.0–36.0)
MCV: 92.2 fl (ref 78.0–100.0)
Monocytes Absolute: 0.8 10*3/uL (ref 0.1–1.0)
Monocytes Relative: 10.5 % (ref 3.0–12.0)
Neutro Abs: 4.8 10*3/uL (ref 1.4–7.7)
Neutrophils Relative %: 60.4 % (ref 43.0–77.0)
Platelets: 290 10*3/uL (ref 150.0–400.0)
RBC: 5.34 Mil/uL (ref 4.22–5.81)
RDW: 15.3 % (ref 11.5–15.5)
WBC: 7.9 10*3/uL (ref 4.0–10.5)

## 2024-02-18 LAB — TESTOSTERONE: Testosterone: 796.5 ng/dL (ref 300.00–890.00)

## 2024-02-18 LAB — TSH: TSH: 2.39 u[IU]/mL (ref 0.35–5.50)

## 2024-02-18 LAB — LIPID PANEL
Cholesterol: 156 mg/dL (ref 0–200)
HDL: 48 mg/dL (ref >39.00)
LDL Cholesterol: 88 mg/dL (ref 0–99)
NonHDL: 108.46
Total CHOL/HDL Ratio: 3
Triglycerides: 102 mg/dL (ref 0.0–149.0)
VLDL: 20.4 mg/dL (ref 0.0–40.0)

## 2024-02-18 LAB — HEPATIC FUNCTION PANEL
ALT: 23 U/L (ref 0–53)
AST: 24 U/L (ref 0–37)
Albumin: 4.4 g/dL (ref 3.5–5.2)
Alkaline Phosphatase: 68 U/L (ref 39–117)
Bilirubin, Direct: 0.3 mg/dL (ref 0.0–0.3)
Total Bilirubin: 1.6 mg/dL — ABNORMAL HIGH (ref 0.2–1.2)
Total Protein: 6.9 g/dL (ref 6.0–8.3)

## 2024-02-18 LAB — BASIC METABOLIC PANEL WITH GFR
BUN: 12 mg/dL (ref 6–23)
CO2: 28 meq/L (ref 19–32)
Calcium: 8.8 mg/dL (ref 8.4–10.5)
Chloride: 101 meq/L (ref 96–112)
Creatinine, Ser: 1.04 mg/dL (ref 0.40–1.50)
GFR: 78.23 mL/min (ref >60.00)
Glucose, Bld: 104 mg/dL — ABNORMAL HIGH (ref 70–99)
Potassium: 3.5 meq/L (ref 3.5–5.1)
Sodium: 137 meq/L (ref 135–145)

## 2024-02-18 LAB — HEMOGLOBIN A1C: Hgb A1c MFr Bld: 5.5 % (ref 4.6–6.5)

## 2024-02-18 LAB — PSA: PSA: 3.76 ng/mL (ref 0.10–4.00)

## 2024-02-18 MED ORDER — TESTOSTERONE CYPIONATE 200 MG/ML IM SOLN: 200.0000 mg | INTRAMUSCULAR | 1 refills | Status: DC

## 2024-02-18 MED ORDER — AMLODIPINE BESYLATE 5 MG PO TABS: 5.0000 mg | ORAL_TABLET | Freq: Every day | ORAL | 3 refills | Status: AC

## 2024-02-18 MED ORDER — HYDROCHLOROTHIAZIDE 25 MG PO TABS: 25.0000 mg | ORAL_TABLET | Freq: Every day | ORAL | 3 refills | Status: AC

## 2024-02-18 MED ORDER — LOSARTAN POTASSIUM 50 MG PO TABS
100.0000 mg | ORAL_TABLET | Freq: Every day | ORAL | 3 refills | Status: AC
Start: 2024-02-18 — End: ?

## 2024-02-18 MED ORDER — TADALAFIL 20 MG PO TABS: 20.0000 mg | ORAL_TABLET | Freq: Every day | ORAL | 11 refills | Status: DC | PRN

## 2024-02-18 NOTE — Progress Notes (Addendum)
 Subjective:    Patient ID: Micheal Dominguez, male    DOB: 08/30/1966, 58 y.o.   MRN: [id number]  HPI Here for a well exam. He feels well other than his knee pain. He has received gel shots and they have provided some relief. He hopes to postpone his surgery as long as possible. He was treated for H pylori related gastritis last October, and this is well managed with Pantoprazole. He still deals with some erection issues, despite using testosterone and Tadalfil. He asks if any of his HTN medications could be affecting this. His BP at home has been stable in the 110-120/70 range.    Review of Systems  Constitutional: Negative.   HENT: Negative.    Eyes: Negative.   Respiratory: Negative.    Cardiovascular: Negative.   Gastrointestinal: Negative.   Genitourinary: Negative.   Musculoskeletal:  Positive for arthralgias.  Skin: Negative.   Neurological: Negative.   Psychiatric/Behavioral: Negative.         Objective:   Physical Exam Constitutional:      General: He is not in acute distress.    Appearance: He is well-developed. He is obese. He is not diaphoretic.  HENT:     Head: Normocephalic and atraumatic.     Right Ear: External ear normal.     Left Ear: External ear normal.     Nose: Nose normal.     Mouth/Throat:     Pharynx: No oropharyngeal exudate.  Eyes:     General: No scleral icterus.       Right eye: No discharge.        Left eye: No discharge.     Conjunctiva/sclera: Conjunctivae normal.     Pupils: Pupils are equal, round, and reactive to light.  Neck:     Thyroid: No thyromegaly.     Vascular: No JVD.     Trachea: No tracheal deviation.  Cardiovascular:     Rate and Rhythm: Normal rate and regular rhythm.     Pulses: Normal pulses.     Heart sounds: Normal heart sounds. No murmur heard.    No friction rub. No gallop.  Pulmonary:     Effort: Pulmonary effort is normal. No respiratory distress.     Breath sounds: Normal breath sounds. No wheezing or rales.   Chest:     Chest wall: No tenderness.  Abdominal:     General: Bowel sounds are normal. There is no distension.     Palpations: Abdomen is soft. There is no mass.     Tenderness: There is no abdominal tenderness. There is no guarding or rebound.  Genitourinary:    Penis: Normal. No tenderness.      Testes: Normal.     Prostate: Normal.     Rectum: Normal. Guaiac result negative.  Musculoskeletal:        General: No tenderness. Normal range of motion.     Cervical back: Neck supple.  Lymphadenopathy:     Cervical: No cervical adenopathy.  Skin:    General: Skin is warm and dry.     Coloration: Skin is not pale.     Findings: No erythema or rash.  Neurological:     General: No focal deficit present.     Mental Status: He is alert and oriented to person, place, and time.     Cranial Nerves: No cranial nerve deficit.     Motor: No abnormal muscle tone.     Coordination: Coordination normal.     Deep  Tendon Reflexes: Reflexes are normal and symmetric. Reflexes normal.  Psychiatric:        Mood and Affect: Mood normal.        Behavior: Behavior normal.        Thought Content: Thought content normal.        Judgment: Judgment normal.           Assessment & Plan:  Well exam. We discussed diet and exercise. Get fasting labs. We agreed to hlpd the Metoprolol for 2 weeks to see if this improves his erections. He will monitor the BP and report back in 2 weeks.  Corita Diego, MD

## 2024-02-18 NOTE — Addendum Note (Signed)
 Addended by: Philbert Brave on: 02/18/2024 09:40 AM   Modules accepted: Orders

## 2024-02-26 DIAGNOSIS — C069 Malignant neoplasm of mouth, unspecified: Secondary | ICD-10-CM | POA: Diagnosis not present

## 2024-02-26 DIAGNOSIS — K1321 Leukoplakia of oral mucosa, including tongue: Secondary | ICD-10-CM | POA: Diagnosis not present

## 2024-02-26 DIAGNOSIS — C760 Malignant neoplasm of head, face and neck: Secondary | ICD-10-CM | POA: Diagnosis not present

## 2024-03-11 DIAGNOSIS — K1321 Leukoplakia of oral mucosa, including tongue: Secondary | ICD-10-CM | POA: Diagnosis not present

## 2024-03-11 DIAGNOSIS — C060 Malignant neoplasm of cheek mucosa: Secondary | ICD-10-CM | POA: Diagnosis not present

## 2024-03-25 ENCOUNTER — Other Ambulatory Visit: Payer: Self-pay | Admitting: Family Medicine

## 2024-03-25 DIAGNOSIS — I10 Essential (primary) hypertension: Secondary | ICD-10-CM

## 2024-04-18 ENCOUNTER — Other Ambulatory Visit: Payer: Self-pay | Admitting: Family Medicine

## 2024-04-18 DIAGNOSIS — K219 Gastro-esophageal reflux disease without esophagitis: Secondary | ICD-10-CM

## 2024-04-18 DIAGNOSIS — R1013 Epigastric pain: Secondary | ICD-10-CM

## 2024-04-20 NOTE — Telephone Encounter (Signed)
 Last filled by Dr. Elvin Hammer. Ok to fill?

## 2024-05-06 ENCOUNTER — Telehealth: Payer: Self-pay

## 2024-05-06 ENCOUNTER — Telehealth: Payer: Self-pay | Admitting: Pharmacy Technician

## 2024-05-06 NOTE — Telephone Encounter (Signed)
 Pharmacy Patient Advocate Encounter   Received notification from CoverMyMeds that prior authorization for Testosterone  Cypionate 200MG /ML intramuscular solution is required/requested.   Insurance verification completed.   The patient is insured through [facility] .   Per test claim: PA required; PA submitted to above mentioned insurance via CoverMyMeds Key/confirmation #/EOC [id number] Status is pending

## 2024-05-06 NOTE — Telephone Encounter (Signed)
    Patient just got 05/02/24

## 2024-05-11 ENCOUNTER — Other Ambulatory Visit (HOSPITAL_COMMUNITY): Payer: Self-pay

## 2024-05-11 NOTE — Telephone Encounter (Signed)
 Pharmacy Patient Advocate Encounter  Received notification from [facility] that Prior Authorization for Testosterone  Cypionate 200MG /ML intramuscular solution  has been APPROVED from 05/06/24 to 05/06/25. Unable to obtain price due to refill too soon rejection, last fill date 05/07/24 next available fill date09/23/25   PA #/Case ID/Reference #: [id number]

## 2024-05-19 DIAGNOSIS — M1712 Unilateral primary osteoarthritis, left knee: Secondary | ICD-10-CM | POA: Diagnosis not present

## 2024-05-26 DIAGNOSIS — M1712 Unilateral primary osteoarthritis, left knee: Secondary | ICD-10-CM | POA: Diagnosis not present

## 2024-06-02 DIAGNOSIS — M1712 Unilateral primary osteoarthritis, left knee: Secondary | ICD-10-CM | POA: Diagnosis not present

## 2024-06-03 ENCOUNTER — Telehealth: Payer: Self-pay

## 2024-06-03 NOTE — Telephone Encounter (Signed)
   Pre-operative Risk Assessment    Patient Name: Micheal Dominguez  DOB: [birth year] MRN: [id number]   Date of last office visit: 07/31/23 DIANNAH MAYWOOD, MD Date of next office visit: NONE   Request for Surgical Clearance    Procedure:  LEFT TOTAL KNEE ARTHROPLASTY  Date of Surgery:  Clearance TBD     (PER REQUEST SEPTEMBER)                           Surgeon:  DR REDELL SHOALS Surgeon's Group or Practice Name:  [facility] Phone number:  [phone] Fax number:  [phone]  ATTN: JOEN WILLS   Type of Clearance Requested:   - Medical    Type of Anesthesia:  Spinal   Additional requests/questions:    SignedLucie DELENA Ku   06/03/2024, 2:10 PM

## 2024-06-03 NOTE — Telephone Encounter (Signed)
   Name: Micheal Dominguez  DOB: [birth year]  MRN: [id number]  Primary Cardiologist: Vishnu P Mallipeddi, MD   Preoperative team, please contact this patient and set up a phone call appointment for further preoperative risk assessment. Please obtain consent and complete medication review. Thank you for your help. Last seen by Rosaline Bane 11/07/2023  I confirm that guidance regarding antiplatelet and oral anticoagulation therapy has been completed and, if necessary, noted below.  Patient is not on anticoagulation or antiplatelet per review of medical record in Epic.    I also confirmed the patient resides in the state of [place] . As per [facility] Medical Board telemedicine laws, the patient must reside in the state in which the provider is licensed.   [name], NP 06/03/2024, 2:13 PM [facility]

## 2024-06-03 NOTE — Telephone Encounter (Signed)
 1st attempt to reach pt regarding surgical clearance and the need for an TELE appointment.  Left pt a detailed message to call back and get that scheduled.

## 2024-06-04 NOTE — Telephone Encounter (Signed)
 2nd attempt to reach patient, left a detailed message for him to call back.

## 2024-06-05 NOTE — Telephone Encounter (Signed)
 3rd attempt: Left message for pt to call our office and ask for the preop team to schedule TELE Preop appt.

## 2024-06-05 NOTE — Telephone Encounter (Signed)
 Will update surgeons office.

## 2024-06-10 DIAGNOSIS — C060 Malignant neoplasm of cheek mucosa: Secondary | ICD-10-CM | POA: Diagnosis not present

## 2024-07-02 ENCOUNTER — Encounter: Payer: Self-pay | Admitting: Internal Medicine

## 2024-07-03 ENCOUNTER — Telehealth: Payer: Self-pay

## 2024-07-03 NOTE — Telephone Encounter (Signed)
 Please stop taking Ozempic  NOW.   Pancreatitis and blurred vision are rare side effects of Ozempic . If these side effects occur, medication should be discontinued immediately. Please have your eyes checked ASAP. Let your eye doctor's office know this. They will see you immediately. This is high priority.   Since you did not tolerate Ozempic , you would not be a candidate for Mounjaro either.   [facility] Triage team, please add Ozempic  under allergy list.    This MyChart message has not been read. Franky LITTIE Finder to P Cv Div [facility] Triage (supporting Diannah SHAUNNA Maywood, MD)     07/02/24  6:46 PM Is mounjaro better for me ? Can you check please   07/02/24  3:03 PM You routed this conversation to Mallipeddi, Diannah SHAUNNA, MD Franky LITTIE Finder to P Cv Div [facility] Triage (supporting Diannah SHAUNNA Maywood, MD)     07/02/24  2:42 PM hello Dr Mallipeddi For several months I have been feeling tired, no energy, I had an episode of pancreatitis last year, my vision is starting to be real blurred to the point that I'm considering an eye exam. Constipation is also a big issue followed by diarrhea Also for the past 4 months J have severe nausea and vomiting mostly in the morning but sometimes in the evening.  Is there another medication that we can switch to with Less of these unpleasant side effects. At this point I don't know if this medication is having any therapeutic efffects on me. Thank you

## 2024-07-17 ENCOUNTER — Other Ambulatory Visit: Payer: Self-pay | Admitting: Family Medicine

## 2024-07-17 DIAGNOSIS — J309 Allergic rhinitis, unspecified: Secondary | ICD-10-CM

## 2024-07-20 ENCOUNTER — Other Ambulatory Visit: Payer: Self-pay | Admitting: Family Medicine

## 2024-07-20 ENCOUNTER — Other Ambulatory Visit: Payer: Self-pay

## 2024-07-20 MED ORDER — PROAIR RESPICLICK 108 (90 BASE) MCG/ACT IN AEPB: 1.0000 | INHALATION_SPRAY | RESPIRATORY_TRACT | 11 refills | Status: DC | PRN

## 2024-07-20 NOTE — Telephone Encounter (Signed)
 Done

## 2024-07-23 ENCOUNTER — Telehealth: Payer: Self-pay

## 2024-07-23 NOTE — Telephone Encounter (Signed)
 1st attempt : Called patient to contact our office to schedule in-office visit for cardiac clearance. NA, left message on VM box.

## 2024-07-23 NOTE — Telephone Encounter (Signed)
   Name: SOHAIL CAPRARO  DOB: 03-May-1966  MRN: [id number]  Primary Cardiologist: Vishnu P Mallipeddi, MD  Chart reviewed as part of pre-operative protocol coverage. Because of MORLEY GAUMER past medical history and time since last visit, he will require a follow-up in-office visit in order to better assess preoperative cardiovascular risk.  Pre-op covering staff: - Please schedule appointment and call patient to inform them. If patient already had an upcoming appointment within acceptable timeframe, please add pre-op clearance to the appointment notes so provider is aware. - Please contact requesting surgeon's office via preferred method (i.e, phone, fax) to inform them of need for appointment prior to surgery.  No medications indicated as needing held.  Orren LOISE Fabry, PA-C  07/23/2024, 10:46 AM

## 2024-07-23 NOTE — Telephone Encounter (Signed)
   Pre-operative Risk Assessment    Patient Name: Micheal Dominguez  DOB: May 07, 1966 MRN: [id number]   Date of last office visit: 07/31/23 Date of next office visit: N/A   Request for Surgical Clearance    Procedure:  left total knee arthroplasty   Date of Surgery:  Clearance TBD                                 Surgeon:  Dr. Redell Shoals  Surgeon's Group or Practice Name:  [facility]  Phone number:  [phone] Fax number:  [phone]   Type of Clearance Requested:   - Medical    Type of Anesthesia:  Spinal   Additional requests/questions:    SignedRebeca Blight   07/23/2024, 10:02 AM

## 2024-07-24 NOTE — Telephone Encounter (Signed)
 LVM asking pt to call our office to schedule OV for preop clearance. Please reach out to preop team if pt returns call. Thank you

## 2024-07-30 NOTE — Telephone Encounter (Signed)
 Called and spoke with patient, and he has decided to postpone surgery for probably sometime at the beginning of the year. Patient advised to contact [facility] when he is ready. He was also advised to contact our office about scheduling a follow up with Dr. Mallipeddi. Patient stated that he will go on MyChart to do so, once he gets his work schedule.

## 2024-08-29 ENCOUNTER — Other Ambulatory Visit: Payer: Self-pay | Admitting: Internal Medicine

## 2024-08-29 DIAGNOSIS — E119 Type 2 diabetes mellitus without complications: Secondary | ICD-10-CM

## 2024-09-04 ENCOUNTER — Telehealth (HOSPITAL_BASED_OUTPATIENT_CLINIC_OR_DEPARTMENT_OTHER): Payer: Self-pay | Admitting: *Deleted

## 2024-09-04 NOTE — Telephone Encounter (Signed)
   Pre-operative Risk Assessment    Patient Name: Micheal Dominguez  DOB: 1966-09-19 MRN: [id number]   Date of last office visit: 07/31/23 DR. MALLIPEDDI Date of next office visit: NONE     SEE ORIGINAL REQUEST 06/03/24  Request for Surgical Clearance    Procedure:  LEFT TOTAL KNEE ARTHROPLASTY  Date of Surgery:  Clearance TBD                                Surgeon:  DR. REDELL SHOALS Surgeon's Group or Practice Name:  [facility] Phone number:  [phone] JOEN SIC Fax number:  [phone]   Type of Clearance Requested:   - Medical    Type of Anesthesia:  Spinal   Additional requests/questions:    Bonney Niels Jest   09/04/2024, 4:07 PM

## 2024-09-07 NOTE — Telephone Encounter (Signed)
 Left message to call back and schedule in office appt. When I reviewed the [facility] sched before calling the pt; there were no openings in [place].    We can offer to the pt if he would like to be seen in [facility] or [address] for preop clearance.

## 2024-09-07 NOTE — Telephone Encounter (Signed)
   Name: Micheal Dominguez  DOB: 1966-04-07  MRN: [id number]  Primary Cardiologist: Vishnu P Mallipeddi, MD  Chart reviewed as part of pre-operative protocol coverage. Because of [name] past medical history and time since last visit, he will require a follow-up in-office visit in order to better assess preoperative cardiovascular risk.  Pre-op covering staff: - Please schedule appointment and call patient to inform them. If patient already had an upcoming appointment within acceptable timeframe, please Micheal pre-op clearance to the appointment notes so provider is aware. - Please contact requesting surgeon's office via preferred method (i.e, phone, fax) to inform them of need for appointment prior to surgery.    [name], NP  09/07/2024, 10:16 AM

## 2024-09-08 NOTE — Telephone Encounter (Signed)
 2nd attempt to reach the pt to schedule an appt in office.

## 2024-09-10 NOTE — Telephone Encounter (Signed)
 3rd attempt to schedule pre op clearance LVMTCB, will remove from pre op pool and update surgeon's office.

## 2024-09-14 ENCOUNTER — Encounter: Payer: Self-pay | Admitting: Internal Medicine

## 2024-10-14 ENCOUNTER — Other Ambulatory Visit: Payer: Self-pay | Admitting: Family Medicine

## 2024-10-27 ENCOUNTER — Other Ambulatory Visit: Payer: Self-pay | Admitting: Family Medicine
